# Patient Record
Sex: Female | Born: 1935 | Race: White | Hispanic: No | Smoking: Never smoker
Health system: Southern US, Community
[De-identification: ages and names within clinical notes are randomized; demographics above are authoritative.]

## PROBLEM LIST (undated history)

## (undated) DIAGNOSIS — M1990 Unspecified osteoarthritis, unspecified site: Secondary | ICD-10-CM

## (undated) DIAGNOSIS — G4700 Insomnia, unspecified: Secondary | ICD-10-CM

## (undated) DIAGNOSIS — M79606 Pain in leg, unspecified: Secondary | ICD-10-CM

## (undated) DIAGNOSIS — N1831 Chronic kidney disease, stage 3a: Secondary | ICD-10-CM

## (undated) DIAGNOSIS — M542 Cervicalgia: Secondary | ICD-10-CM

## (undated) DIAGNOSIS — M549 Dorsalgia, unspecified: Secondary | ICD-10-CM

## (undated) HISTORY — DX: Dorsalgia, unspecified: M54.9

## (undated) HISTORY — DX: Pain in leg, unspecified: M79.606

## (undated) HISTORY — DX: Cervicalgia: M54.2

## (undated) HISTORY — DX: Unspecified osteoarthritis, unspecified site: M19.90

---

## 1998-11-17 ENCOUNTER — Other Ambulatory Visit: Admission: RE | Admit: 1998-11-17 | Discharge: 1998-11-17 | Payer: Self-pay | Admitting: Obstetrics and Gynecology

## 2000-05-16 ENCOUNTER — Other Ambulatory Visit: Admission: RE | Admit: 2000-05-16 | Discharge: 2000-05-16 | Payer: Self-pay | Admitting: Obstetrics and Gynecology

## 2005-11-28 ENCOUNTER — Ambulatory Visit: Payer: Self-pay | Admitting: Internal Medicine

## 2005-12-27 ENCOUNTER — Ambulatory Visit: Payer: Self-pay | Admitting: Internal Medicine

## 2007-08-07 ENCOUNTER — Encounter: Admission: RE | Admit: 2007-08-07 | Discharge: 2007-08-07 | Payer: Self-pay | Admitting: Orthopedic Surgery

## 2008-04-10 ENCOUNTER — Encounter: Admission: RE | Admit: 2008-04-10 | Discharge: 2008-04-10 | Payer: Self-pay | Admitting: Endocrinology

## 2008-09-30 ENCOUNTER — Inpatient Hospital Stay (HOSPITAL_COMMUNITY): Admission: RE | Admit: 2008-09-30 | Discharge: 2008-10-03 | Payer: Self-pay | Admitting: Orthopedic Surgery

## 2009-11-07 IMAGING — CR DG CHEST 2V
2 series · 2 of 2 positions shown · non-contrast
Comparison: None

CLINICAL DATA: .  Preop for osteoarthritis right hip

CHEST - 2 VIEW

[w chest pa]
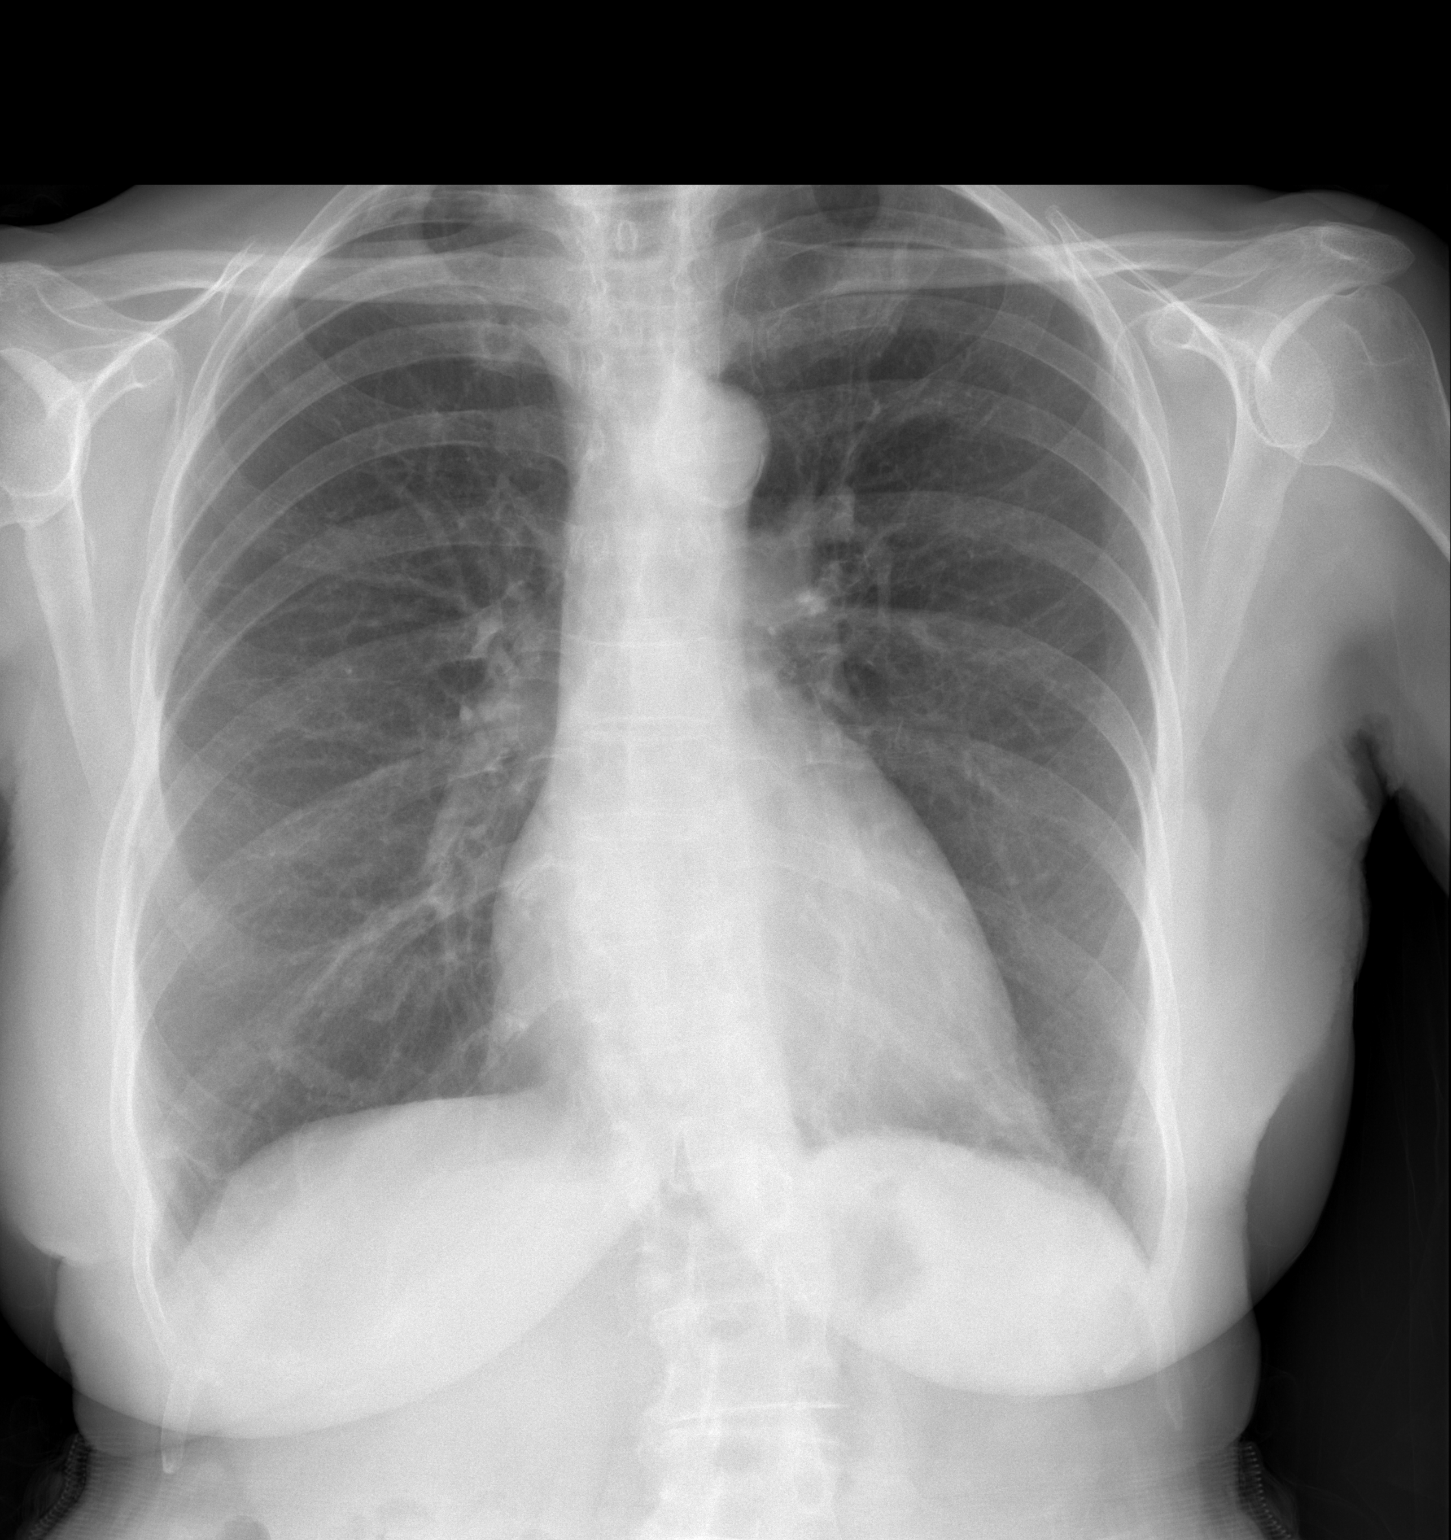

[w chest lat]
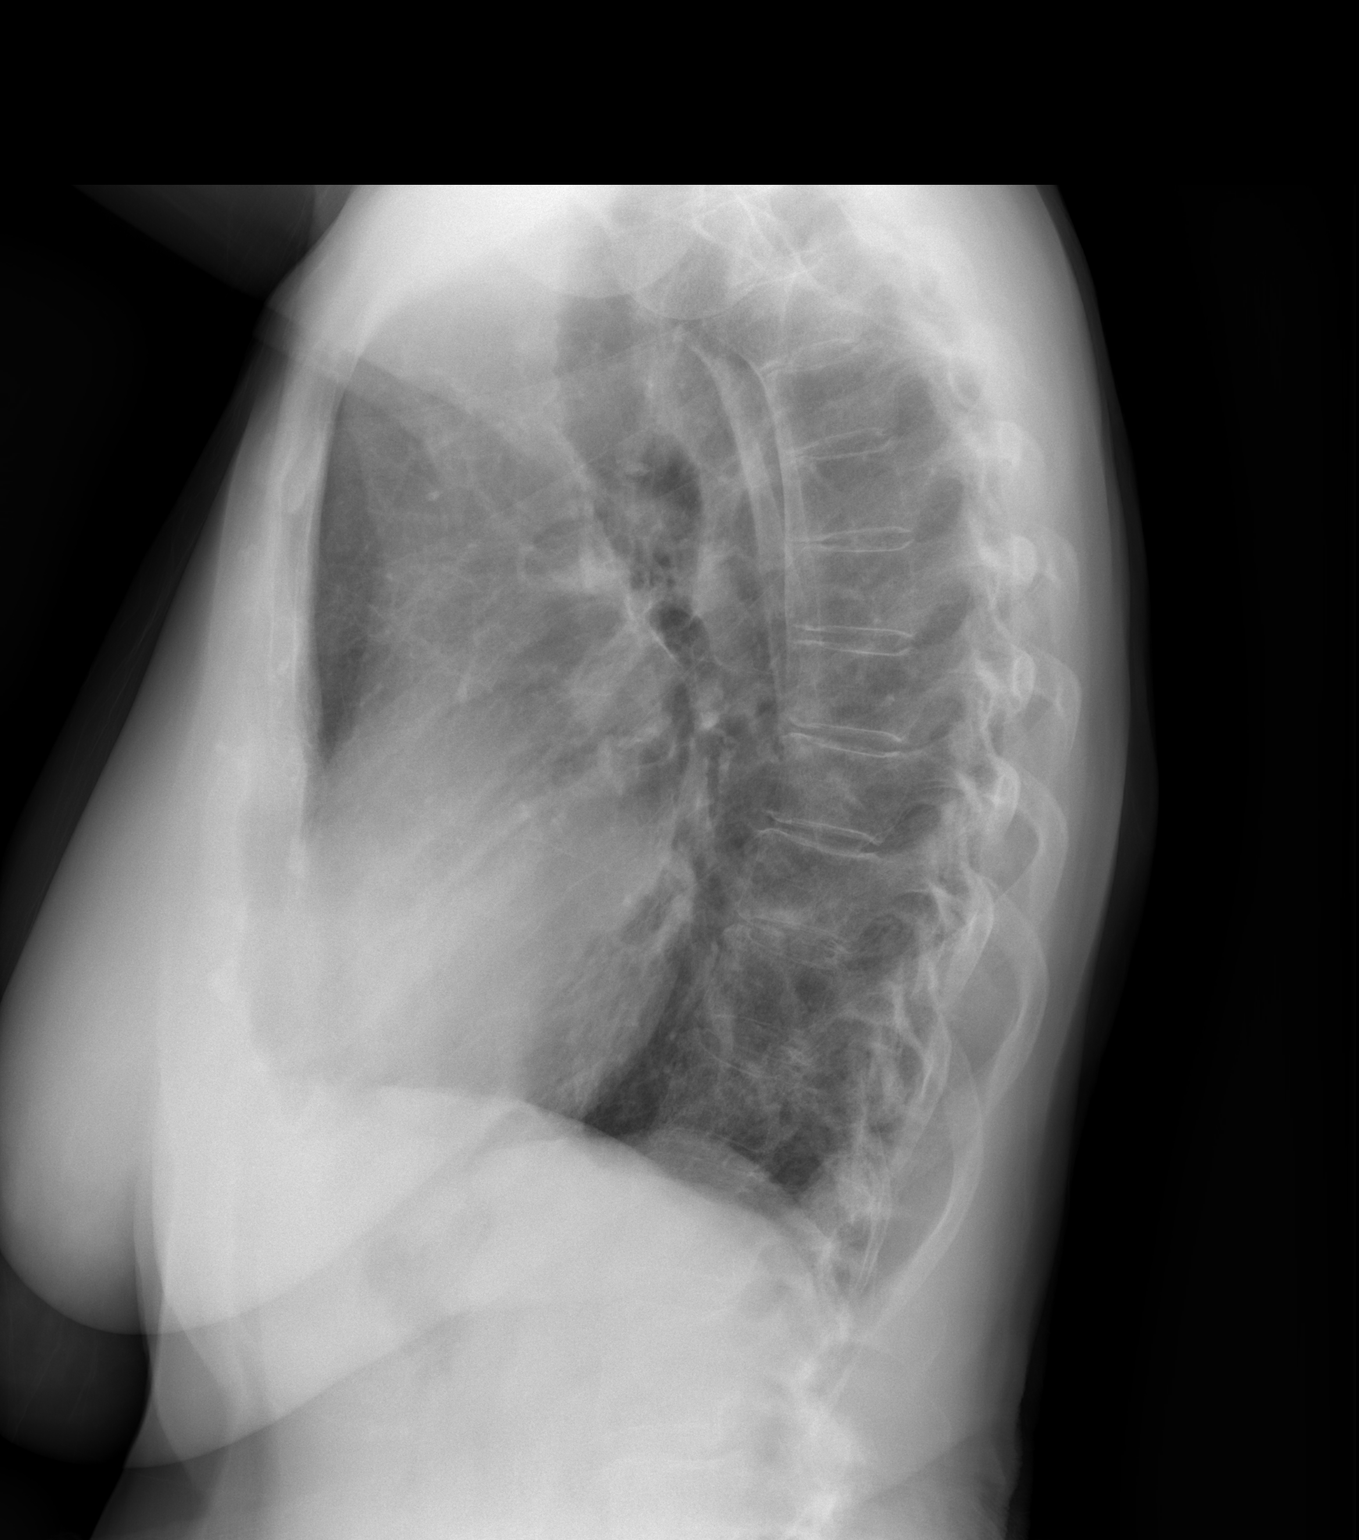

[2 of 2 positions shown; findings below may reference images not displayed]

FINDINGS: The cardiomediastinal silhouette is stable.  No acute
infiltrate or pleural effusion.  No pulmonary edema.  Mild
degenerative changes of the lower thoracic spine are seen.
IMPRESSION: No acute infiltrate or edema.

## 2009-11-13 IMAGING — CR DG PELVIS 1-2V
1 series · 1 of 1 positions shown · non-contrast
Comparison: None

CLINICAL DATA: .  Total right hip replacement

PELVIS - 1-2 VIEW

[view not recorded]
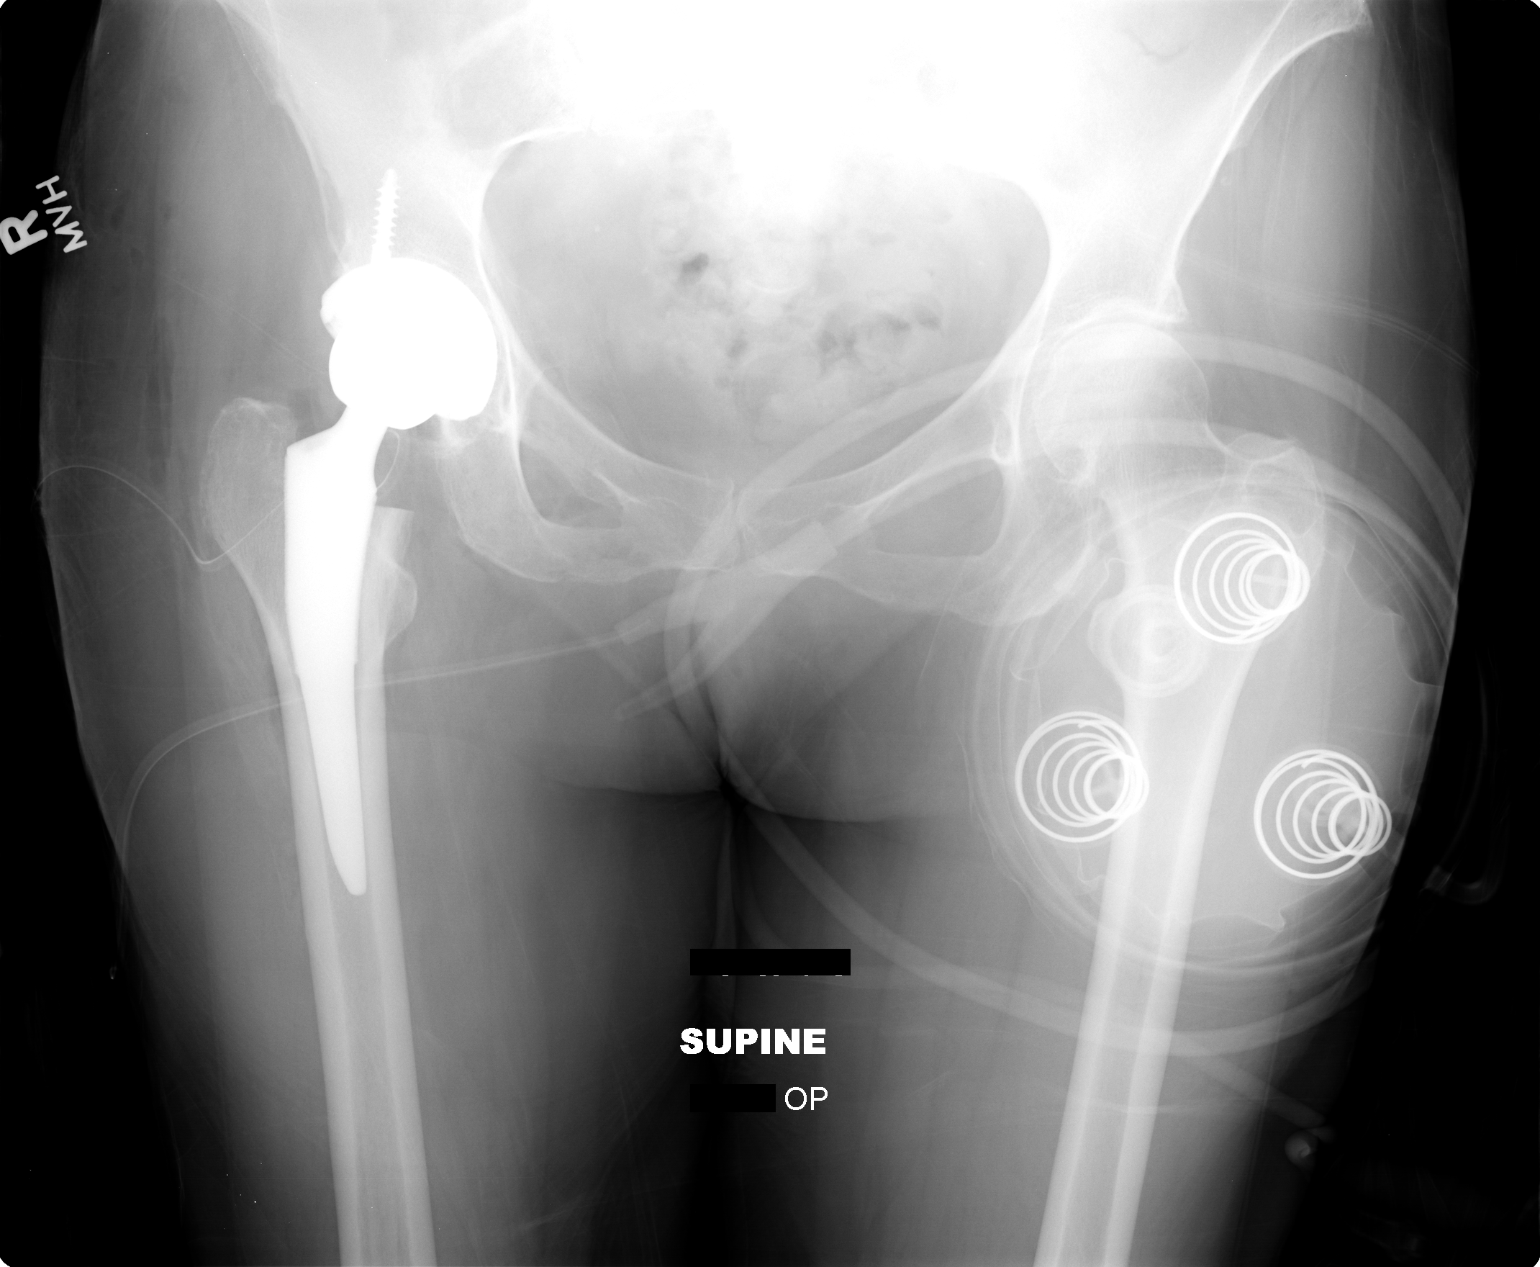

[1 of 1 positions shown; findings below may reference images not displayed]

FINDINGS: Right hip total arthroplasty with the femoral component
and acetabular component well seated.  Surgical drain in place.  No
evidence of fracture.
IMPRESSION: 1.  No evidence of complication following right hip total
arthroplasty.

## 2011-01-02 ENCOUNTER — Encounter: Payer: Self-pay | Admitting: Orthopedic Surgery

## 2011-04-26 NOTE — Op Note (Signed)
NAMEJaselynn, Tamas Hancock                 ACCOUNT NO.:  [id number]   MEDICAL RECORD NO.:  [id number]          PATIENT TYPE:  INP   LOCATION:  1621                         FACILITY:  [facility]   PHYSICIAN:  Madlyn Frankel. Charlann Boxer, M.D.  DATE OF BIRTH:  October 06, 1924   DATE OF PROCEDURE:  09/30/2008  DATE OF DISCHARGE:                               OPERATIVE REPORT   PREOPERATIVE DIAGNOSIS:  Right hip osteoarthritis.   POSTOPERATIVE DIAGNOSIS:  Right hip osteoarthritis.   PROCEDURE:  Right total hip replacement.   COMPONENTS USED:  DePuy hip system size 50 pinnacle cup, single  cancellous screw, 32 +4 Marathon liner, size 3 standard Tri-Lock stem  with 32 +5 ball.   SURGEON:  Madlyn Frankel. Charlann Boxer, M.D.   ASSISTANT:  Dwyane Luo, P.A.-C.   ANESTHESIA:  General.   BLOOD LOSS:  200 mL.   DRAINS:  x1.   SPECIMEN:  None.   INDICATIONS FOR THE PROCEDURE:  Laura Hancock is an 77 year old female  with advanced right hip osteoarthritis recalcitrant to any conservative  efforts.  Risks and benefits of the hip replacement surgery including  infection, DVT, component failure, need for revision surgery for any  reason were discussed and reviewed, and consent was obtained.   PROCEDURE IN DETAIL:  The patient was brought to the operative theater.  Once adequate anesthesia and preoperative antibiotics, Ancef,  administered, the patient was positioned in the left lateral decubitus  position with the right side up.  The right lower extremity was  prescrubbed and prepped and draped in sterile fashion.  A lateral-based  incision was made for posterior approach to the hip.  Sharp dissection  was carried down to the iliotibial band and gluteal fascia.  They were  incised posteriorly for this posterior approach.  The short external  rotators were identified and taken down separate from posterior capsule.  An L capsulotomy was made preserving the posterior leaflet to protect  the sciatic nerve as well as repair anatomically  at the end.  Hip was  dislocated and significant pathology identified.   The neck osteotomy was made based off anatomic landmarks utilizing a  head and neck segment matching the center of the femoral head down to  the neck.  Following the neck osteotomy, I attended to the femur first.  I used a box osteotome to assure laterality.  I then used a starting  drill and hand reamed once and then irrigated the canal to prevent fat  emboli.  I began broaching with a size 1 broach extending my anteversion  at 20 degrees in the femur.  This was slightly more than what her native  was.  I broached up to a size 3 which sat a little bit above the area of  my neck cut and had a good secure fit.   I then packed off the femur at this point to prevent a lot of bleeding.  I then attended to the acetabulum.  Acetabular exposure was obtained,  labrum debrided.  I began reaming with a 43 reamer and reamed up to 49  reamer with good bony  bed preparation.  I impacted a 50-mm cup sitting  at 35-40 degrees of abduction and 15-20 degrees of forward flexion.  Single cancellous screw was utilized to support the initial scratch fit.  A trial 32 +4 liner was placed and trial reduction carried out.  The 3  broach was placed.  The standard neck was placed plus a 32/1.5 ball.   I felt the leg lengths were comparable to that of the down leg as I  measured in the preoperative positioning.  Hip stability was pretty good  with a little bit of subluxation at about 60-70 degrees internal  rotation.   At this point, all trial components were removed.  Final irrigation of  the acetabular shell was carried out with the placement of a hole  eliminator.  The final 32 +4 neutral Marathon liner was then impacted  without difficulty.   The final 3 standard stem was then impacted to the level where the  broach had sat, and I retrialed at this time with a 32 +5 ball.  This  improved the hip stability up to 80 degrees of internal  rotation without  any evidence of subluxation, stable in sleep position.  No evidence of  impingement with external rotation and extension.   The final 32 +5 ball was then impacted with a trunnion.  I did not feel  that jumped from 32/1.5 to 32 +5 ball with significance in terms of the  lengthening and, if anything, just a couple of millimeters.  I felt the  stability was worth it at this point.   At this point, I reapproximated the posterior capsule leaflet to the  superior leaflet using #1 Vicryl.  The remaining wound was closed at  this point over a closed Hemovac drain, #1 Vicryl used on the iliotibial  band and the gluteal fascia.  Two-0 Vicryl was used in the subcutaneous  layer and a running 4-0 Monocryl.  The hip was cleaned, dried and  dressed sterilely with Steri-Strips and a Mepilex dressing.  She was  brought to the recovery room in stable condition, tolerating the  procedure well.      Madlyn Frankel Charlann Boxer, M.D.  Electronically Signed     MDO/MEDQ  D:  09/30/2008  T:  10/01/2008  Job:  [id number]

## 2011-04-26 NOTE — H&P (Signed)
NAMETrinitee, [name]                 ACCOUNT NO.:  [id number]   MEDICAL RECORD NO.:  [id number]          PATIENT TYPE:  INP   LOCATION:                               FACILITY:  [facility]   PHYSICIAN:  Laura Hancock, M.D.  DATE OF BIRTH:  Oct 25, 1924   DATE OF ADMISSION:  09/30/2008  DATE OF DISCHARGE:                              HISTORY & PHYSICAL   PROCEDURE:  Right total hip replacement.   CHIEF COMPLAINT:  Right hip pain.   HISTORY OF PRESENT ILLNESS:  An 77 year old female with a history of  right hip pain secondary to osteoarthritis.  It is refractory to all  conservative treatment.  Patient to be presurgically assessed for hip  replacement by her primary care physician Laura Hancock.   PAST MEDICAL HISTORY:  Significant for:  1. Osteoarthritis.  2. Glaucoma.   PAST SURGICAL HISTORY:  Hysterectomy.   FAMILY MEDICAL HISTORY:  Noncontributory.   SOCIAL HISTORY:  Retired.  Does have primary caregiver in the home for  postoperative period.   DRUG ALLERGIES:  No known drug allergies.   PRIMARY CARE PHYSICIAN:  Laura Hancock, M.D.   CURRENT MEDICATIONS:  1. Oxycodone 5 mg one to two p.o. q.6-8h. p.r.n. pain.  2. Ambien 5 mg p.o. nightly p.r.n. insomnia.   REVIEW OF SYSTEMS:  CARDIOVASCULAR:  She has had recent midsternal chest  pain with other symptoms.  No history of coronary artery disease or  other coronary symptoms  Otherwise, see HPI.   PHYSICAL EXAMINATION:  VITAL SIGNS:  Pulse 72.  Respirations 16.  Blood  pressure 118/74.  GENERAL:  Awake, alert and oriented.  Well-developed, well-nourished.  Height 5 feet 2 inches.  Weight 117 pounds.  NECK:  Supple.  No carotid bruits.  CHEST:  Lungs clear to auscultation bilaterally.  BREASTS:  Deferred.  HEART:  Regular rate and rhythm.  S1, S2 distinct.  ABDOMEN:  Soft, nontender.  Bowel sounds present.  GENITOURINARY:  Deferred.  EXTREMITIES:  Right hip has increased pain with range of motion.  SKIN:  No  cellulitis.  NEUROLOGICAL:  Intact distal sensibilities.   LABORATORY DATA:  Labs, EKG, chest x-ray are pending presurgical  testing.   IMPRESSION:  Right hip osteoarthritis, failing conservative measures.   PLAN OF ACTION:  Right total hip replacement at [facility] on  September 30, 2008 by surgeon Dr. Durene Hancock.  Risks and complications  were discussed.   Postoperative medications including Lovenox, Robaxin, iron, aspirin,  MiraLax, Colace provided at the time of history and physical.  Pain  medicines will be provided at the time of surgery.     ______________________________  Laura Hancock, [place]      Laura Hancock, M.D.  Electronically Signed    BLM/MEDQ  D:  09/18/2008  T:  09/18/2008  Job:  [id number]   cc:   Laura Hancock, M.D.  Fax: [phone]

## 2011-04-29 NOTE — Discharge Summary (Signed)
NAMELavell, Laura Hancock                 ACCOUNT NO.:  [id number]   MEDICAL RECORD NO.:  [id number]          PATIENT TYPE:  INP   LOCATION:  1621                         FACILITY:  [facility]   PHYSICIAN:  Laura Hancock. Laura Hancock, M.D.  DATE OF BIRTH:  [birth year]   DATE OF ADMISSION:  09/30/2008  DATE OF DISCHARGE:  10/03/2008                               DISCHARGE SUMMARY   ADMITTING DIAGNOSES:  1. Osteoarthritis.  2. Glaucoma.   DISCHARGE DIAGNOSES:  1. Osteoarthritis.  2. Glaucoma.   HISTORY OF PRESENT ILLNESS:  An 77 year old female with a history of  right hip pain secondary to osteoarthritis.  It was refractory to all  conservative treatment.  She was presurgically assessed by primary care  physician, Dr. Laurene Footman and provided cardiology clearance by Dr.  Lady Deutscher, with [facility] prior to surgery.   CONSULTANTS:  None.   PROCEDURE:  Right total hip replacement by surgeon Dr. Durene Romans,  assistant Dwyane Luo, PA.   LABORATORY DATA:  CBC checked throughout her course of stay.  Final  readings:  White blood cell 8.2, hemoglobin 10, hematocrit 29.3, MCV 103  and her platelets 125.  White cell differential all within normal  limits.  Her coagulation all within normal limits.  Metabolic panel  final reading:  Sodium 138, potassium 3.6, glucose 114, creatinine 0.71.  Her calcium was 8.8.  Kidney function GFR greater than 60.  Her UA on  November 14 showed a few bacteria treated preoperatively.   RADIOLOGY:  Chest two-view showed no acute inferior edema.   CARDIOLOGY:  Full cardiology clearance provided prior to surgery, had a  normal nuclear stress study.   HOSPITAL COURSE:  The patient underwent right total hip replacement and  admitted to orthopedic floor.  On day #1, no events.  No complaints.  Hemodynamically stable.  Dressing was dry.  Hemovac was discontinued.  She was started on physical therapy, weightbearing as tolerated.  Seen  on day #2, she had  had some nausea, but it was improved by that morning.  She is afebrile, hemodynamically stable.  Wound had no active drainage.  She was neurovascular intact making good progress with physical therapy.   ASSESSMENT:  It was determined that she a candidate for home health  care.  Seen on day #3, doing fine.  No events, afebrile.  Met all  criteria for discharge home.   DISCHARGE DISPOSITION:  Discharged home in stable improved condition.   DISCHARGE INSTRUCTIONS:  1. Physical Therapy.  She is weightbearing as tolerated with use of      rolling walker with home health care physical therapy selected.  2. Diet.  Regular.  3. Wound Care.  Keep dry.   DISCHARGE MEDICATIONS:  1. Lovenox 40 mg subcu q. 24 times 11 days.  2. Robaxin 500 mg p.o. q.6 h p.r.n. muscle spasm pain.  3. Enteric-coated aspirin 320 mg one p.o. daily x4 weeks after Lovenox      completed.  4. Iron 325 mg one p.o. t.i.d. x3 weeks.  5. Colace 100 mg p.o. b.i.d. p.r.n. constipation while on  narcotics.  6. MiraLax 17 grams p.o. daily p.r.n. constipation while on narcotics.  7. Oxycodone 5 mg 1-3 p.o. q. 3-4 p.r.n. pain.  8. Celebrex 200 mg one p.o. daily.  9. Zolpidem 10 mg one p.o. q.h.s. p.r.n. insomnia.  10.Multivitamin one p.o. daily.  11.Calcium plus D p.o. b.i.d.  12.Fish oil, hold for 2 weeks.   DISCHARGE FOLLOWUP:  Follow up with Dr. Charlann Hancock at phone number [phone] in  two weeks for wound check.     ______________________________  Laura Hancock. Laura Hancock, Laura Hancock      Laura Hancock. Laura Hancock, M.D.  Electronically Signed    BLM/MEDQ  D:  11/04/2008  T:  11/04/2008  Job:  [id number]   cc:   Jeannett Senior A. Evlyn Kanner, M.D.  Fax: [phone]   Elmore Guise., M.D.  Fax: [phone]

## 2011-09-13 LAB — DIFFERENTIAL
Basophils Absolute: 0
Eosinophils Absolute: 0.1
Eosinophils Relative: 2
Lymphocytes Relative: 26
Lymphs Abs: 1.4
Neutro Abs: 3.4

## 2011-09-13 LAB — URINALYSIS, ROUTINE W REFLEX MICROSCOPIC
Bilirubin Urine: NEGATIVE
Nitrite: NEGATIVE
Urobilinogen, UA: 0.2
pH: 7.5

## 2011-09-13 LAB — CBC
HCT: 32.9 — ABNORMAL LOW
HCT: 45.1
Hemoglobin: 15.4 — ABNORMAL HIGH
MCHC: 34.6
MCV: 101.8 — ABNORMAL HIGH
MCV: 102.3 — ABNORMAL HIGH
RBC: 2.87 — ABNORMAL LOW
RBC: 3.23 — ABNORMAL LOW
RBC: 4.43
RDW: 11.8
RDW: 12
RDW: 12
WBC: 5.4

## 2011-09-13 LAB — URINE MICROSCOPIC-ADD ON

## 2011-09-13 LAB — BASIC METABOLIC PANEL
BUN: 13
BUN: 7
Chloride: 101
Creatinine, Ser: 0.73
GFR calc Af Amer: >60
GFR calc non Af Amer: >60
GFR calc non Af Amer: >60
Glucose, Bld: 111 — ABNORMAL HIGH
Glucose, Bld: 114 — ABNORMAL HIGH
Potassium: 4
Sodium: 135
Sodium: 138

## 2011-09-13 LAB — PROTIME-INR: Prothrombin Time: 12.5

## 2011-09-13 LAB — APTT: aPTT: 24

## 2011-09-13 LAB — TYPE AND SCREEN: ABO/RH(D): B POS

## 2012-03-16 ENCOUNTER — Other Ambulatory Visit: Payer: Self-pay | Admitting: Dermatology

## 2012-06-08 ENCOUNTER — Ambulatory Visit: Payer: Medicare Other | Attending: Endocrinology | Admitting: Physical Therapy

## 2012-06-08 DIAGNOSIS — IMO0001 Reserved for inherently not codable concepts without codable children: Secondary | ICD-10-CM | POA: Insufficient documentation

## 2012-06-08 DIAGNOSIS — M25559 Pain in unspecified hip: Secondary | ICD-10-CM | POA: Insufficient documentation

## 2012-06-08 DIAGNOSIS — M2560 Stiffness of unspecified joint, not elsewhere classified: Secondary | ICD-10-CM | POA: Insufficient documentation

## 2013-07-16 ENCOUNTER — Other Ambulatory Visit: Payer: Self-pay | Admitting: Dermatology

## 2013-08-09 ENCOUNTER — Other Ambulatory Visit: Payer: Self-pay | Admitting: Dermatology

## 2017-10-03 ENCOUNTER — Other Ambulatory Visit: Payer: Self-pay | Admitting: Endocrinology

## 2017-10-03 DIAGNOSIS — R55 Syncope and collapse: Secondary | ICD-10-CM

## 2017-10-03 DIAGNOSIS — R0989 Other specified symptoms and signs involving the circulatory and respiratory systems: Secondary | ICD-10-CM

## 2017-10-04 ENCOUNTER — Other Ambulatory Visit: Payer: Self-pay | Admitting: Endocrinology

## 2017-10-04 DIAGNOSIS — R55 Syncope and collapse: Secondary | ICD-10-CM

## 2017-10-09 ENCOUNTER — Ambulatory Visit
Admission: RE | Admit: 2017-10-09 | Discharge: 2017-10-09 | Disposition: A | Discharge status: Home / Self Care | Payer: Medicare Other | Source: Ambulatory Visit | Attending: Endocrinology | Admitting: Endocrinology

## 2017-10-09 DIAGNOSIS — R55 Syncope and collapse: Secondary | ICD-10-CM

## 2017-10-09 DIAGNOSIS — R0989 Other specified symptoms and signs involving the circulatory and respiratory systems: Secondary | ICD-10-CM

## 2017-11-05 IMAGING — US US CAROTID DUPLEX BILAT
1 series · 13 of 24 positions shown · non-contrast
Comparison: None.

CLINICAL DATA: Asymptomatic right carotid bruit, syncope

EXAM:
BILATERAL CAROTID DUPLEX ULTRASOUND
TECHNIQUE: Gray scale imaging, color Doppler and duplex ultrasound were
performed of bilateral carotid and vertebral arteries in the neck.

[Series 1: us carotid duplex bilat · 0.06mm/px · 13 of 48 slices shown]
[im 1/48]
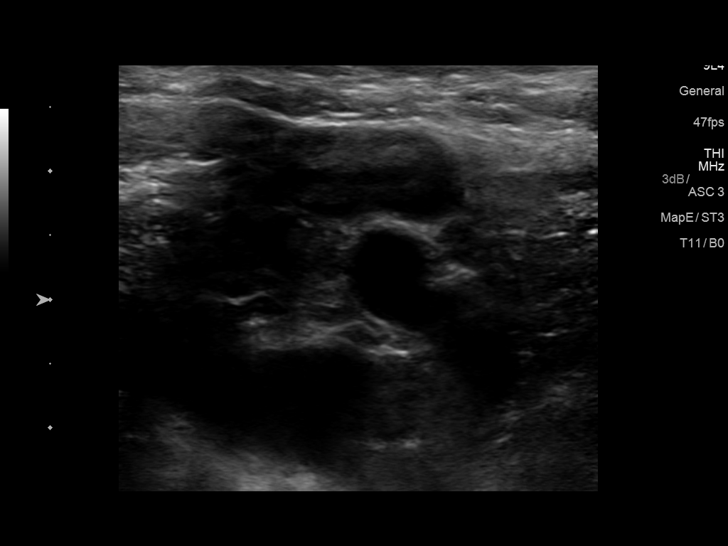
[im 5/48]
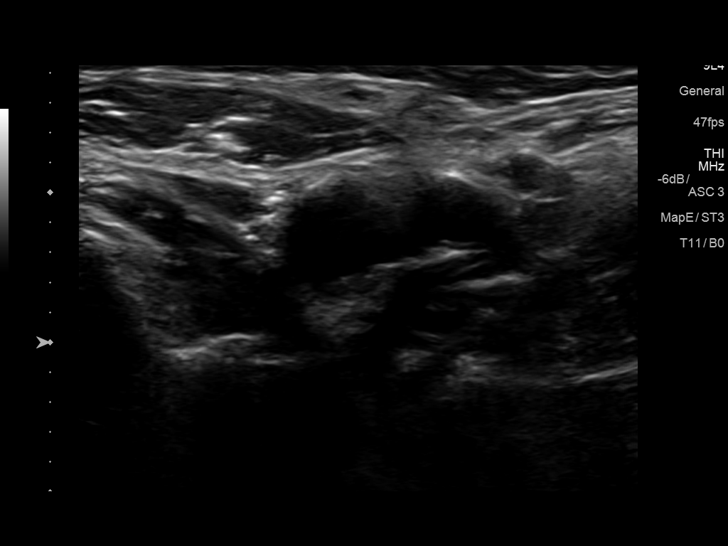
[im 9/48]
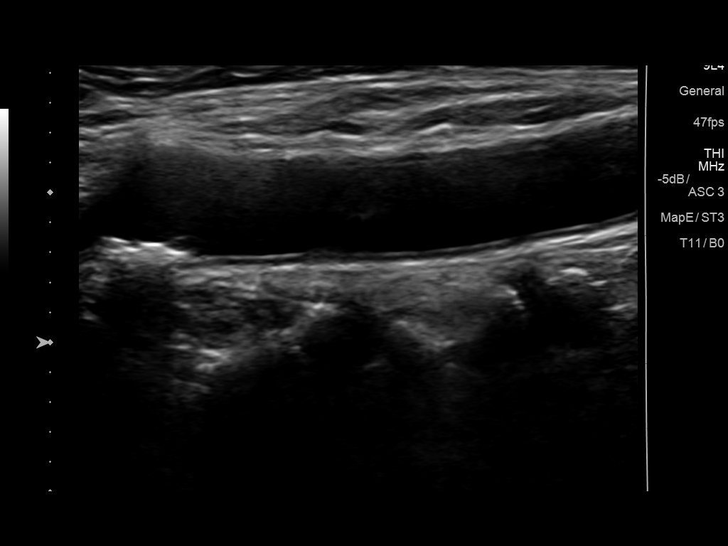
[im 13/48]
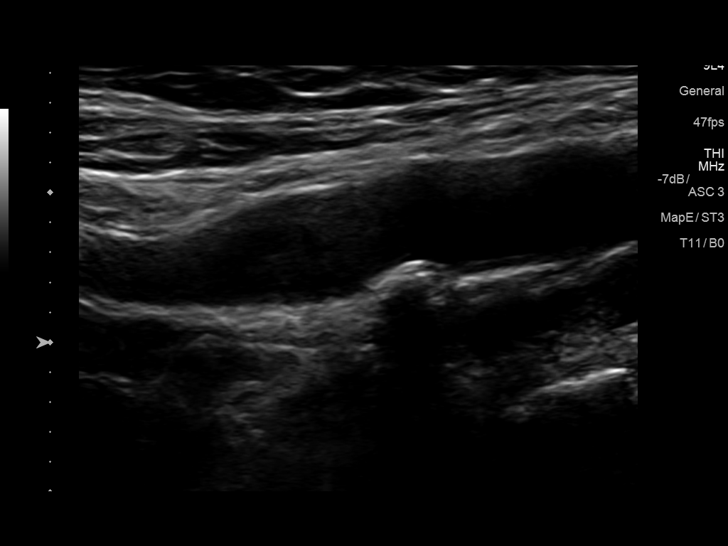
[im 17/48]
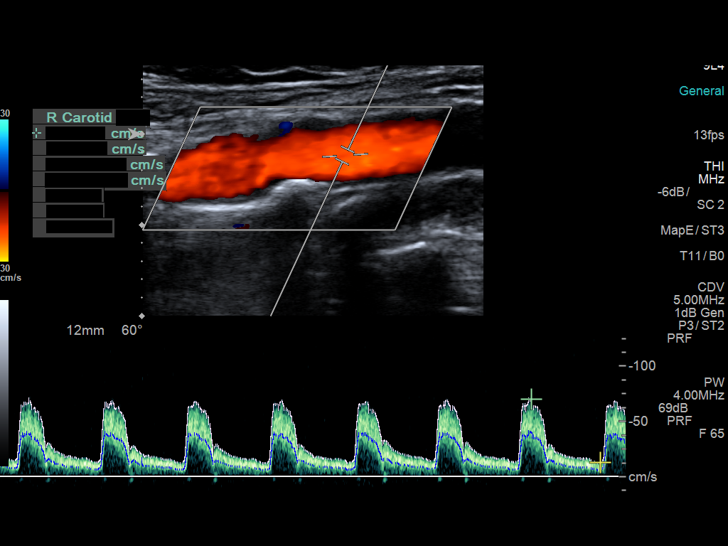
[im 21/48]
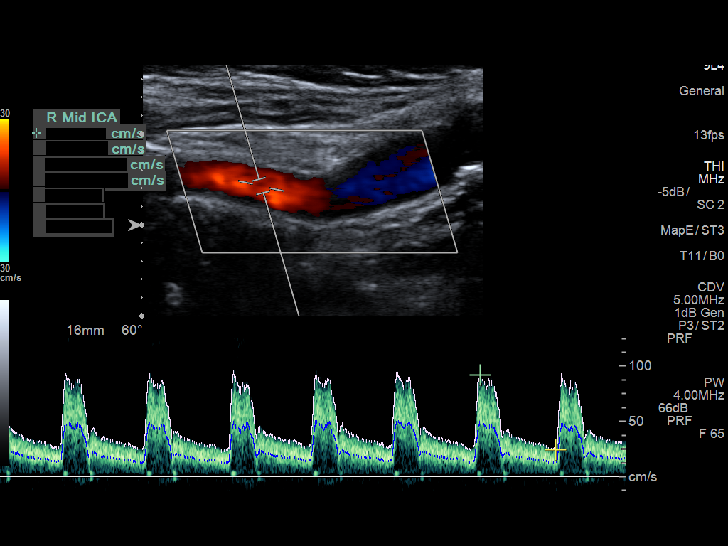
[im 25/48]
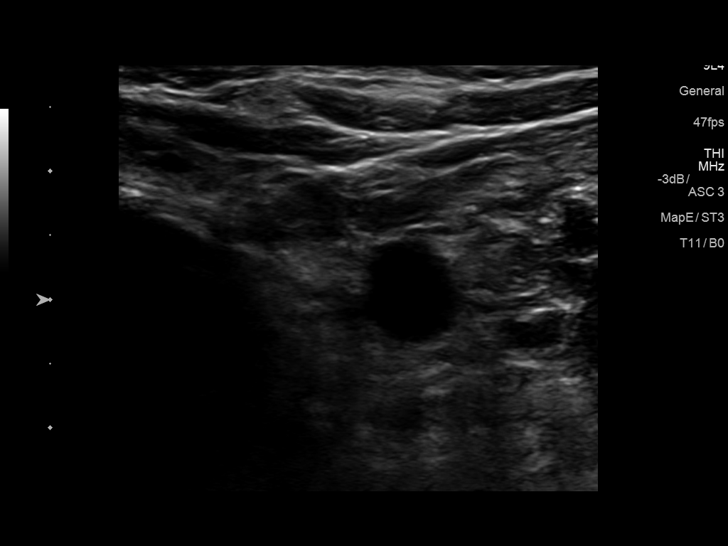
[im 27/48]
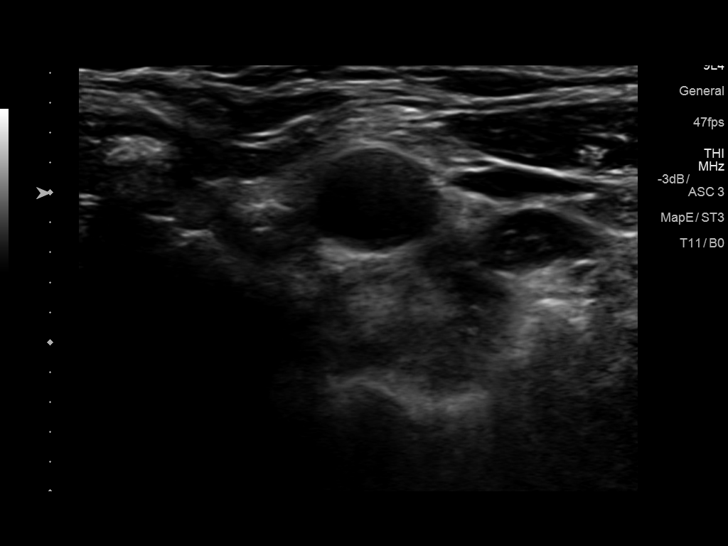
[im 31/48]
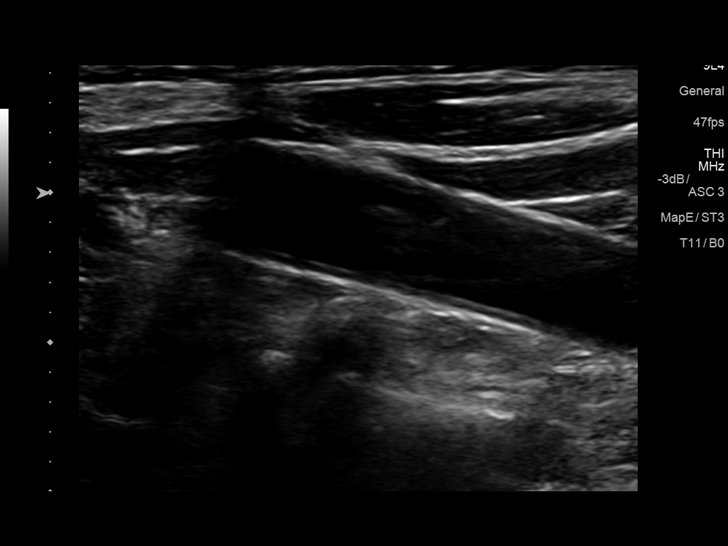
[im 35/48]
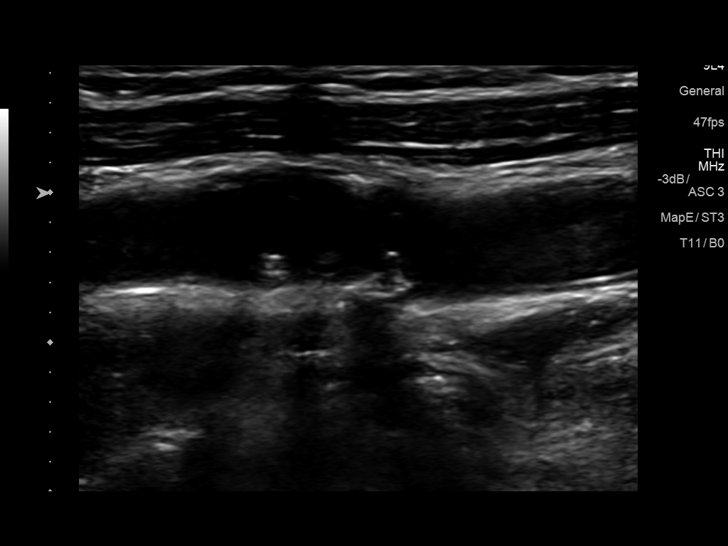
[im 39/48]
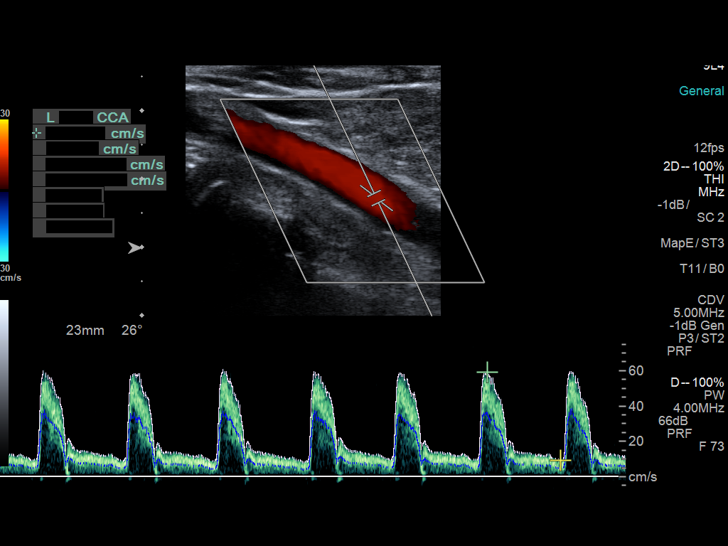
[im 43/48]
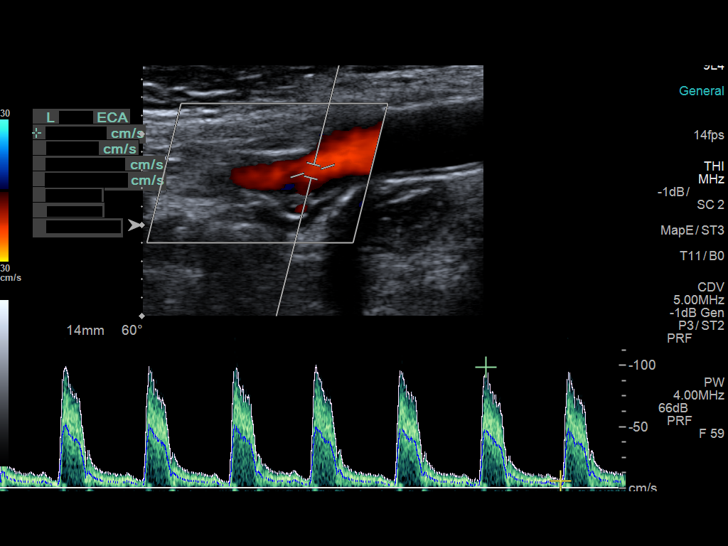
[im 48/48]
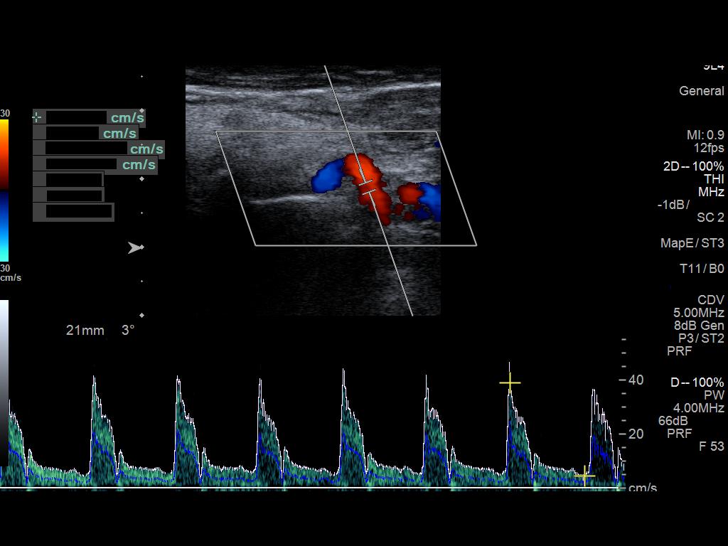

[13 of 24 positions shown; findings below may reference images not displayed]

FINDINGS: Criteria: Quantification of carotid stenosis is based on velocity
parameters that correlate the residual internal carotid diameter
with NASCET-based stenosis levels, using the diameter of the distal
internal carotid lumen as the denominator for stenosis measurement.

The following velocity measurements were obtained:

RIGHT

ICA:  92/24 cm/sec

CCA:  94/12 cm/sec

SYSTOLIC ICA/CCA RATIO:

DIASTOLIC ICA/CCA RATIO:

ECA:  57 cm/sec

LEFT

ICA:  89/24 cm/sec

CCA:  73/14 cm/sec

SYSTOLIC ICA/CCA RATIO:

DIASTOLIC ICA/CCA RATIO:

ECA:  98 cm/sec

RIGHT CAROTID ARTERY: Minor echogenic shadowing plaque formation. No
hemodynamically significant right ICA stenosis, velocity elevation,
or turbulent flow. Degree of narrowing less than 50%.

RIGHT VERTEBRAL ARTERY:  Antegrade

LEFT CAROTID ARTERY: Similar scattered minor echogenic plaque
formation. No hemodynamically significant left ICA stenosis,
velocity elevation, or turbulent flow.

LEFT VERTEBRAL ARTERY:  Antegrade
IMPRESSION: Mild bilateral carotid atherosclerosis. No hemodynamically
significant ICA stenosis. Degree of narrowing less than 50%
bilaterally by ultrasound criteria.

Patent antegrade vertebral flow bilaterally

## 2020-01-05 ENCOUNTER — Encounter (HOSPITAL_COMMUNITY): Payer: Self-pay

## 2020-01-05 ENCOUNTER — Emergency Department (HOSPITAL_COMMUNITY): Payer: Medicare Other

## 2020-01-05 ENCOUNTER — Emergency Department (HOSPITAL_COMMUNITY)
Admission: EM | Admit: 2020-01-05 | Discharge: 2020-01-05 | Disposition: A | Discharge status: Home / Self Care | Payer: Medicare Other | Attending: Emergency Medicine | Admitting: Emergency Medicine

## 2020-01-05 ENCOUNTER — Other Ambulatory Visit: Payer: Self-pay

## 2020-01-05 DIAGNOSIS — M79662 Pain in left lower leg: Secondary | ICD-10-CM | POA: Diagnosis present

## 2020-01-05 DIAGNOSIS — L03116 Cellulitis of left lower limb: Secondary | ICD-10-CM | POA: Insufficient documentation

## 2020-01-05 LAB — CBC WITH DIFFERENTIAL/PLATELET
Abs Immature Granulocytes: 0.01 10*3/uL (ref 0.00–0.07)
Basophils Absolute: 0 10*3/uL (ref 0.0–0.1)
Basophils Relative: 1 %
Eosinophils Absolute: 0.1 10*3/uL (ref 0.0–0.5)
Eosinophils Relative: 2 %
HCT: 41.1 % (ref 36.0–46.0)
Hemoglobin: 13.6 g/dL (ref 12.0–15.0)
Immature Granulocytes: 0 %
Lymphocytes Relative: 25 %
Lymphs Abs: 1.3 10*3/uL (ref 0.7–4.0)
MCH: 34.3 pg — ABNORMAL HIGH (ref 26.0–34.0)
MCHC: 33.1 g/dL (ref 30.0–36.0)
MCV: 103.8 fL — ABNORMAL HIGH (ref 80.0–100.0)
Monocytes Absolute: 0.4 10*3/uL (ref 0.1–1.0)
Monocytes Relative: 8 %
Neutro Abs: 3.5 10*3/uL (ref 1.7–7.7)
Neutrophils Relative %: 64 %
Platelets: 192 10*3/uL (ref 150–400)
RBC: 3.96 MIL/uL (ref 3.87–5.11)
RDW: 11.9 % (ref 11.5–15.5)
WBC: 5.4 10*3/uL (ref 4.0–10.5)
nRBC: 0 % (ref 0.0–0.2)

## 2020-01-05 MED ORDER — DOXYCYCLINE HYCLATE 100 MG PO CAPS: 100.0000 mg | ORAL_CAPSULE | Freq: Two times a day (BID) | ORAL | 0 refills | Status: DC

## 2020-01-05 MED ORDER — HYDROCODONE-ACETAMINOPHEN 5-325 MG PO TABS
1.0000 | ORAL_TABLET | Freq: Once | ORAL | Status: AC
  Administered 2020-01-05: 13:00:00 1 via ORAL
  Filled 2020-01-05: qty 1

## 2020-01-05 NOTE — ED Provider Notes (Signed)
[facility] DEPT Provider Note   CSN: [id number] Arrival date & time: 01/05/20  1209     History Chief Complaint  Patient presents with  . Bleeding/Bruising    Laura Hancock is a 86 y.o. female.  86 year old female presents with 3 days of pain and swelling to her left lower extremity.  Denies any trauma.  No fever or chills.  Has been using cortisone without relief.  States the pain is localized to her left distal anterior tibia.  Denies any other bruising or joint pain.  No prior history of same.        No past medical history on file.  There are no problems to display for this patient.      OB History   No obstetric history on file.     No family history on file.  Social History   Tobacco Use  . Smoking status: Not on file  Substance Use Topics  . Alcohol use: Not on file  . Drug use: Not on file    Home Medications Prior to Admission medications   Not on File    Allergies    Patient has no known allergies.  Review of Systems   Review of Systems  All other systems reviewed and are negative.   Physical Exam Updated Vital Signs BP (!) 145/66 (BP Location: Left Arm)   Pulse 67   Temp 98 F (36.7 C) (Oral)   Resp 16   Ht 1.575 m (5\' 2" )   Wt 50.8 kg   SpO2 99%   BMI 20.49 kg/m   Physical Exam Vitals and nursing note reviewed.  Constitutional:      General: She is not in acute distress.    Appearance: Normal appearance. She is well-developed. She is not toxic-appearing.  HENT:     Head: Normocephalic and atraumatic.  Eyes:     General: Lids are normal.     Conjunctiva/sclera: Conjunctivae normal.     Pupils: Pupils are equal, round, and reactive to light.  Neck:     Thyroid: No thyroid mass.     Trachea: No tracheal deviation.  Cardiovascular:     Rate and Rhythm: Normal rate and regular rhythm.     Heart sounds: Normal heart sounds. No murmur. No gallop.   Pulmonary:     Effort: Pulmonary effort is  normal. No respiratory distress.     Breath sounds: Normal breath sounds. No stridor. No decreased breath sounds, wheezing, rhonchi or rales.  Abdominal:     General: Bowel sounds are normal. There is no distension.     Palpations: Abdomen is soft.     Tenderness: There is no abdominal tenderness. There is no rebound.  Musculoskeletal:        General: No tenderness. Normal range of motion.     Cervical back: Normal range of motion and neck supple.       Legs:  Skin:    General: Skin is warm and dry.     Findings: No abrasion or rash.  Neurological:     Mental Status: She is alert and oriented to person, place, and time.     GCS: GCS eye subscore is 4. GCS verbal subscore is 5. GCS motor subscore is 6.     Cranial Nerves: No cranial nerve deficit.     Sensory: No sensory deficit.  Psychiatric:        Speech: Speech normal.        Behavior: Behavior normal.  ED Results / Procedures / Treatments   Labs (all labs ordered are listed, but only abnormal results are displayed) Labs Reviewed - No data to display  EKG None  Radiology No results found.  Procedures Procedures (including critical care time)  Medications Ordered in ED Medications  HYDROcodone-acetaminophen (NORCO/VICODIN) 5-325 MG per tablet 1 tablet (has no administration in time range)    ED Course  I have reviewed the triage vital signs and the nursing notes.  Pertinent labs & imaging results that were available during my care of the patient were reviewed by me and considered in my medical decision making (see chart for details).    MDM Rules/Calculators/A&P                      Cbc wnl Xray of tib-fib negative.  Suspect cellulitis will place on doxycycline return precautions given Final Clinical Impression(s) / ED Diagnoses Final diagnoses:  None    Rx / DC Orders ED Discharge Orders    None       Lacretia Leigh, MD 01/05/20 1345

## 2020-01-05 NOTE — ED Triage Notes (Signed)
She reports an area of bruising at let. Mid left calf area. This area is not warm, nor hot to touch; and distal CMS is WDL. She denies any known injury and states she is otherwise healthy. The area is 6cm x 7 cm.

## 2020-01-17 ENCOUNTER — Ambulatory Visit (HOSPITAL_COMMUNITY)
Admission: RE | Admit: 2020-01-17 | Discharge: 2020-01-17 | Disposition: A | Discharge status: Home / Self Care | Payer: Medicare Other | Source: Ambulatory Visit | Attending: Vascular Surgery | Admitting: Vascular Surgery

## 2020-01-17 ENCOUNTER — Other Ambulatory Visit: Payer: Self-pay

## 2020-01-17 ENCOUNTER — Other Ambulatory Visit (HOSPITAL_COMMUNITY): Payer: Self-pay | Admitting: Adult Health

## 2020-01-17 DIAGNOSIS — M79605 Pain in left leg: Secondary | ICD-10-CM | POA: Diagnosis not present

## 2020-02-26 ENCOUNTER — Other Ambulatory Visit: Payer: Self-pay

## 2020-02-26 ENCOUNTER — Encounter: Payer: Self-pay | Admitting: Internal Medicine

## 2020-02-26 ENCOUNTER — Non-Acute Institutional Stay: Payer: Medicare Other | Admitting: Internal Medicine

## 2020-02-26 VITALS — BP 112/72 | HR 76 | Temp 98.0°F | Ht 62.0 in | Wt 113.9 lb

## 2020-02-26 DIAGNOSIS — F339 Major depressive disorder, recurrent, unspecified: Secondary | ICD-10-CM | POA: Diagnosis not present

## 2020-02-26 DIAGNOSIS — M25552 Pain in left hip: Secondary | ICD-10-CM

## 2020-02-26 DIAGNOSIS — G4700 Insomnia, unspecified: Secondary | ICD-10-CM

## 2020-02-26 DIAGNOSIS — F419 Anxiety disorder, unspecified: Secondary | ICD-10-CM

## 2020-02-26 DIAGNOSIS — N390 Urinary tract infection, site not specified: Secondary | ICD-10-CM

## 2020-02-26 DIAGNOSIS — Z23 Encounter for immunization: Secondary | ICD-10-CM

## 2020-02-26 MED ORDER — TETANUS-DIPHTH-ACELL PERTUSSIS 5-2.5-18.5 LF-MCG/0.5 IM SUSP: 0.5000 mL | Freq: Once | INTRAMUSCULAR | 0 refills | Status: AC

## 2020-02-26 MED ORDER — SHINGRIX 50 MCG/0.5ML IM SUSR: 0.5000 mL | Freq: Once | INTRAMUSCULAR | 1 refills | Status: AC

## 2020-02-26 NOTE — Progress Notes (Signed)
Location:  [place] of Service:  Clinic (12)  Provider:   Code Status:  Goals of Care:  Advanced Directives 01/05/2020  Does Patient Have a Medical Advance Directive? Yes  Type of Advance Directive Out of facility DNR (pink MOST or yellow form)  Does patient want to make changes to medical advance directive? No - Patient declined  Would patient like information on creating a medical advance directive? No - Patient declined  Pre-existing out of facility DNR order (yellow form or pink MOST form) Yellow form placed in chart (order not valid for inpatient use)     Chief Complaint  Patient presents with  . New Admit To SNF    Patient here today to establish care.     HPI: Patient is a 86 y.o. female seen today for medical management of chronic diseases.  Patient has h/o Left Hip Arthritis, Insomnia, Falls, Hard of hearing and Depression with Anxiety  She was Dr Forde Dandy Patient but now wants to establish care with [place]. Wants to know if we can refill her Meds Her pain seems controlled on Norco 1-2 times a day She also uses Ambien at night .  Her daughter was with her today for Appointment.  Per daughter patient still drives and take care of her ADLS and IADLS.  She also mentioned how she takes her Vodka shot in the evening No Memory issues.  Does water Exercise in [employer] 3 Daughters. 2 in town    Past Medical History:  Diagnosis Date  . Arthritis   . Back pain   . Leg pain   . Neck pain     History reviewed. No pertinent surgical history.  No Known Allergies  Outpatient Encounter Medications as of 02/26/2020  Medication Sig  . AZO CRANBERRY GUMMIES PO Take by mouth. 2 each day  . calcium carbonate (OSCAL) 1500 (600 Ca) MG TABS tablet Take 1,500 mg by mouth daily at 6 (six) AM.  . citalopram (CELEXA) 10 MG tablet Take 10 mg by mouth daily.  Marland Kitchen HYDROcodone-acetaminophen (NORCO/VICODIN) 5-325 MG tablet Take 1 tablet by mouth every 8 (eight) hours.  .  Multiple Vitamin (MULTIVITAMIN) tablet Take 1 tablet by mouth daily.  . naproxen (NAPROSYN) 500 MG tablet Take 500 mg by mouth as needed.  . Tdap (BOOSTRIX) 5-2.5-18.5 LF-MCG/0.5 injection Inject 0.5 mLs into the muscle once.  Marland Kitchen zolpidem (AMBIEN) 5 MG tablet Take 5 mg by mouth daily.  . [DISCONTINUED] Zoster Vaccine Adjuvanted [facility]) injection Inject 0.5 mLs into the muscle once.  . [DISCONTINUED] Phenazopyridine HCl (AZO TABS PO) Take by mouth as needed.   No facility-administered encounter medications on file as of 02/26/2020.    Review of Systems:  Review of Systems  Review of Systems  Constitutional: Negative for activity change, appetite change, chills, diaphoresis, fatigue and fever.  HENT: Negative for mouth sores, postnasal drip, rhinorrhea, sinus pain and sore throat.   Respiratory: Negative for apnea, cough, chest tightness, shortness of breath and wheezing.   Cardiovascular: Negative for chest pain, palpitations and leg swelling.  Gastrointestinal: Negative for abdominal distention, abdominal pain, constipation, diarrhea, nausea and vomiting.  Genitourinary: Negative for dysuria and frequency.  Musculoskeletal: Negative for arthralgias, joint swelling and myalgias. Has Left hip  Pain  Skin: Negative for rash.  Neurological: Negative for dizziness, syncope, weakness, light-headedness and numbness.  Psychiatric/Behavioral: Negative for behavioral problems, confusion and sleep disturbance.     Health Maintenance  Topic Date Due  . TETANUS/TDAP  Never done  . DEXA SCAN  Never done  . PNA vac Low Risk Adult (1 of 2 - PCV13) Never done  . INFLUENZA VACCINE  Completed    Physical Exam: Vitals:   02/26/20 1502  BP: 112/72  Pulse: 76  Temp: 98 F (36.7 C)  SpO2: 98%  Weight: 113 lb 14.1 oz (51.7 kg)  Height: 5\' 2"  (1.575 m)   Body mass index is 20.83 kg/m. Physical Exam  Constitutional: Oriented to person, place, and time. Well-developed and well-nourished.    HENT:  Head: Normocephalic.  Mouth/Throat: Oropharynx is clear and moist.  Eyes: Pupils are equal, round, and reactive to light.  Neck: Neck supple.  Cardiovascular: Normal rate and normal heart sounds.  No murmur heard. Pulmonary/Chest: Effort normal and breath sounds normal. No respiratory distress. No wheezes. She has no rales.  Abdominal: Soft. Bowel sounds are normal. No distension. There is no tenderness. There is no rebound.  Musculoskeletal: No edema. Gait was stable. Does Need her Walker and Limps on Left Leg Lymphadenopathy: none Neurological: Alert and oriented to person, place, and time.  Skin: Skin is warm and dry.  Psychiatric: Normal mood and affect. Behavior is normal. Thought content normal.    Labs reviewed: Basic Metabolic Panel: No results for input(s): NA, K, CL, CO2, GLUCOSE, BUN, CREATININE, CALCIUM, MG, PHOS, TSH in the last 8760 hours. Liver Function Tests: No results for input(s): AST, ALT, ALKPHOS, BILITOT, PROT, ALBUMIN in the last 8760 hours. No results for input(s): LIPASE, AMYLASE in the last 8760 hours. No results for input(s): AMMONIA in the last 8760 hours. CBC: Recent Labs    01/05/20 1231  WBC 5.4  NEUTROABS 3.5  HGB 13.6  HCT 41.1  MCV 103.8*  PLT 192   Lipid Panel: No results for input(s): CHOL, HDL, LDLCALC, TRIG, CHOLHDL, LDLDIRECT in the last 8760 hours. No results found for: HGBA1C  Procedures since last visit: No results found.  Assessment/Plan  Left hip pain Told by Ortho that it is due to DJD She is S/P Right hIp replacement Takes Norco for her Pain controlled Needs Narcotic Contract next visit  Insomnia, unspecified type Has been on Ambien for 5 years Discussed about the Side effects  ? Taper dose She is going to try half pill   Depression, recurrent ([place]) Continue Celexa Anxiety On Celexa Need for DTaP vaccination  Recurrent UTI Doing well on AZO  Next Visit will need MMSE Ordered TDAP and  Shingrix   Labs/tests ordered:  * No order type specified * Next appt:  03/05/2020  Total time spent in this patient care encounter was  45_  minutes; greater than 50% of the visit spent counseling patient and staff, reviewing records , Labs and coordinating care for problems addressed at this encounter.

## 2020-03-05 ENCOUNTER — Other Ambulatory Visit: Payer: Self-pay

## 2020-03-05 DIAGNOSIS — F339 Major depressive disorder, recurrent, unspecified: Secondary | ICD-10-CM

## 2020-03-06 ENCOUNTER — Telehealth: Payer: Self-pay

## 2020-03-06 LAB — CBC WITH DIFFERENTIAL/PLATELET
Absolute Monocytes: 499 cells/uL (ref 200–950)
Basophils Absolute: 41 cells/uL (ref 0–200)
Basophils Relative: 0.7 %
Eosinophils Absolute: 157 cells/uL (ref 15–500)
Eosinophils Relative: 2.7 %
HCT: 38.4 % (ref 35.0–45.0)
Hemoglobin: 13.2 g/dL (ref 11.7–15.5)
Lymphs Abs: 2163 cells/uL (ref 850–3900)
MCH: 34.9 pg — ABNORMAL HIGH (ref 27.0–33.0)
MCHC: 34.4 g/dL (ref 32.0–36.0)
MCV: 101.6 fL — ABNORMAL HIGH (ref 80.0–100.0)
MPV: 11.1 fL (ref 7.5–12.5)
Monocytes Relative: 8.6 %
Neutro Abs: 2941 cells/uL (ref 1500–7800)
Neutrophils Relative %: 50.7 %
Platelets: 211 10*3/uL (ref 140–400)
RBC: 3.78 10*6/uL — ABNORMAL LOW (ref 3.80–5.10)
RDW: 11.7 % (ref 11.0–15.0)
Total Lymphocyte: 37.3 %
WBC: 5.8 10*3/uL (ref 3.8–10.8)

## 2020-03-06 LAB — LIPID PANEL
Cholesterol: 248 mg/dL — ABNORMAL HIGH (ref <200)
HDL: 87 mg/dL (ref >=50)
LDL Cholesterol (Calc): 139 mg/dL (calc) — ABNORMAL HIGH
Non-HDL Cholesterol (Calc): 161 mg/dL (calc) — ABNORMAL HIGH (ref <130)
Total CHOL/HDL Ratio: 2.9 (calc) (ref <5.0)
Triglycerides: 102 mg/dL (ref <150)

## 2020-03-06 LAB — COMPLETE METABOLIC PANEL WITH GFR
AG Ratio: 2.2 (calc) (ref 1.0–2.5)
ALT: 16 U/L (ref 6–29)
AST: 25 U/L (ref 10–35)
Albumin: 4.3 g/dL (ref 3.6–5.1)
Alkaline phosphatase (APISO): 65 U/L (ref 37–153)
BUN/Creatinine Ratio: 22 (calc) (ref 6–22)
BUN: 21 mg/dL (ref 7–25)
CO2: 26 mmol/L (ref 20–32)
Calcium: 9.6 mg/dL (ref 8.6–10.4)
Chloride: 104 mmol/L (ref 98–110)
Creat: 0.97 mg/dL — ABNORMAL HIGH (ref 0.60–0.88)
GFR, Est African American: 58 mL/min/{1.73_m2} — ABNORMAL LOW (ref >=60)
GFR, Est Non African American: 50 mL/min/{1.73_m2} — ABNORMAL LOW (ref >=60)
Globulin: 2 g/dL (calc) (ref 1.9–3.7)
Glucose, Bld: 89 mg/dL (ref 65–99)
Potassium: 4.6 mmol/L (ref 3.5–5.3)
Sodium: 140 mmol/L (ref 135–146)
Total Bilirubin: 0.6 mg/dL (ref 0.2–1.2)
Total Protein: 6.3 g/dL (ref 6.1–8.1)

## 2020-03-06 LAB — VITAMIN B12: Vitamin B-12: 391 pg/mL (ref 200–1100)

## 2020-03-06 LAB — VITAMIN D 25 HYDROXY (VIT D DEFICIENCY, FRACTURES): Vit D, 25-Hydroxy: 29 ng/mL — ABNORMAL LOW (ref 30–100)

## 2020-03-06 LAB — TSH: TSH: 2.88 mIU/L (ref 0.40–4.50)

## 2020-03-06 NOTE — Telephone Encounter (Signed)
LMOM for patient. Advised to start 2000iu vitamin D daily.

## 2020-03-06 NOTE — Telephone Encounter (Signed)
-----   Message from Virgie Dad, MD sent at 03/06/2020  9:16 AM EDT ----- Let her know that her Labs are looking good. Vit D is low . She needs to start taking 2000 IU OTC QD.  Cholesterol is Slightly High but can talk about it in her next Appointment. Her Hgb and Renal Function is Normal.

## 2020-04-17 ENCOUNTER — Encounter: Payer: Self-pay | Admitting: Family

## 2020-04-17 ENCOUNTER — Other Ambulatory Visit: Payer: Self-pay

## 2020-04-17 ENCOUNTER — Ambulatory Visit (INDEPENDENT_AMBULATORY_CARE_PROVIDER_SITE_OTHER): Payer: Medicare Other | Admitting: Family

## 2020-04-17 VITALS — BP 110/70 | HR 84 | Temp 97.7°F | Resp 16 | Ht 62.0 in | Wt 111.0 lb

## 2020-04-17 DIAGNOSIS — R350 Frequency of micturition: Secondary | ICD-10-CM

## 2020-04-17 LAB — POCT URINALYSIS DIPSTICK
Bilirubin, UA: NEGATIVE
Blood, UA: POSITIVE
Glucose, UA: NEGATIVE
Ketones, UA: 5
Nitrite, UA: NEGATIVE
Protein, UA: NEGATIVE
Spec Grav, UA: 1.02 (ref 1.010–1.025)
Urobilinogen, UA: 1 E.U./dL
pH, UA: 7 (ref 5.0–8.0)

## 2020-04-17 MED ORDER — CRANBERRY 475 MG PO CAPS: 475.0000 mg | ORAL_CAPSULE | Freq: Two times a day (BID) | ORAL | 0 refills | Status: AC

## 2020-04-17 NOTE — Patient Instructions (Addendum)
Urine specimen has been send to lab for culture will call you with results  - increase your water intake to 6-8 glasses per day  - Notify provider's office if running any fever > 100.5 or symptoms worsen    Urinary Tract Infection, Adult A urinary tract infection (UTI) is an infection of any part of the urinary tract. The urinary tract includes:  The kidneys.  The ureters.  The bladder.  The urethra. These organs make, store, and get rid of pee (urine) in the body. What are the causes? This is caused by germs (bacteria) in your genital area. These germs grow and cause swelling (inflammation) of your urinary tract. What increases the risk? You are more likely to develop this condition if:  You have a small, thin tube (catheter) to drain pee.  You cannot control when you pee or poop (incontinence).  You are female, and: ? You use these methods to prevent pregnancy:  A medicine that kills sperm (spermicide).  A device that blocks sperm (diaphragm). ? You have low levels of a female hormone (estrogen). ? You are pregnant.  You have genes that add to your risk.  You are sexually active.  You take antibiotic medicines.  You have trouble peeing because of: ? A prostate that is bigger than normal, if you are female. ? A blockage in the part of your body that drains pee from the bladder (urethra). ? A kidney stone. ? A nerve condition that affects your bladder (neurogenic bladder). ? Not getting enough to drink. ? Not peeing often enough.  You have other conditions, such as: ? Diabetes. ? A weak disease-fighting system (immune system). ? Sickle cell disease. ? Gout. ? Injury of the spine. What are the signs or symptoms? Symptoms of this condition include:  Needing to pee right away (urgently).  Peeing often.  Peeing small amounts often.  Pain or burning when peeing.  Blood in the pee.  Pee that smells bad or not like normal.  Trouble peeing.  Pee that is  cloudy.  Fluid coming from the vagina, if you are female.  Pain in the belly or lower back. Other symptoms include:  Throwing up (vomiting).  No urge to eat.  Feeling mixed up (confused).  Being tired and grouchy (irritable).  A fever.  Watery poop (diarrhea). How is this treated? This condition may be treated with:  Antibiotic medicine.  Other medicines.  Drinking enough water. Follow these instructions at home:  Medicines  Take over-the-counter and prescription medicines only as told by your doctor.  If you were prescribed an antibiotic medicine, take it as told by your doctor. Do not stop taking it even if you start to feel better. General instructions  Make sure you: ? Pee until your bladder is empty. ? Do not hold pee for a long time. ? Empty your bladder after sex. ? Wipe from front to back after pooping if you are a female. Use each tissue one time when you wipe.  Drink enough fluid to keep your pee pale yellow.  Keep all follow-up visits as told by your doctor. This is important. Contact a doctor if:  You do not get better after 1-2 days.  Your symptoms go away and then come back. Get help right away if:  You have very bad back pain.  You have very bad pain in your lower belly.  You have a fever.  You are sick to your stomach (nauseous).  You are throwing up. Summary  A urinary tract infection (UTI) is an infection of any part of the urinary tract.  This condition is caused by germs in your genital area.  There are many risk factors for a UTI. These include having a small, thin tube to drain pee and not being able to control when you pee or poop.  Treatment includes antibiotic medicines for germs.  Drink enough fluid to keep your pee pale yellow. This information is not intended to replace advice given to you by your health care provider. Make sure you discuss any questions you have with your health care provider. Document Revised:  11/15/2018 Document Reviewed: 06/07/2018 Elsevier Patient Education  2020 [employer].

## 2020-04-18 LAB — URINE CULTURE
MICRO NUMBER:: 10453958
SPECIMEN QUALITY:: ADEQUATE

## 2020-04-19 NOTE — Progress Notes (Signed)
Provider: Fancy Dunkley FNP-C  Laura Dad, MD  Patient Care Team: Laura Dad, MD as PCP - General (Internal Medicine) Clydell Hakim, MD as Consulting Physician (Anesthesiology) Ceasar Mons, MD as Consulting Physician (Urology)  Extended Emergency Contact Information Primary Emergency Contact: Phyllicia, Sze Mobile Phone: [phone] Relation: Daughter Secondary Emergency Contact: Karalee Height [place] Mobile Phone: [phone] Relation: Daughter  Code Status: Full Code  Goals of care: Advanced Directive information Advanced Directives 04/17/2020  Does Patient Have a Medical Advance Directive? Yes  Type of Paramedic of [place];Living will;Out of facility DNR (pink MOST or yellow form)  Does patient want to make changes to medical advance directive? No - Patient declined  Copy of [place] in Chart? No - copy requested  Would patient like information on creating a medical advance directive? -  Pre-existing out of facility DNR order (yellow form or pink MOST form) -     Chief Complaint  Patient presents with  . Acute Visit    Complains of UTI Symptoms.    HPI:  Pt is a 86 y.o. female seen today for an acute visit for evaluation of burning and frequent urination.she is here with her daughter.states symptoms have worst for the past 2 days.she denies any fever,chills,nausea,vomiting, abdominal pain or back pain.she states had loose stool this morning though recalls eat green vegetables yesterday.appetite has been good.daughter thinks has had some confusion.    Past Medical History:  Diagnosis Date  . Arthritis   . Back pain   . Leg pain   . Neck pain    History reviewed. No pertinent surgical history.  No Known Allergies  Outpatient Encounter Medications as of 04/17/2020  Medication Sig  . AZO CRANBERRY GUMMIES PO Take by mouth. 2 each day  . calcium carbonate  (OSCAL) 1500 (600 Ca) MG TABS tablet Take 1,500 mg by mouth daily at 6 (six) AM.  . Cholecalciferol (VITAMIN D) 50 MCG (2000 UT) CAPS Take by mouth daily.  . citalopram (CELEXA) 10 MG tablet Take 10 mg by mouth daily.  Marland Kitchen HYDROcodone-acetaminophen (NORCO/VICODIN) 5-325 MG tablet Take 1 tablet by mouth every 8 (eight) hours.  . Multiple Vitamin (MULTIVITAMIN) tablet Take 1 tablet by mouth daily.  . naproxen (NAPROSYN) 500 MG tablet Take 500 mg by mouth as needed.  . zolpidem (AMBIEN) 5 MG tablet Take 5 mg by mouth daily.  . Cranberry 475 MG CAPS Take 1 capsule (475 mg total) by mouth 2 (two) times daily.   No facility-administered encounter medications on file as of 04/17/2020.    Review of Systems  Constitutional: Negative for appetite change, chills, fatigue and fever.  Respiratory: Negative for cough, chest tightness, shortness of breath and wheezing.   Cardiovascular: Negative for chest pain, palpitations and leg swelling.  Gastrointestinal: Negative for abdominal distention, abdominal pain, diarrhea, nausea and vomiting.  Genitourinary: Positive for dysuria and frequency. Negative for decreased urine volume, difficulty urinating, flank pain, hematuria and urgency.  Musculoskeletal: Negative for arthralgias and gait problem.  Skin: Negative for color change, pallor and rash.  Neurological: Negative for dizziness, light-headedness and headaches.  Psychiatric/Behavioral: Negative for agitation and sleep disturbance. The patient is not nervous/anxious.     Immunization History  Administered Date(s) Administered  . Influenza, High Dose Seasonal PF 09/05/2019  . Moderna SARS-COVID-2 Vaccination 12/16/2019, 01/13/2020   Pertinent  Health Maintenance Due  Topic Date Due  . DEXA  SCAN  Never done  . PNA vac Low Risk Adult (1 of 2 - PCV13) Never done  . INFLUENZA VACCINE  07/12/2020   Fall Risk  04/17/2020  Falls in the past year? 0  Number falls in past yr: 0  Injury with Fall? 0     Vitals:   04/17/20 1507  BP: 110/70  Pulse: 84  Resp: 16  Temp: 97.7 F (36.5 C)  SpO2: 96%  Weight: 111 lb (50.3 kg)  Height: 5\' 2"  (1.575 m)   Body mass index is 20.3 kg/m. Physical Exam Vitals reviewed.  Constitutional:      General: She is not in acute distress.    Appearance: She is normal weight. She is not ill-appearing.  Eyes:     General: No scleral icterus.       Right eye: No discharge.        Left eye: No discharge.     Conjunctiva/sclera: Conjunctivae normal.     Pupils: Pupils are equal, round, and reactive to light.  Cardiovascular:     Rate and Rhythm: Normal rate and regular rhythm.     Pulses: Normal pulses.     Heart sounds: Normal heart sounds. No murmur. No friction rub. No gallop.   Pulmonary:     Effort: Pulmonary effort is normal. No respiratory distress.     Breath sounds: Normal breath sounds. No wheezing, rhonchi or rales.  Chest:     Chest wall: No tenderness.  Abdominal:     General: Bowel sounds are normal. There is no distension.     Palpations: Abdomen is soft.     Tenderness: There is abdominal tenderness in the suprapubic area. There is no right CVA tenderness, left CVA tenderness, guarding or rebound.  Neurological:     Mental Status: She is alert and oriented to person, place, and time.     Cranial Nerves: No cranial nerve deficit.     Motor: No weakness.     Gait: Gait abnormal.  Psychiatric:        Mood and Affect: Mood normal.        Behavior: Behavior normal.        Thought Content: Thought content normal.        Judgment: Judgment normal.    Labs reviewed: Recent Labs    03/04/20 0928  NA 140  K 4.6  CL 104  CO2 26  GLUCOSE 89  BUN 21  CREATININE 0.97*  CALCIUM 9.6   Recent Labs    03/04/20 0928  AST 25  ALT 16  BILITOT 0.6  PROT 6.3   Recent Labs    01/05/20 1231 03/04/20 0928  WBC 5.4 5.8  NEUTROABS 3.5 2,941  HGB 13.6 13.2  HCT 41.1 38.4  MCV 103.8* 101.6*  PLT 192 211   Lab Results   Component Value Date   TSH 2.88 03/04/2020   No results found for: HGBA1C Lab Results  Component Value Date   CHOL 248 (H) 03/04/2020   HDL 87 03/04/2020   LDLCALC 139 (H) 03/04/2020   TRIG 102 03/04/2020   CHOLHDL 2.9 03/04/2020    Significant Diagnostic Results in last 30 days:  No results found.  Assessment/Plan   Urinary frequency Afebrile.slight suprapubic tenderness with palpation. - POC Urinalysis Dipstick show yellow urine positive for blood,large 3+ Leukocytes but negative for Nitrites.Discussed results with patient and daughter.will send urine for culture and sensitivity then treat with antibiotics if indicated.Both agrees to wait for final culture and  sensitivity. - Advised to call provider's office if running any fever > 100.5 or symptoms worsen  - Also advised to increase water intake to 6-8 glasses per day - Peri-care wiping from front to back instead of back to front  - Additional education information on UTI provided on AVS.  - Culture, Urine  Family/ staff Communication: Reviewed plan of care with patient and daughter verbalized understanding.   Labs/tests ordered:   - Culture, Urine  Next Appointment: As needed if symptoms worsen or fail to improve.   Sandrea Hughs, NP

## 2020-06-10 ENCOUNTER — Encounter: Payer: Self-pay | Admitting: Internal Medicine

## 2021-12-21 DIAGNOSIS — H04123 Dry eye syndrome of bilateral lacrimal glands: Secondary | ICD-10-CM | POA: Diagnosis not present

## 2021-12-21 DIAGNOSIS — H35033 Hypertensive retinopathy, bilateral: Secondary | ICD-10-CM | POA: Diagnosis not present

## 2021-12-21 DIAGNOSIS — H353132 Nonexudative age-related macular degeneration, bilateral, intermediate dry stage: Secondary | ICD-10-CM | POA: Diagnosis not present

## 2021-12-21 DIAGNOSIS — H401131 Primary open-angle glaucoma, bilateral, mild stage: Secondary | ICD-10-CM | POA: Diagnosis not present

## 2022-01-17 DIAGNOSIS — L821 Other seborrheic keratosis: Secondary | ICD-10-CM | POA: Diagnosis not present

## 2022-01-17 DIAGNOSIS — C44612 Basal cell carcinoma of skin of right upper limb, including shoulder: Secondary | ICD-10-CM | POA: Diagnosis not present

## 2022-01-17 DIAGNOSIS — D485 Neoplasm of uncertain behavior of skin: Secondary | ICD-10-CM | POA: Diagnosis not present

## 2022-01-17 DIAGNOSIS — L820 Inflamed seborrheic keratosis: Secondary | ICD-10-CM | POA: Diagnosis not present

## 2022-01-17 DIAGNOSIS — L814 Other melanin hyperpigmentation: Secondary | ICD-10-CM | POA: Diagnosis not present

## 2022-01-17 DIAGNOSIS — Z85828 Personal history of other malignant neoplasm of skin: Secondary | ICD-10-CM | POA: Diagnosis not present

## 2022-01-17 DIAGNOSIS — L570 Actinic keratosis: Secondary | ICD-10-CM | POA: Diagnosis not present

## 2022-02-17 DIAGNOSIS — M5030 Other cervical disc degeneration, unspecified cervical region: Secondary | ICD-10-CM | POA: Diagnosis not present

## 2022-02-17 DIAGNOSIS — M48061 Spinal stenosis, lumbar region without neurogenic claudication: Secondary | ICD-10-CM | POA: Diagnosis not present

## 2022-02-17 DIAGNOSIS — R69 Illness, unspecified: Secondary | ICD-10-CM | POA: Diagnosis not present

## 2022-02-17 DIAGNOSIS — M5412 Radiculopathy, cervical region: Secondary | ICD-10-CM | POA: Diagnosis not present

## 2022-02-17 DIAGNOSIS — F1120 Opioid dependence, uncomplicated: Secondary | ICD-10-CM | POA: Diagnosis not present

## 2022-03-10 DIAGNOSIS — R69 Illness, unspecified: Secondary | ICD-10-CM | POA: Diagnosis not present

## 2022-03-10 DIAGNOSIS — G4700 Insomnia, unspecified: Secondary | ICD-10-CM | POA: Diagnosis not present

## 2022-03-10 DIAGNOSIS — Z85828 Personal history of other malignant neoplasm of skin: Secondary | ICD-10-CM | POA: Diagnosis not present

## 2022-03-10 DIAGNOSIS — Z604 Social exclusion and rejection: Secondary | ICD-10-CM | POA: Diagnosis not present

## 2022-03-10 DIAGNOSIS — Z87440 Personal history of urinary (tract) infections: Secondary | ICD-10-CM | POA: Diagnosis not present

## 2022-03-10 DIAGNOSIS — Z801 Family history of malignant neoplasm of trachea, bronchus and lung: Secondary | ICD-10-CM | POA: Diagnosis not present

## 2022-03-10 DIAGNOSIS — G8929 Other chronic pain: Secondary | ICD-10-CM | POA: Diagnosis not present

## 2022-03-10 DIAGNOSIS — R32 Unspecified urinary incontinence: Secondary | ICD-10-CM | POA: Diagnosis not present

## 2022-03-10 DIAGNOSIS — F419 Anxiety disorder, unspecified: Secondary | ICD-10-CM | POA: Diagnosis not present

## 2022-03-10 DIAGNOSIS — Z825 Family history of asthma and other chronic lower respiratory diseases: Secondary | ICD-10-CM | POA: Diagnosis not present

## 2022-03-10 DIAGNOSIS — Z008 Encounter for other general examination: Secondary | ICD-10-CM | POA: Diagnosis not present

## 2022-03-10 DIAGNOSIS — Z96649 Presence of unspecified artificial hip joint: Secondary | ICD-10-CM | POA: Diagnosis not present

## 2022-03-17 DIAGNOSIS — M5412 Radiculopathy, cervical region: Secondary | ICD-10-CM | POA: Diagnosis not present

## 2022-03-22 DIAGNOSIS — H409 Unspecified glaucoma: Secondary | ICD-10-CM | POA: Diagnosis not present

## 2022-03-22 DIAGNOSIS — R7301 Impaired fasting glucose: Secondary | ICD-10-CM | POA: Diagnosis not present

## 2022-03-22 DIAGNOSIS — M8580 Other specified disorders of bone density and structure, unspecified site: Secondary | ICD-10-CM | POA: Diagnosis not present

## 2022-03-22 DIAGNOSIS — N1831 Chronic kidney disease, stage 3a: Secondary | ICD-10-CM | POA: Diagnosis not present

## 2022-03-22 DIAGNOSIS — R69 Illness, unspecified: Secondary | ICD-10-CM | POA: Diagnosis not present

## 2022-03-22 DIAGNOSIS — H353131 Nonexudative age-related macular degeneration, bilateral, early dry stage: Secondary | ICD-10-CM | POA: Diagnosis not present

## 2022-03-22 DIAGNOSIS — K219 Gastro-esophageal reflux disease without esophagitis: Secondary | ICD-10-CM | POA: Diagnosis not present

## 2022-03-22 DIAGNOSIS — E785 Hyperlipidemia, unspecified: Secondary | ICD-10-CM | POA: Diagnosis not present

## 2022-03-22 DIAGNOSIS — R946 Abnormal results of thyroid function studies: Secondary | ICD-10-CM | POA: Diagnosis not present

## 2022-03-22 DIAGNOSIS — M533 Sacrococcygeal disorders, not elsewhere classified: Secondary | ICD-10-CM | POA: Diagnosis not present

## 2022-03-22 DIAGNOSIS — I6523 Occlusion and stenosis of bilateral carotid arteries: Secondary | ICD-10-CM | POA: Diagnosis not present

## 2022-04-14 DIAGNOSIS — R69 Illness, unspecified: Secondary | ICD-10-CM | POA: Diagnosis not present

## 2022-04-14 DIAGNOSIS — M48061 Spinal stenosis, lumbar region without neurogenic claudication: Secondary | ICD-10-CM | POA: Diagnosis not present

## 2022-04-14 DIAGNOSIS — M5030 Other cervical disc degeneration, unspecified cervical region: Secondary | ICD-10-CM | POA: Diagnosis not present

## 2022-04-14 DIAGNOSIS — M5412 Radiculopathy, cervical region: Secondary | ICD-10-CM | POA: Diagnosis not present

## 2022-04-19 DIAGNOSIS — C44329 Squamous cell carcinoma of skin of other parts of face: Secondary | ICD-10-CM | POA: Diagnosis not present

## 2022-04-19 DIAGNOSIS — Z85828 Personal history of other malignant neoplasm of skin: Secondary | ICD-10-CM | POA: Diagnosis not present

## 2022-04-19 DIAGNOSIS — L570 Actinic keratosis: Secondary | ICD-10-CM | POA: Diagnosis not present

## 2022-04-19 DIAGNOSIS — L821 Other seborrheic keratosis: Secondary | ICD-10-CM | POA: Diagnosis not present

## 2022-04-19 DIAGNOSIS — D485 Neoplasm of uncertain behavior of skin: Secondary | ICD-10-CM | POA: Diagnosis not present

## 2022-06-21 DIAGNOSIS — M5412 Radiculopathy, cervical region: Secondary | ICD-10-CM | POA: Diagnosis not present

## 2022-06-21 DIAGNOSIS — H401131 Primary open-angle glaucoma, bilateral, mild stage: Secondary | ICD-10-CM | POA: Diagnosis not present

## 2022-06-27 DIAGNOSIS — L570 Actinic keratosis: Secondary | ICD-10-CM | POA: Diagnosis not present

## 2022-06-27 DIAGNOSIS — Z85828 Personal history of other malignant neoplasm of skin: Secondary | ICD-10-CM | POA: Diagnosis not present

## 2022-06-27 DIAGNOSIS — B078 Other viral warts: Secondary | ICD-10-CM | POA: Diagnosis not present

## 2022-06-27 DIAGNOSIS — L821 Other seborrheic keratosis: Secondary | ICD-10-CM | POA: Diagnosis not present

## 2022-07-19 DIAGNOSIS — M5412 Radiculopathy, cervical region: Secondary | ICD-10-CM | POA: Diagnosis not present

## 2022-07-19 DIAGNOSIS — M48061 Spinal stenosis, lumbar region without neurogenic claudication: Secondary | ICD-10-CM | POA: Diagnosis not present

## 2022-07-19 DIAGNOSIS — R69 Illness, unspecified: Secondary | ICD-10-CM | POA: Diagnosis not present

## 2022-07-19 DIAGNOSIS — M5030 Other cervical disc degeneration, unspecified cervical region: Secondary | ICD-10-CM | POA: Diagnosis not present

## 2022-07-21 DIAGNOSIS — H401121 Primary open-angle glaucoma, left eye, mild stage: Secondary | ICD-10-CM | POA: Diagnosis not present

## 2022-08-10 DIAGNOSIS — H401131 Primary open-angle glaucoma, bilateral, mild stage: Secondary | ICD-10-CM | POA: Diagnosis not present

## 2022-09-13 DIAGNOSIS — L853 Xerosis cutis: Secondary | ICD-10-CM | POA: Diagnosis not present

## 2022-09-13 DIAGNOSIS — L821 Other seborrheic keratosis: Secondary | ICD-10-CM | POA: Diagnosis not present

## 2022-09-13 DIAGNOSIS — H401131 Primary open-angle glaucoma, bilateral, mild stage: Secondary | ICD-10-CM | POA: Diagnosis not present

## 2022-09-13 DIAGNOSIS — H61001 Unspecified perichondritis of right external ear: Secondary | ICD-10-CM | POA: Diagnosis not present

## 2022-09-13 DIAGNOSIS — L570 Actinic keratosis: Secondary | ICD-10-CM | POA: Diagnosis not present

## 2022-09-13 DIAGNOSIS — Z85828 Personal history of other malignant neoplasm of skin: Secondary | ICD-10-CM | POA: Diagnosis not present

## 2022-09-27 DIAGNOSIS — M5412 Radiculopathy, cervical region: Secondary | ICD-10-CM | POA: Diagnosis not present

## 2022-10-24 DIAGNOSIS — M5030 Other cervical disc degeneration, unspecified cervical region: Secondary | ICD-10-CM | POA: Diagnosis not present

## 2022-10-24 DIAGNOSIS — M48061 Spinal stenosis, lumbar region without neurogenic claudication: Secondary | ICD-10-CM | POA: Diagnosis not present

## 2022-10-24 DIAGNOSIS — M5412 Radiculopathy, cervical region: Secondary | ICD-10-CM | POA: Diagnosis not present

## 2022-10-24 DIAGNOSIS — R69 Illness, unspecified: Secondary | ICD-10-CM | POA: Diagnosis not present

## 2022-11-10 DIAGNOSIS — Z0000 Encounter for general adult medical examination without abnormal findings: Secondary | ICD-10-CM | POA: Diagnosis not present

## 2022-11-10 DIAGNOSIS — Z1331 Encounter for screening for depression: Secondary | ICD-10-CM | POA: Diagnosis not present

## 2022-11-10 DIAGNOSIS — M533 Sacrococcygeal disorders, not elsewhere classified: Secondary | ICD-10-CM | POA: Diagnosis not present

## 2022-11-10 DIAGNOSIS — Z1339 Encounter for screening examination for other mental health and behavioral disorders: Secondary | ICD-10-CM | POA: Diagnosis not present

## 2022-11-10 DIAGNOSIS — N1831 Chronic kidney disease, stage 3a: Secondary | ICD-10-CM | POA: Diagnosis not present

## 2022-11-10 DIAGNOSIS — R7301 Impaired fasting glucose: Secondary | ICD-10-CM | POA: Diagnosis not present

## 2022-11-10 DIAGNOSIS — H353131 Nonexudative age-related macular degeneration, bilateral, early dry stage: Secondary | ICD-10-CM | POA: Diagnosis not present

## 2022-11-10 DIAGNOSIS — F418 Other specified anxiety disorders: Secondary | ICD-10-CM | POA: Diagnosis not present

## 2022-11-10 DIAGNOSIS — F5104 Psychophysiologic insomnia: Secondary | ICD-10-CM | POA: Diagnosis not present

## 2022-11-10 DIAGNOSIS — I6523 Occlusion and stenosis of bilateral carotid arteries: Secondary | ICD-10-CM | POA: Diagnosis not present

## 2022-11-10 DIAGNOSIS — E785 Hyperlipidemia, unspecified: Secondary | ICD-10-CM | POA: Diagnosis not present

## 2022-11-10 DIAGNOSIS — R946 Abnormal results of thyroid function studies: Secondary | ICD-10-CM | POA: Diagnosis not present

## 2022-11-10 DIAGNOSIS — R69 Illness, unspecified: Secondary | ICD-10-CM | POA: Diagnosis not present

## 2023-01-03 DIAGNOSIS — M5412 Radiculopathy, cervical region: Secondary | ICD-10-CM | POA: Diagnosis not present

## 2023-02-06 DIAGNOSIS — M5030 Other cervical disc degeneration, unspecified cervical region: Secondary | ICD-10-CM | POA: Diagnosis not present

## 2023-02-06 DIAGNOSIS — M48061 Spinal stenosis, lumbar region without neurogenic claudication: Secondary | ICD-10-CM | POA: Diagnosis not present

## 2023-02-06 DIAGNOSIS — R69 Illness, unspecified: Secondary | ICD-10-CM | POA: Diagnosis not present

## 2023-02-06 DIAGNOSIS — Z681 Body mass index (BMI) 19 or less, adult: Secondary | ICD-10-CM | POA: Diagnosis not present

## 2023-02-06 DIAGNOSIS — M5412 Radiculopathy, cervical region: Secondary | ICD-10-CM | POA: Diagnosis not present

## 2023-02-07 DIAGNOSIS — H401132 Primary open-angle glaucoma, bilateral, moderate stage: Secondary | ICD-10-CM | POA: Diagnosis not present

## 2023-02-07 DIAGNOSIS — H5203 Hypermetropia, bilateral: Secondary | ICD-10-CM | POA: Diagnosis not present

## 2023-02-07 DIAGNOSIS — Z961 Presence of intraocular lens: Secondary | ICD-10-CM | POA: Diagnosis not present

## 2023-02-23 DIAGNOSIS — R6889 Other general symptoms and signs: Secondary | ICD-10-CM | POA: Diagnosis not present

## 2023-02-23 DIAGNOSIS — W19XXXA Unspecified fall, initial encounter: Secondary | ICD-10-CM | POA: Diagnosis not present

## 2023-02-23 DIAGNOSIS — Z743 Need for continuous supervision: Secondary | ICD-10-CM | POA: Diagnosis not present

## 2023-02-24 ENCOUNTER — Emergency Department (HOSPITAL_COMMUNITY): Payer: Medicare HMO

## 2023-02-24 ENCOUNTER — Inpatient Hospital Stay (HOSPITAL_COMMUNITY)
Admission: EM | Admit: 2023-02-24 | Discharge: 2023-03-03 | DRG: 559 | Disposition: A | Discharge status: Skilled Nursing Facility | Payer: Medicare HMO | Source: Skilled Nursing Facility | Attending: Family Medicine | Admitting: Family Medicine

## 2023-02-24 ENCOUNTER — Encounter (HOSPITAL_COMMUNITY): Payer: Self-pay | Admitting: Internal Medicine

## 2023-02-24 ENCOUNTER — Other Ambulatory Visit: Payer: Self-pay

## 2023-02-24 DIAGNOSIS — M9701XA Periprosthetic fracture around internal prosthetic right hip joint, initial encounter: Secondary | ICD-10-CM | POA: Diagnosis not present

## 2023-02-24 DIAGNOSIS — M1990 Unspecified osteoarthritis, unspecified site: Secondary | ICD-10-CM | POA: Diagnosis not present

## 2023-02-24 DIAGNOSIS — S72001A Fracture of unspecified part of neck of right femur, initial encounter for closed fracture: Secondary | ICD-10-CM | POA: Diagnosis present

## 2023-02-24 DIAGNOSIS — J439 Emphysema, unspecified: Secondary | ICD-10-CM | POA: Diagnosis not present

## 2023-02-24 DIAGNOSIS — Z96649 Presence of unspecified artificial hip joint: Secondary | ICD-10-CM

## 2023-02-24 DIAGNOSIS — Z823 Family history of stroke: Secondary | ICD-10-CM

## 2023-02-24 DIAGNOSIS — S72351A Displaced comminuted fracture of shaft of right femur, initial encounter for closed fracture: Secondary | ICD-10-CM | POA: Diagnosis not present

## 2023-02-24 DIAGNOSIS — J189 Pneumonia, unspecified organism: Secondary | ICD-10-CM

## 2023-02-24 DIAGNOSIS — Z66 Do not resuscitate: Secondary | ICD-10-CM | POA: Diagnosis not present

## 2023-02-24 DIAGNOSIS — I739 Peripheral vascular disease, unspecified: Secondary | ICD-10-CM | POA: Diagnosis present

## 2023-02-24 DIAGNOSIS — W1830XA Fall on same level, unspecified, initial encounter: Secondary | ICD-10-CM | POA: Diagnosis not present

## 2023-02-24 DIAGNOSIS — E785 Hyperlipidemia, unspecified: Secondary | ICD-10-CM | POA: Diagnosis present

## 2023-02-24 DIAGNOSIS — M978XXS Periprosthetic fracture around other internal prosthetic joint, sequela: Secondary | ICD-10-CM

## 2023-02-24 DIAGNOSIS — Y92092 Bedroom in other non-institutional residence as the place of occurrence of the external cause: Secondary | ICD-10-CM | POA: Diagnosis not present

## 2023-02-24 DIAGNOSIS — Z043 Encounter for examination and observation following other accident: Secondary | ICD-10-CM | POA: Diagnosis not present

## 2023-02-24 DIAGNOSIS — W19XXXA Unspecified fall, initial encounter: Secondary | ICD-10-CM | POA: Diagnosis not present

## 2023-02-24 DIAGNOSIS — M978XXA Periprosthetic fracture around other internal prosthetic joint, initial encounter: Secondary | ICD-10-CM

## 2023-02-24 DIAGNOSIS — G4700 Insomnia, unspecified: Secondary | ICD-10-CM

## 2023-02-24 DIAGNOSIS — Z809 Family history of malignant neoplasm, unspecified: Secondary | ICD-10-CM | POA: Diagnosis not present

## 2023-02-24 DIAGNOSIS — Z79899 Other long term (current) drug therapy: Secondary | ICD-10-CM

## 2023-02-24 DIAGNOSIS — Z1152 Encounter for screening for COVID-19: Secondary | ICD-10-CM

## 2023-02-24 DIAGNOSIS — Z96641 Presence of right artificial hip joint: Secondary | ICD-10-CM | POA: Diagnosis not present

## 2023-02-24 DIAGNOSIS — N1831 Chronic kidney disease, stage 3a: Secondary | ICD-10-CM | POA: Diagnosis present

## 2023-02-24 DIAGNOSIS — R600 Localized edema: Secondary | ICD-10-CM | POA: Diagnosis not present

## 2023-02-24 DIAGNOSIS — R000 Tachycardia, unspecified: Secondary | ICD-10-CM | POA: Diagnosis not present

## 2023-02-24 HISTORY — DX: Chronic kidney disease, stage 3a: N18.31

## 2023-02-24 HISTORY — DX: Insomnia, unspecified: G47.00

## 2023-02-24 LAB — BASIC METABOLIC PANEL
Anion gap: 11 (ref 5–15)
BUN: 16 mg/dL (ref 8–23)
CO2: 24 mmol/L (ref 22–32)
Calcium: 8.7 mg/dL — ABNORMAL LOW (ref 8.9–10.3)
Chloride: 101 mmol/L (ref 98–111)
Creatinine, Ser: 0.8 mg/dL (ref 0.44–1.00)
GFR, Estimated: >60 mL/min (ref >60)
Glucose, Bld: 160 mg/dL — ABNORMAL HIGH (ref 70–99)
Potassium: 4.1 mmol/L (ref 3.5–5.1)
Sodium: 136 mmol/L (ref 135–145)

## 2023-02-24 LAB — CBC WITH DIFFERENTIAL/PLATELET
Abs Immature Granulocytes: 0.06 10*3/uL (ref 0.00–0.07)
Basophils Absolute: 0 10*3/uL (ref 0.0–0.1)
Basophils Relative: 0 %
Eosinophils Absolute: 0 10*3/uL (ref 0.0–0.5)
Eosinophils Relative: 0 %
HCT: 32.1 % — ABNORMAL LOW (ref 36.0–46.0)
Hemoglobin: 10.4 g/dL — ABNORMAL LOW (ref 12.0–15.0)
Immature Granulocytes: 0 %
Lymphocytes Relative: 8 %
Lymphs Abs: 1 10*3/uL (ref 0.7–4.0)
MCH: 31.8 pg (ref 26.0–34.0)
MCHC: 32.4 g/dL (ref 30.0–36.0)
MCV: 98.2 fL (ref 80.0–100.0)
Monocytes Absolute: 0.6 10*3/uL (ref 0.1–1.0)
Monocytes Relative: 5 %
Neutro Abs: 11.9 10*3/uL — ABNORMAL HIGH (ref 1.7–7.7)
Neutrophils Relative %: 87 %
Platelets: 318 10*3/uL (ref 150–400)
RBC: 3.27 MIL/uL — ABNORMAL LOW (ref 3.87–5.11)
RDW: 12.6 % (ref 11.5–15.5)
WBC: 13.6 10*3/uL — ABNORMAL HIGH (ref 4.0–10.5)
nRBC: 0 % (ref 0.0–0.2)

## 2023-02-24 LAB — TYPE AND SCREEN
ABO/RH(D): B POS
Antibody Screen: NEGATIVE

## 2023-02-24 LAB — PROTIME-INR
INR: 1 (ref 0.8–1.2)
Prothrombin Time: 13.4 seconds (ref 11.4–15.2)

## 2023-02-24 MED ORDER — ONDANSETRON HCL 4 MG PO TABS: 4.0000 mg | ORAL_TABLET | Freq: Four times a day (QID) | ORAL | Status: DC | PRN

## 2023-02-24 MED ORDER — ZOLPIDEM TARTRATE 5 MG PO TABS
5.0000 mg | ORAL_TABLET | Freq: Every evening | ORAL | Status: DC | PRN
  Administered 2023-02-24 – 2023-03-02 (×8): 5 mg via ORAL
  Filled 2023-02-24 (×8): qty 1

## 2023-02-24 MED ORDER — LACTATED RINGERS IV SOLN: INTRAVENOUS | Status: AC

## 2023-02-24 MED ORDER — SODIUM CHLORIDE 0.9 % IV SOLN
1.0000 g | Freq: Once | INTRAVENOUS | Status: AC
  Administered 2023-02-24: 1 g via INTRAVENOUS
  Filled 2023-02-24: qty 10

## 2023-02-24 MED ORDER — AZITHROMYCIN 250 MG PO TABS
500.0000 mg | ORAL_TABLET | Freq: Every day | ORAL | Status: DC
  Administered 2023-02-25 – 2023-02-28 (×4): 500 mg via ORAL
  Filled 2023-02-24 (×4): qty 2

## 2023-02-24 MED ORDER — HYDROCODONE-ACETAMINOPHEN 5-325 MG PO TABS
1.0000 | ORAL_TABLET | Freq: Once | ORAL | Status: AC
  Administered 2023-02-24: 1 via ORAL

## 2023-02-24 MED ORDER — LATANOPROST 0.005 % OP SOLN
1.0000 [drp] | Freq: Every day | OPHTHALMIC | Status: DC
  Administered 2023-02-24 – 2023-03-02 (×7): 1 [drp] via OPHTHALMIC
  Filled 2023-02-24: qty 2.5

## 2023-02-24 MED ORDER — FENTANYL CITRATE PF 50 MCG/ML IJ SOSY
25.0000 ug | PREFILLED_SYRINGE | INTRAMUSCULAR | Status: AC | PRN
  Administered 2023-02-24 (×2): 25 ug via INTRAVENOUS
  Filled 2023-02-24 (×2): qty 1

## 2023-02-24 MED ORDER — ACETAMINOPHEN 650 MG RE SUPP: 650.0000 mg | Freq: Four times a day (QID) | RECTAL | Status: DC | PRN

## 2023-02-24 MED ORDER — ONDANSETRON HCL 4 MG/2ML IJ SOLN
4.0000 mg | Freq: Once | INTRAMUSCULAR | Status: AC
  Administered 2023-02-24: 4 mg via INTRAVENOUS
  Filled 2023-02-24: qty 2

## 2023-02-24 MED ORDER — CEFDINIR 300 MG PO CAPS
300.0000 mg | ORAL_CAPSULE | Freq: Two times a day (BID) | ORAL | Status: DC
  Administered 2023-02-24 – 2023-02-28 (×9): 300 mg via ORAL
  Filled 2023-02-24 (×9): qty 1

## 2023-02-24 MED ORDER — SODIUM CHLORIDE 0.9 % IV SOLN
500.0000 mg | Freq: Once | INTRAVENOUS | Status: AC
  Administered 2023-02-24: 500 mg via INTRAVENOUS
  Filled 2023-02-24: qty 5

## 2023-02-24 MED ORDER — ACETAMINOPHEN 325 MG PO TABS: 650.0000 mg | ORAL_TABLET | Freq: Four times a day (QID) | ORAL | Status: DC | PRN

## 2023-02-24 MED ORDER — HYDROCODONE-ACETAMINOPHEN 5-325 MG PO TABS
1.0000 | ORAL_TABLET | ORAL | Status: DC | PRN
  Administered 2023-02-24 – 2023-03-03 (×23): 1 via ORAL
  Filled 2023-02-24 (×25): qty 1

## 2023-02-24 MED ORDER — ONDANSETRON HCL 4 MG/2ML IJ SOLN: 4.0000 mg | Freq: Four times a day (QID) | INTRAMUSCULAR | Status: DC | PRN

## 2023-02-24 MED ORDER — ZOLPIDEM TARTRATE 5 MG PO TABS: 5.0000 mg | ORAL_TABLET | Freq: Once | ORAL | Status: DC

## 2023-02-24 NOTE — Progress Notes (Signed)
Progress Note   Patient: Laura Hancock [id number] DOB: [birth year] DOA: 02/24/2023     0 DOS: the patient was seen and examined on 02/24/2023   Brief hospital course: 89 year old female with a history of degenerative disc disease, hyperkalemia, carotid stenosis, insomnia, reflux disease, CKD stage III yea was evaluated in the hospital after sustaining a fall.  X-ray found a periprosthetic femur fracture around a prior right total hip replacement.  Orthopedic surgeon evaluated at this time advised nonoperative management with 50% weightbearing pending physical therapy eval. Assessment and Plan: * 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, initial encounter Admit to med/surg bed. EDP has contacted Dr. Delfino Lovett with orthopedics. Keep NPO. Norco 5/325 for pain prn. Non-weight bearing right leg. -Orthopedic surgeon has evaluated the patient at this time advising nonoperative management with 50% weightbearing of the right lower extremity with a walker with no active abduction for 6 weeks.  They have advised outpatient follow-up with Dr. Lyla Glassing or Dr. Alvan Dame - Physical therapy has been consulted. Currently states that as long as she is not moving she is not in pain.  We like to see if the patient will tolerate physical therapy.  Based on her age and overall debility I feel patient cannot go back to the independent living facility and will most likely need to go to a skilled nursing facility.  Insomnia Pt verifies that she takes ambien 5 mg qhs. Verified with PDMP.  LLL pneumonia Pt without any SOB, cough or fever. Check procalcitonin. Pt given IV rocephin and zithromax in ER. Will order Omnicef and zithromax -Respiratory status is stable patient denies having any shortness of breath. - However leukocytosis count has trended trended up. - Will check procalcitonin.  DNR (do not resuscitate)/DNI(Do Not Intubate) Verified DNR/DNI with patient and DNR form. Added it to her ACP  documents.         Subjective: Patient seen and examined at bedside today.  Patient states as long as she is not vomiting she does not have any pain.  Denies any cough shortness of breath fever.  Physical Exam: Vitals:   02/24/23 0630 02/24/23 0800 02/24/23 0934 02/24/23 1000  BP: (!) 94/53 (!) 103/49 (!) 114/57   Pulse: 75 63 67   Resp: 19  16   Temp:   97.6 F (36.4 C)   TempSrc:   Oral   SpO2: 96% 96% 100%   Height:    5\' 2"  (1.575 m)   Constitutional:      General: She is not in acute distress.    Appearance: Normal appearance. She is not toxic-appearing or diaphoretic.     Comments: Thin, frail appearing female  HENT:     Head: Normocephalic and atraumatic.     Nose: Nose normal.  Eyes:     General: No scleral icterus. Cardiovascular:     Rate and Rhythm: Normal rate and regular rhythm.     Pulses: Normal pulses.  Pulmonary:     Effort: Pulmonary effort is normal.     Breath sounds: Normal breath sounds.  Abdominal:     General: Abdomen is flat. Bowel sounds are normal. There is no distension.  Musculoskeletal:     Right lower leg: No edema.     Left lower leg: No edema.  Even slight movement of right leg causes pain Skin:    General: Skin is warm and dry.     Capillary Refill: Capillary refill takes less than 2 seconds.  Neurological:     Mental  Status: She is alert and oriented to person, place, and time. Data Reviewed:  There are no new results to review at this time.  Family Communication: Spoke to patient's daughter at bedside  Disposition: Status is: Inpatient Remains inpatient appropriate because: Patient needs further evaluation for weightbearing with physical therapy and treatment of pneumonia  Planned Discharge Destination: Skilled nursing facility    Time spent: 35 minutes  Author: Carlyle Lipa, MD 02/24/2023 1:04 PM  For on call review [email or web address].

## 2023-02-24 NOTE — H&P (Signed)
History and Physical    Laura Hancock [id number] DOB: 08-Aug-1924 DOA: 02/24/2023  DOS: the patient was seen and examined on 02/24/2023  PCP: Virgie Dad, MD   Patient coming from: Home, [facility]  I have personally briefly reviewed patient's old medical records in [place]  CC: fall HPI: 89 year old female with a history of degenerative disc disease, hyperlipidemia, carotid stenosis, insomnia, reflux, CKD stage IIIa, presents to the ER today after a fall at independent living.  She currently lives at [facility], a senior living community.  She has a independent living apartment.  She states that she got a bed to go to the bathroom.  She lost her balance.  She fell on her right side.  She had immediate pain.  Patient brought to the ER.  X-rays found a periprosthetic femur fracture around a prior right total hip replacement.  EDP is contacted orthopedics.  They requested a CT hip.  Patient also found to have incidental pneumonia on her chest x-ray.  Patient denies any cough, fever.  [facility] hospitalist contacted for admission.   ED Course: xray shows right femur periprosthetic fracture. Cxr shows lll pneumonia  Review of Systems:  Review of Systems  Constitutional: Negative.   HENT: Negative.    Eyes: Negative.   Respiratory: Negative.    Cardiovascular: Negative.   Gastrointestinal: Negative.   Genitourinary: Negative.   Musculoskeletal:  Positive for joint pain.  Skin: Negative.   Neurological: Negative.   Endo/Heme/Allergies: Negative.   Psychiatric/Behavioral:  The patient has insomnia.        Chronic insomnia.  All other systems reviewed and are negative.   Past Medical History:  Diagnosis Date   Arthritis    Back pain    Chronic kidney disease, stage 3a (HCC)    Insomnia    Leg pain    Neck pain     No past surgical history on file.   reports that she has never smoked. She has never used smokeless tobacco. She reports current alcohol  use of about 14.0 standard drinks of alcohol per week. She reports that she does not use drugs.  No Known Allergies  Family History  Problem Relation Age of Onset   Cancer Father    Cancer Sister    Stroke Sister     Prior to Admission medications   Medication Sig Start Date End Date Taking? Authorizing Provider  AZO CRANBERRY GUMMIES PO Take by mouth. 2 each day    [provider]  calcium carbonate (OSCAL) 1500 (600 Ca) MG TABS tablet Take 1,500 mg by mouth daily at 6 (six) AM.    [provider]  Cholecalciferol (VITAMIN D) 50 MCG (2000 UT) CAPS Take by mouth daily.    [provider]  citalopram (CELEXA) 10 MG tablet Take 10 mg by mouth daily.    [provider]  Multiple Vitamin (MULTIVITAMIN) tablet Take 1 tablet by mouth daily.    [provider]  naproxen (NAPROSYN) 500 MG tablet Take 500 mg by mouth as needed.    [provider]  zolpidem (AMBIEN) 5 MG tablet Take 5 mg by mouth daily. 12/24/19   [provider]    Physical Exam: Vitals:   02/24/23 0030 02/24/23 0045 02/24/23 0115 02/24/23 0200  BP: (!) 119/58 128/61  109/65  Pulse: 91 90 86 92  Resp: (!) 23 (!) 22 (!) 32 (!) 21  Temp:      TempSrc:  SpO2: 97% 98% 95% 97%    Physical Exam Vitals and nursing note reviewed.  Constitutional:      General: She is not in acute distress.    Appearance: Normal appearance. She is not toxic-appearing or diaphoretic.     Comments: Thin, frail appearing female  HENT:     Head: Normocephalic and atraumatic.     Nose: Nose normal.  Eyes:     General: No scleral icterus. Cardiovascular:     Rate and Rhythm: Normal rate and regular rhythm.     Pulses: Normal pulses.  Pulmonary:     Effort: Pulmonary effort is normal.     Breath sounds: Normal breath sounds.  Abdominal:     General: Abdomen is flat. Bowel sounds are normal. There is no distension.  Musculoskeletal:     Right lower leg: No edema.     Left  lower leg: No edema.  Skin:    General: Skin is warm and dry.     Capillary Refill: Capillary refill takes less than 2 seconds.  Neurological:     Mental Status: She is alert and oriented to person, place, and time.      Labs on Admission: I have personally reviewed following labs and imaging studies  CBC: Recent Labs  Lab 02/24/23 0120  WBC 13.6*  NEUTROABS 11.9*  HGB 10.4*  HCT 32.1*  MCV 98.2  PLT 0000000   Basic Metabolic Panel: Recent Labs  Lab 02/24/23 0120  NA 136  K 4.1  CL 101  CO2 24  GLUCOSE 160*  BUN 16  CREATININE 0.80  CALCIUM 8.7*   GFR: CrCl cannot be calculated (Unknown ideal weight.). Liver Function Tests: No results for input(s): "AST", "ALT", "ALKPHOS", "BILITOT", "PROT", "ALBUMIN" in the last 168 hours. No results for input(s): "LIPASE", "AMYLASE" in the last 168 hours. No results for input(s): "AMMONIA" in the last 168 hours. Coagulation Profile: Recent Labs  Lab 02/24/23 0120  INR 1.0   Cardiac Enzymes: No results for input(s): "CKTOTAL", "CKMB", "CKMBINDEX", "TROPONINI", "TROPONINIHS" in the last 168 hours. BNP (last 3 results) No results for input(s): "BNP" in the last 8760 hours. HbA1C: No results for input(s): "HGBA1C" in the last 72 hours. CBG: No results for input(s): "GLUCAP" in the last 168 hours. Lipid Profile: No results for input(s): "CHOL", "HDL", "LDLCALC", "TRIG", "CHOLHDL", "LDLDIRECT" in the last 72 hours. Thyroid Function Tests: No results for input(s): "TSH", "T4TOTAL", "FREET4", "T3FREE", "THYROIDAB" in the last 72 hours. Anemia Panel: No results for input(s): "VITAMINB12", "FOLATE", "FERRITIN", "TIBC", "IRON", "RETICCTPCT" in the last 72 hours. Urine analysis:    Component Value Date/Time   COLORURINE YELLOW 09/24/2008 0940   APPEARANCEUR CLEAR 09/24/2008 0940   LABSPEC 1.022 09/24/2008 0940   PHURINE 7.5 09/24/2008 0940   GLUCOSEU NEGATIVE 09/24/2008 0940   HGBUR NEGATIVE 09/24/2008 0940   BILIRUBINUR  Negative 04/17/2020 1530   KETONESUR NEGATIVE 09/24/2008 0940   PROTEINUR Negative 04/17/2020 1530   PROTEINUR NEGATIVE 09/24/2008 0940   UROBILINOGEN 1.0 04/17/2020 1530   UROBILINOGEN 0.2 09/24/2008 0940   NITRITE Negative 04/17/2020 1530   NITRITE NEGATIVE 09/24/2008 0940   LEUKOCYTESUR Large (3+) (A) 04/17/2020 1530    Radiological Exams on Admission: I have personally reviewed images DG Chest 1 View  Result Date: 02/24/2023 CLINICAL DATA:  190176 Fall 190176 EXAM: CHEST  1 VIEW COMPARISON:  Chest x-ray 09/24/2008 FINDINGS: The heart and mediastinal contours are unchanged. Aortic calcification. Biapical pleural/pulmonary scarring. Left lung patchy airspace and interstitial opacities more prominent  along the left lower lung zone. No pulmonary edema. No pleural effusion. No pneumothorax. No acute osseous abnormality. IMPRESSION: 1. Left lung patchy airspace and interstitial opacities more prominent along the left lower lung zone suggestive of infection/inflammation. Followup PA and lateral chest X-ray is recommended in 3-4 weeks following therapy to ensure resolution and exclude underlying malignancy. 2.  Emphysema (ICD10-J43.9). Electronically Signed   By: Iven Finn M.D.   On: 02/24/2023 01:28   DG Knee Complete 4 Views Right  Result Date: 02/24/2023 CLINICAL DATA:  190176 Fall 190176 EXAM: RIGHT KNEE - COMPLETE 4+ VIEW COMPARISON:  None Available. FINDINGS: No evidence of fracture, dislocation, or joint effusion. Mild degenerative changes of the knee. Soft tissues are unremarkable. Vascular calcification. IMPRESSION: No acute displaced fracture or dislocation. Electronically Signed   By: Iven Finn M.D.   On: 02/24/2023 01:26   DG Hip Unilat With Pelvis 2-3 Views Right  Result Date: 02/24/2023 CLINICAL DATA:  pain EXAM: DG HIP (WITH OR WITHOUT PELVIS) 2-3V RIGHT COMPARISON:  None Available. FINDINGS: Right hip total arthroplasty. Acute periprosthetic comminuted fracture of the  proximal femoral cortex. Normal alignment of the right hip surgical hardware. There is no evidence of hip fracture or dislocation of the left hip on frontal view. At least moderate degenerative changes of the left hip. No focal bone abnormality. No acute displaced fracture or diastasis of the bones of the pelvis. Degenerative changes of the visualized lower lumbar spine. IMPRESSION: Acute periprosthetic comminuted fracture of the proximal femoral cortex in a patient status post total right hip arthroplasty. Electronically Signed   By: Iven Finn M.D.   On: 02/24/2023 01:26    EKG: My personal interpretation of EKG shows: sinus tachycardia    Assessment/Plan Principal Problem:   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, initial encounter Active Problems:   DNR (do not resuscitate)/DNI(Do Not Intubate)   LLL pneumonia   Insomnia    Assessment and Plan: * 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, initial encounter Admit to med/surg bed. EDP has contacted Dr. Delfino Lovett with orthopedics. Keep NPO. Norco 5/325 for pain prn. Non-weight bearing right leg.  Insomnia Pt verifies that she takes ambien 5 mg qhs. Verified with PDMP.  LLL pneumonia Pt without any SOB, cough or fever. Check procalcitonin. Pt given IV rocephin and zithromax in ER. Will order Omnicef and zithromax  DNR (do not resuscitate)/DNI(Do Not Intubate) Verified DNR/DNI with patient and DNR form. Added it to her ACP documents.     DVT prophylaxis: SCDs Code Status: DNR/DNI(Do NOT Intubate). Verified with pt and reviewed DNR form Family Communication: no family at bedside  Disposition Plan: return to home vs SNF(pt lives at [facility]) [place] called: EDP has consulted ortho [place]  Admission status: Inpatient, Med-Surg   Kristopher Oppenheim, DO [facility] 02/24/2023, 2:19 AM

## 2023-02-24 NOTE — Consult Note (Signed)
ORTHOPAEDIC CONSULTATION  REQUESTING PHYSICIAN: Kristopher Oppenheim, DO  PCP:  Virgie Dad, MD  Chief Complaint: Right hip injury after fall.   HPI: Laura Hancock is a 89 y.o. female with a history of degenerative disc disease, hyperkalemia, carotid stenosis, insomnia, reflux disease, CKD stage III yea was evaluated in the hospital after sustaining a fall. She currently lives at [facility], a senior living community.  She has a independent living apartment that she lives in alone. She states that she got a bed to go to the bathroom. She lost her balance.  She fell on her right side.  She had immediate pain.  Patient brought to the ER. X-ray found a periprosthetic femur fracture around a prior right total hip replacement. Orthopedics was consulted for evaluation.    She denies any tingling or numbness in LE bilaterally. She states she did not hit her head. She states she usually does not ambulate with assistive devices in the house but uses a walker when she goes out.    Past Medical History:  Diagnosis Date   Arthritis    Back pain    Chronic kidney disease, stage 3a (HCC)    Insomnia    Leg pain    Neck pain    History reviewed. No pertinent surgical history. Social History   Socioeconomic History   Marital status: Widowed    Spouse name: Not on file   Number of children: Not on file   Years of education: Not on file   Highest education level: Not on file  Occupational History   Not on file  Tobacco Use   Smoking status: Never   Smokeless tobacco: Never  Vaping Use   Vaping Use: Never used  Substance and Sexual Activity   Alcohol use: Yes    Alcohol/week: 14.0 standard drinks of alcohol    Types: 14 Standard drinks or equivalent per week   Drug use: Never   Sexual activity: Not on file  Other Topics Concern   Not on file  Social History Narrative   Social History      Diet? light      Do you drink/eat things with caffeine? yes      Marital status?                Widowed                      What year were you married? 1946      Do you live in a house, apartment, assisted living, condo, trailer, etc.? Apartment retirement community       Is it one or more stories? yes      How many persons live in your home? 1      Do you have any pets in your home? (please list) no       Highest level of education completed? 2 years college       Current or past profession:      Do you exercise?         yes                             Type & how often? Water aerobics       Advanced Directives      Do you have a living will? yes      Do you have a DNR form?  yes                If not, do you want to discuss one? yes      Do you have signed POA/HPOA for forms? yes      Functional Status      Do you have difficulty bathing or dressing yourself? No       Do you have difficulty preparing food or eating? No       Do you have difficulty managing your medications? No       Do you have difficulty managing your finances? No       Do you have difficulty affording your medications? No       Social Determinants of Health   Financial Resource Strain: Not on file  Food Insecurity: No Food Insecurity (02/24/2023)   Hunger Vital Sign    Worried About Running Out of Food in the Last Year: Never true    Ran Out of Food in the Last Year: Never true  Transportation Needs: No Transportation Needs (02/24/2023)   PRAPARE - Hydrologist (Medical): No    Lack of Transportation (Non-Medical): No  Physical Activity: Not on file  Stress: Not on file  Social Connections: Not on file   Family History  Problem Relation Age of Onset   Cancer Father    Cancer Sister    Stroke Sister    No Known Allergies Prior to Admission medications   Medication Sig Start Date End Date Taking? Authorizing Provider  AZO CRANBERRY GUMMIES PO Take by mouth. 2 each day   Yes [provider]  calcium carbonate (OSCAL) 1500 (600  Ca) MG TABS tablet Take 1,500 mg by mouth daily at 6 (six) AM.   Yes [provider]  citalopram (CELEXA) 10 MG tablet Take 10 mg by mouth daily.   Yes [provider]  diazepam (VALIUM) 5 MG tablet Take 5 mg by mouth once as needed for anxiety. 02/06/23  Yes [provider]  latanoprost (XALATAN) 0.005 % ophthalmic solution Place 1 drop into both eyes at bedtime. 02/12/23  Yes [provider]  Multiple Vitamin (MULTIVITAMIN) tablet Take 1 tablet by mouth daily.   Yes [provider]  naproxen (NAPROSYN) 500 MG tablet Take 500 mg by mouth as needed.   Yes [provider]  traZODone (DESYREL) 50 MG tablet Take 25 mg by mouth at bedtime as needed.   Yes [provider]  zolpidem (AMBIEN) 5 MG tablet Take 5 mg by mouth daily. 12/24/19  Yes [provider]   CT Hip Right Wo Contrast  Result Date: 02/24/2023 CLINICAL DATA:  Hip trauma, fracture suspected, xray done EXAM: CT OF THE RIGHT HIP WITHOUT CONTRAST TECHNIQUE: Multidetector CT imaging of the right hip was performed according to the standard protocol. Multiplanar CT image reconstructions were also generated. RADIATION DOSE REDUCTION: This exam was performed according to the departmental dose-optimization program which includes automated exposure control, adjustment of the mA and/or kV according to patient size and/or use of iterative reconstruction technique. COMPARISON:  X-ray right hip 02/24/2023 FINDINGS: Bones/Joint/Cartilage Right hip total arthroplasty. Acute periprosthetic comminuted fracture of the proximal femoral shaft and intertrochanteric region. Vague cortical irregularity of the right inferior and superior pubic rami with no definite fractures. Normal alignment of the right hip surgical hardware. No aggressive appearing focal bone abnormality. Ligaments Suboptimally assessed by CT. Muscles and Tendons Grossly unremarkable. Soft tissues Mild subcutaneus soft tissue edema.  No large hematoma formation. Other: Colonic diverticulosis.  Atherosclerotic plaque. IMPRESSION: Acute periprosthetic comminuted fracture of the proximal femoral shaft and intertrochanteric region. Electronically Signed   By: Iven Finn M.D.   On: 02/24/2023 03:12   DG Chest 1 View  Result Date: 02/24/2023 CLINICAL DATA:  190176 Fall 190176 EXAM: CHEST  1 VIEW COMPARISON:  Chest x-ray 09/24/2008 FINDINGS: The heart and mediastinal contours are unchanged. Aortic calcification. Biapical pleural/pulmonary scarring. Left lung patchy airspace and interstitial opacities more prominent along the left lower lung zone. No pulmonary edema. No pleural effusion. No pneumothorax. No acute osseous abnormality. IMPRESSION: 1. Left lung patchy airspace and interstitial opacities more prominent along the left lower lung zone suggestive of infection/inflammation. Followup PA and lateral chest X-ray is recommended in 3-4 weeks following therapy to ensure resolution and exclude underlying malignancy. 2.  Emphysema (ICD10-J43.9). Electronically Signed   By: Iven Finn M.D.   On: 02/24/2023 01:28   DG Knee Complete 4 Views Right  Result Date: 02/24/2023 CLINICAL DATA:  190176 Fall 190176 EXAM: RIGHT KNEE - COMPLETE 4+ VIEW COMPARISON:  None Available. FINDINGS: No evidence of fracture, dislocation, or joint effusion. Mild degenerative changes of the knee. Soft tissues are unremarkable. Vascular calcification. IMPRESSION: No acute displaced fracture or dislocation. Electronically Signed   By: Iven Finn M.D.   On: 02/24/2023 01:26   DG Hip Unilat With Pelvis 2-3 Views Right  Result Date: 02/24/2023 CLINICAL DATA:  pain EXAM: DG HIP (WITH OR WITHOUT PELVIS) 2-3V RIGHT COMPARISON:  None Available. FINDINGS: Right hip total arthroplasty. Acute periprosthetic comminuted fracture of the proximal femoral cortex. Normal alignment of the right hip surgical hardware. There is no evidence of hip fracture or dislocation of  the left hip on frontal view. At least moderate degenerative changes of the left hip. No focal bone abnormality. No acute displaced fracture or diastasis of the bones of the pelvis. Degenerative changes of the visualized lower lumbar spine. IMPRESSION: Acute periprosthetic comminuted fracture of the proximal femoral cortex in a patient status post total right hip arthroplasty. Electronically Signed   By: Iven Finn M.D.   On: 02/24/2023 01:26    Positive ROS: All other systems have been reviewed and were otherwise negative with the exception of those mentioned in the HPI and as above.  Physical Exam: General: Alert, no acute distress Cardiovascular: No pedal edema Respiratory: No cyanosis, no use of accessory musculature GI: No organomegaly, abdomen is soft and non-tender Skin: No lesions in the area of chief complaint Neurologic: Sensation intact distally Psychiatric: Patient is competent for consent with normal mood and affect Lymphatic: No axillary or cervical lymphadenopathy  MUSCULOSKELETAL: Examination of the right hip reveals no skin wounds or lesions. Healed surgical scar to right hip. Small contusion from fall on lateral hip. Mild pain with hip movement and palpation.   Sensory and motor function intact in LE bilaterally including dorsiflexion, plantar flexion, and EHL. Distal pedal pulses 2+ bilaterally. Calves soft and non-tender. No significant pedal edema.   Assessment: Right [place] Ag proximal femur fracture   Plan: I discussed the findings with the patient. Patient has right [place] Ag ppx femur fx, amenable to nonoperative treatment. Recommend 50% WB RLE with walker, no active abduction for 6 weeks. Follow up in 2 weeks with Dr. Lyla Glassing or Dr. Alvan Dame.     Charlott Rakes, PA-C    02/24/2023 4:44 PM

## 2023-02-24 NOTE — Progress Notes (Signed)
Consult received for R periprosthetic hip fx after a ground level fall. X-rays and CT reviewed. Patient has right [place] Ag ppx femur fx, amenable to nonoperative treatment. Recommend 50% WB RLE with walker, no active abduction for 6 weeks. Follow up in 2 weeks with me or Dr. Alvan Dame. Full consult to follow.

## 2023-02-24 NOTE — Subjective & Objective (Signed)
CC: fall HPI: 89 year old female with a history of degenerative disc disease, hyperlipidemia, carotid stenosis, insomnia, reflux, CKD stage IIIa, presents to the ER today after a fall at independent living.  She currently lives at [facility], a senior living community.  She has a independent living apartment.  She states that she got a bed to go to the bathroom.  She lost her balance.  She fell on her right side.  She had immediate pain.  Patient brought to the ER.  X-rays found a periprosthetic femur fracture around a prior right total hip replacement.  EDP is contacted orthopedics.  They requested a CT hip.  Patient also found to have incidental pneumonia on her chest x-ray.  Patient denies any cough, fever.  [facility] hospitalist contacted for admission.

## 2023-02-24 NOTE — Plan of Care (Signed)
  Problem: Education: Goal: Knowledge of General Education information will improve Description: Including pain rating scale, medication(s)/side effects and non-pharmacologic comfort measures Outcome: Progressing   Problem: Pain Managment: Goal: General experience of comfort will improve Outcome: Progressing   Problem: Safety: Goal: Ability to remain free from injury will improve Outcome: Progressing   

## 2023-02-24 NOTE — Assessment & Plan Note (Addendum)
Pt without any SOB, cough or fever. Check procalcitonin. Pt given IV rocephin and zithromax in ER. Will order Omnicef and zithromax

## 2023-02-24 NOTE — ED Triage Notes (Signed)
Patient BIB EMS from nursing facility- independent living side, had unwitnessed fall. Patient states she fell when she was walking to the bathroom. Complaining of right hip and leg pain.  EMS vs: 100/62, 94, 94%, CBG 227

## 2023-02-24 NOTE — Assessment & Plan Note (Signed)
Admit to med/surg bed. EDP has contacted Dr. Delfino Lovett with orthopedics. Keep NPO. Norco 5/325 for pain prn. Non-weight bearing right leg.

## 2023-02-24 NOTE — ED Provider Notes (Signed)
[place] EMERGENCY DEPARTMENT AT [facility] Provider Note   CSN: [id number] Arrival date & time: 02/24/23  0014     History  No chief complaint on file.   Laura Hancock is a 89 y.o. female.  The history is provided by the patient.  Fall This is a new problem. Episode onset: prior to arrival. The problem occurs constantly. Pertinent negatives include no chest pain, no abdominal pain and no headaches. The symptoms are aggravated by walking. The symptoms are relieved by rest.   Patient presents for follow-up.  Patient lives in a local nursing facility.  She was walking to the bathroom when she just collapsed and fell on her right hip.  No head injury or LOC.  No neck or back pain.  She reports all the pain is in her right leg She had otherwise been at her baseline   Past Medical History:  Diagnosis Date   Arthritis    Back pain    Leg pain    Neck pain     Home Medications Prior to Admission medications   Medication Sig Start Date End Date Taking? Authorizing Provider  AZO CRANBERRY GUMMIES PO Take by mouth. 2 each day    [provider]  calcium carbonate (OSCAL) 1500 (600 Ca) MG TABS tablet Take 1,500 mg by mouth daily at 6 (six) AM.    [provider]  Cholecalciferol (VITAMIN D) 50 MCG (2000 UT) CAPS Take by mouth daily.    [provider]  citalopram (CELEXA) 10 MG tablet Take 10 mg by mouth daily.    [provider]  Multiple Vitamin (MULTIVITAMIN) tablet Take 1 tablet by mouth daily.    [provider]  naproxen (NAPROSYN) 500 MG tablet Take 500 mg by mouth as needed.    [provider]  zolpidem (AMBIEN) 5 MG tablet Take 5 mg by mouth daily. 12/24/19   [provider]      Allergies    Patient has no known allergies.    Review of Systems   Review of Systems  Cardiovascular:  Negative for chest pain.  Gastrointestinal:  Negative for abdominal pain.  Musculoskeletal:  Positive for  arthralgias. Negative for back pain and neck pain.  Neurological:  Negative for headaches.    Physical Exam Updated Vital Signs BP 128/61   Pulse 86   Temp 99 F (37.2 C) (Oral)   Resp (!) 32   SpO2 95%  Physical Exam CONSTITUTIONAL: Elderly, no acute distress HEAD: Normocephalic/atraumatic, no visible trauma EYES: EOMI/PERRL ENMT: Mucous membranes moist NECK: supple no meningeal signs SPINE/BACK:entire spine nontender No bruising/crepitance/stepoffs noted to spine CV: S1/S2 noted, no murmurs/rubs/gallops noted LUNGS: Coarse breath sounds bilaterally, no acute distress ABDOMEN: soft, nontender GU:no cva tenderness NEURO: Pt is awake/alert/appropriate, moves all extremitiesx4.  No facial droop.   EXTREMITIES: pulses normal/equal, full ROM Right lower extremity is shortened.  Tenderness range of motion of right hip.  Tenderness to palpation of right knee.  Distal pulses equal and intact SKIN: warm, color normal PSYCH: no abnormalities of mood noted, alert and oriented to situation  ED Results / Procedures / Treatments   Labs (all labs ordered are listed, but only abnormal results are displayed) Labs Reviewed  CBC WITH DIFFERENTIAL/PLATELET - Abnormal; Notable for the following components:      Result Value   WBC 13.6 (*)    RBC 3.27 (*)    Hemoglobin 10.4 (*)    HCT 32.1 (*)  Neutro Abs 11.9 (*)    All other components within normal limits  BASIC METABOLIC PANEL  PROTIME-INR  TYPE AND SCREEN    EKG EKG Interpretation  Date/Time:  Friday February 24 2023 00:48:24 EDT Ventricular Rate:  89 PR Interval:  198 QRS Duration: 109 QT Interval:  423 QTC Calculation: 515 R Axis:   -54 Text Interpretation: Sinus tachycardia Multiform ventricular premature complexes Left anterior fascicular block Low voltage, precordial leads Abnormal R-wave progression, early transition Consider anterior infarct Prolonged QT interval No previous ECGs available Interpretation limited  secondary to artifact Confirmed by Ripley Fraise [id number]) on 02/24/2023 1:05:02 AM  Radiology DG Chest 1 View  Result Date: 02/24/2023 CLINICAL DATA:  190176 Fall 190176 EXAM: CHEST  1 VIEW COMPARISON:  Chest x-ray 09/24/2008 FINDINGS: The heart and mediastinal contours are unchanged. Aortic calcification. Biapical pleural/pulmonary scarring. Left lung patchy airspace and interstitial opacities more prominent along the left lower lung zone. No pulmonary edema. No pleural effusion. No pneumothorax. No acute osseous abnormality. IMPRESSION: 1. Left lung patchy airspace and interstitial opacities more prominent along the left lower lung zone suggestive of infection/inflammation. Followup PA and lateral chest X-ray is recommended in 3-4 weeks following therapy to ensure resolution and exclude underlying malignancy. 2.  Emphysema (ICD10-J43.9). Electronically Signed   By: Iven Finn M.D.   On: 02/24/2023 01:28   DG Knee Complete 4 Views Right  Result Date: 02/24/2023 CLINICAL DATA:  190176 Fall 190176 EXAM: RIGHT KNEE - COMPLETE 4+ VIEW COMPARISON:  None Available. FINDINGS: No evidence of fracture, dislocation, or joint effusion. Mild degenerative changes of the knee. Soft tissues are unremarkable. Vascular calcification. IMPRESSION: No acute displaced fracture or dislocation. Electronically Signed   By: Iven Finn M.D.   On: 02/24/2023 01:26   DG Hip Unilat With Pelvis 2-3 Views Right  Result Date: 02/24/2023 CLINICAL DATA:  pain EXAM: DG HIP (WITH OR WITHOUT PELVIS) 2-3V RIGHT COMPARISON:  None Available. FINDINGS: Right hip total arthroplasty. Acute periprosthetic comminuted fracture of the proximal femoral cortex. Normal alignment of the right hip surgical hardware. There is no evidence of hip fracture or dislocation of the left hip on frontal view. At least moderate degenerative changes of the left hip. No focal bone abnormality. No acute displaced fracture or diastasis of the bones of the  pelvis. Degenerative changes of the visualized lower lumbar spine. IMPRESSION: Acute periprosthetic comminuted fracture of the proximal femoral cortex in a patient status post total right hip arthroplasty. Electronically Signed   By: Iven Finn M.D.   On: 02/24/2023 01:26    Procedures Procedures    Medications Ordered in ED Medications  fentaNYL (SUBLIMAZE) injection 25 mcg (25 mcg Intravenous Given 02/24/23 0129)  cefTRIAXone (ROCEPHIN) 1 g in sodium chloride 0.9 % 100 mL IVPB (has no administration in time range)  azithromycin (ZITHROMAX) 500 mg in sodium chloride 0.9 % 250 mL IVPB (has no administration in time range)  ondansetron (ZOFRAN) injection 4 mg (4 mg Intravenous Given 02/24/23 0129)    ED Course/ Medical Decision Making/ A&P Clinical Course as of 02/24/23 0154  Fri Feb 24, 2023  0146 Patient found to have right periprosthetic fracture.  Discussed the case with Dr. Lyla Glassing.  He recommends hospitalist admission.  Recommend CT hip.  He will see in the morning.  Keep n.p.o. for now. [DW]  [id number] Patient also noted to have pneumonia by chest x-ray which is consistent with her exam.  Will start antibiotics. [DW]  0148 Attempted to call daughter per  patient request, but no answer [DW]  0154 Discussed with Dr. Bridgett Larsson for admission [DW]    Clinical Course User Index [DW] Ripley Fraise, MD                             Medical Decision Making Amount and/or Complexity of Data Reviewed Labs: ordered. Radiology: ordered.  Risk Prescription drug management. Decision regarding hospitalization.   This patient presents to the ED for concern of fall and hip pain, this involves an extensive number of treatment options, and is a complaint that carries with it a high risk of complications and morbidity.  The differential diagnosis includes but is not limited to fracture, hip dislocation, lumbar strain, lumbar spinal fracture, muscle strain  Comorbidities that complicate the patient  evaluation: Patient's presentation is complicated by their history of arthritis  Social Determinants of Health: Patient's  poor mobility   increases the complexity of managing their presentation  Additional history obtained: Records reviewed  outpatient records reviewed  Lab Tests: I Ordered, and personally interpreted labs.  The pertinent results include: Leukocytosis  Imaging Studies ordered: I ordered imaging studies including X-ray right hip, right knee, chest x-ray   I independently visualized and interpreted imaging which showed pneumonia, right periprosthetic fracture I agree with the radiologist interpretation  Cardiac Monitoring: The patient was maintained on a cardiac monitor.  I personally viewed and interpreted the cardiac monitor which showed an underlying rhythm of:  sinus rhythm  Medicines ordered and prescription drug management: I ordered medication including fentanyl for pain Reevaluation of the patient after these medicines showed that the patient    improved   Critical Interventions:   admission for hip fracture management  Consultations Obtained: I requested consultation with the admitting physician Dr. Bridgett Larsson and consultant dr swinteck , and discussed  findings as well as pertinent plan - they recommend: admit  Reevaluation: After the interventions noted above, I reevaluated the patient and found that they have :improved  Complexity of problems addressed: Patient's presentation is most consistent with  acute presentation with potential threat to life or bodily function  Disposition: After consideration of the diagnostic results and the patient's response to treatment,  I feel that the patent would benefit from admission   .           Final Clinical Impression(s) / ED Diagnoses Final diagnoses:  Closed fracture of right hip, initial encounter [facility])  Community acquired pneumonia of left lower lobe of lung    Rx / DC Orders ED Discharge Orders      None         Ripley Fraise, MD 02/24/23 0155

## 2023-02-24 NOTE — Assessment & Plan Note (Addendum)
Pt verifies that she takes ambien 5 mg qhs. Verified with PDMP.

## 2023-02-24 NOTE — Evaluation (Signed)
Physical Therapy Evaluation Patient Details Name: Laura Hancock MRN: [id number] DOB: [birth year] Today's Date: 02/24/2023  History of Present Illness  Pt is a 89 y/o F admitted on 02/24/23 after presenting with a fall at her ILF. X-ray showed periprosthetic femur fx around prior R THA. Orthopedics was consulted & pt is 50% weight bearing with no active abduction on RLE x 6 weeks. Pt also found to have incidental PNA on chest x-ray. PMH: DDD, HLD, carotid stenosis, insomnia, CKD 3A  Clinical Impression  Pt seen for PT evaluation with pt's daughter [name]) present for session. Pt resides at [place] apartment where she furniture walks in her home but uses rollator outside of the home. Pt is still driving & denies any other falls besides this one. PT educates pt on PWB 50% RLE & no active hip abduction but pt with decreased recall & will require ongoing cuing/education. Pt requires min assist to complete bed mobility & min<>mod assist for sit<>stand. Pt does progress to ambulating ~6 ft in room with RW & min assist. Recommend ongoing PT services to increase independence with functional mobility.       Recommendations for follow up therapy are one component of a multi-disciplinary discharge planning process, led by the attending physician.  Recommendations may be updated based on patient status, additional functional criteria and insurance authorization.  Follow Up Recommendations Skilled nursing-short term rehab (<3 hours/day) Can patient physically be transported by private vehicle: Yes    Assistance Recommended at Discharge Intermittent Supervision/Assistance  Patient can return home with the following  A little help with walking and/or transfers;A little help with bathing/dressing/bathroom;Assistance with cooking/housework;Assist for transportation;Help with stairs or ramp for entrance    Equipment Recommendations None recommended by PT (TBD in next venue)  Recommendations for  Other Services  OT consult    Functional Status Assessment Patient has had a recent decline in their functional status and demonstrates the ability to make significant improvements in function in a reasonable and predictable amount of time.     Precautions / Restrictions Precautions Precautions: Fall Restrictions Weight Bearing Restrictions: Yes RLE Weight Bearing: Partial weight bearing RLE Partial Weight Bearing Percentage or Pounds: 50% Other Position/Activity Restrictions: no active hip ABduction      Mobility  Bed Mobility Overal bed mobility: Needs Assistance Bed Mobility: Supine to Sit     Supine to sit: HOB elevated, Min assist     General bed mobility comments: cuing to use bed rails but poor return demo, requires slightly extra time    Transfers Overall transfer level: Needs assistance Equipment used: Rolling walker (2 wheels) Transfers: Sit to/from Stand, Bed to chair/wheelchair/BSC Sit to Stand: Mod assist, Min assist   Step pivot transfers: Min assist (with RW, cuing for technique/sequencing)       General transfer comment: Pt is able to transfer sit<>stand from EOB & recliner with min assist when pushing to standing with 1UE & other UE on RW. Pt requires mod<>max assist for sit<>stand from recliner when BUE pushing from armrests. Pt requires cuing to place feet underneath her & for increased BOS vs narrow. PT provides education re: need to ensure BLE feet are touching chair before reaching back to sit safely.    Ambulation/Gait Ambulation/Gait assistance: Min assist Gait Distance (Feet): 6 Feet Assistive device: Rolling walker (2 wheels) Gait Pattern/deviations: Step-to pattern, Decreased step length - left, Decreased step length - right, Decreased stride length, Decreased dorsiflexion - right, Decreased dorsiflexion - left, Decreased weight shift  to right Gait velocity: decreased     General Gait Details: PT provides demonstration & education re: use of  RW to decrease weight bearing through RLE. Pt requires 1-2 standing rest breaks to "catch my breath".  Stairs            Wheelchair Mobility    Modified Rankin (Stroke Patients Only)       Balance Overall balance assessment: Needs assistance Sitting-balance support: Feet supported, Bilateral upper extremity supported Sitting balance-Leahy Scale: Fair     Standing balance support: Bilateral upper extremity supported, During functional activity, Reliant on assistive device for balance Standing balance-Leahy Scale: Fair                               Pertinent Vitals/Pain Pain Assessment Pain Assessment: Faces Faces Pain Scale: Hurts little more Pain Location: RLE, pt with most pain during sit<>stand transfers Pain Descriptors / Indicators: Discomfort, Grimacing, Guarding Pain Intervention(s): Monitored during session    Home Living Family/patient expects to be discharged to:: Private residence (ILF) Living Arrangements: Alone   Type of Home: Apartment Home Access: Level entry         Home Equipment: Rollator (4 wheels) Additional Comments: Pt from [place] [facility], lives alone    Prior Function Prior Level of Function : Driving;Independent/Modified Independent             Mobility Comments: Pt was independent without AD, furniture walked in her apartment, used rollator outside of the apartment, still driving, denies other falls.       Hand Dominance        Extremity/Trunk Assessment   Upper Extremity Assessment Upper Extremity Assessment: Overall WFL for tasks assessed    Lower Extremity Assessment Lower Extremity Assessment: RLE deficits/detail RLE Deficits / Details: 2+/5 knee extension in sitting, limited by pain       Communication   Communication: HOH  Cognition Arousal/Alertness: Awake/alert Behavior During Therapy: WFL for tasks assessed/performed Overall Cognitive Status: Within Functional Limits for  tasks assessed                                          General Comments      Exercises General Exercises - Lower Extremity Long Arc Quad: AROM, Seated, Strengthening, Right, 10 reps   Assessment/Plan    PT Assessment Patient needs continued PT services  PT Problem List Decreased strength;Pain;Decreased range of motion;Decreased activity tolerance;Decreased balance;Decreased mobility;Decreased knowledge of precautions;Decreased safety awareness;Decreased knowledge of use of DME       PT Treatment Interventions DME instruction;Therapeutic exercise;Gait training;Balance training;Stair training;Neuromuscular re-education;Functional mobility training;Therapeutic activities;Patient/family education;Modalities    PT Goals (Current goals can be found in the Care Plan section)  Acute Rehab PT Goals Patient Stated Goal: decreased pain, return to independent PLOF PT Goal Formulation: With patient Time For Goal Achievement: 03/10/23 Potential to Achieve Goals: Fair    Frequency Min 3X/week     Co-evaluation               AM-PAC PT "6 Clicks" Mobility  Outcome Measure Help needed turning from your back to your side while in a flat bed without using bedrails?: A Little Help needed moving from lying on your back to sitting on the side of a flat bed without using bedrails?: A Little Help needed moving to and  from a bed to a chair (including a wheelchair)?: A Little Help needed standing up from a chair using your arms (e.g., wheelchair or bedside chair)?: A Lot Help needed to walk in hospital room?: A Little Help needed climbing 3-5 steps with a railing? : Total 6 Click Score: 15    End of Session   Activity Tolerance: Patient tolerated treatment well Patient left: in chair;with call bell/phone within reach;with chair alarm set Nurse Communication: Mobility status PT Visit Diagnosis: Pain;Other abnormalities of gait and mobility (R26.89);Difficulty in walking,  not elsewhere classified (R26.2);Muscle weakness (generalized) (M62.81);Unsteadiness on feet (R26.81) Pain - Right/Left: Right Pain - part of body: Hip    Time: 1240-1312 PT Time Calculation (min) (ACUTE ONLY): 32 min   Charges:   PT Evaluation $PT Eval Moderate Complexity: 1 Mod PT Treatments $Gait Training: 8-22 mins        Lavone Nian, PT, DPT 02/24/23, 1:36 PM   Waunita Schooner 02/24/2023, 1:34 PM

## 2023-02-24 NOTE — Assessment & Plan Note (Signed)
Verified DNR/DNI with patient and DNR form. Added it to her ACP documents.

## 2023-02-25 DIAGNOSIS — Z96649 Presence of unspecified artificial hip joint: Secondary | ICD-10-CM | POA: Diagnosis not present

## 2023-02-25 DIAGNOSIS — M978XXA Periprosthetic fracture around other internal prosthetic joint, initial encounter: Secondary | ICD-10-CM | POA: Diagnosis not present

## 2023-02-25 LAB — RESPIRATORY PANEL BY PCR

## 2023-02-25 LAB — RESP PANEL BY RT-PCR (RSV, FLU A&B, COVID)  RVPGX2
Influenza A by PCR: NEGATIVE
Influenza B by PCR: NEGATIVE
Resp Syncytial Virus by PCR: NEGATIVE
SARS Coronavirus 2 by RT PCR: NEGATIVE

## 2023-02-25 LAB — HIV ANTIBODY (ROUTINE TESTING W REFLEX): HIV Screen 4th Generation wRfx: NONREACTIVE

## 2023-02-25 MED ORDER — MELATONIN 3 MG PO TABS
3.0000 mg | ORAL_TABLET | Freq: Once | ORAL | Status: AC
  Administered 2023-02-25: 3 mg via ORAL
  Filled 2023-02-25: qty 1

## 2023-02-25 MED ORDER — CITALOPRAM HYDROBROMIDE 20 MG PO TABS
10.0000 mg | ORAL_TABLET | Freq: Every day | ORAL | Status: DC
  Administered 2023-02-25 – 2023-03-03 (×7): 10 mg via ORAL
  Filled 2023-02-25 (×7): qty 1

## 2023-02-25 MED ORDER — LATANOPROST 0.005 % OP SOLN: 1.0000 [drp] | Freq: Every day | OPHTHALMIC | Status: DC

## 2023-02-25 MED ORDER — SENNA 8.6 MG PO TABS
1.0000 | ORAL_TABLET | Freq: Every day | ORAL | Status: DC
  Administered 2023-02-25 – 2023-03-03 (×7): 8.6 mg via ORAL
  Filled 2023-02-25 (×7): qty 1

## 2023-02-25 NOTE — [place] FL2 (Signed)
[place] MEDICAID FL2 LEVEL OF CARE FORM     IDENTIFICATION  Patient Name: Laura Hancock Birthdate: [birth year] Sex: female Admission Date (Current Location): 02/24/2023  [facility] and [place] Number:  Herbalist and Address:  [facility],  [place] [address], [place]      Provider Number: [id number]  Attending Physician Name and Address:  Debbe Odea, MD  Relative Name and Phone Number:   Riyanshi Oldfield ([phone])    Current Level of Care: Hospital Recommended Level of Care: [place] Prior Approval Number:    Date Approved/Denied:   PASRR Number:  ([id number])  Discharge Plan: SNF    Current Diagnoses: Patient Active Problem List   Diagnosis Date Noted   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, initial encounter 02/24/2023   DNR (do not resuscitate)/DNI(Do Not Intubate) 02/24/2023   LLL pneumonia 02/24/2023   Insomnia 02/24/2023    Orientation RESPIRATION BLADDER Height & Weight     Self, Time, Situation, Place  Normal Continent Weight: 45.8 kg Height:  5\' 2"  (157.5 cm)  BEHAVIORAL SYMPTOMS/MOOD NEUROLOGICAL BOWEL NUTRITION STATUS      Continent Diet (Regular)  AMBULATORY STATUS COMMUNICATION OF NEEDS Skin   Limited Assist Verbally Normal                       Personal Care Assistance Level of Assistance  Bathing, Feeding, Dressing Bathing Assistance: Limited assistance Feeding assistance: Limited assistance Dressing Assistance: Limited assistance     Functional Limitations Info  Sight, Hearing, Speech Sight Info: Adequate Hearing Info: Adequate Speech Info: Adequate    SPECIAL CARE FACTORS FREQUENCY  PT (By licensed PT), OT (By licensed OT)     PT Frequency:  (5x week) OT Frequency:  (5x week)            Contractures Contractures Info: Not present    Additional Factors Info  Code Status, Allergies Code Status Info:  (DNR) Allergies Info:  (NKA)            Current Medications (02/25/2023):  This is the current hospital active medication list Current Facility-Administered Medications  Medication Dose Route Frequency Provider Last Rate Last Admin   acetaminophen (TYLENOL) tablet 650 mg  650 mg Oral Q6H PRN Kristopher Oppenheim, DO       Or   acetaminophen (TYLENOL) suppository 650 mg  650 mg Rectal Q6H PRN Kristopher Oppenheim, DO       azithromycin [facility]) tablet 500 mg  500 mg Oral Daily Kristopher Oppenheim, DO   500 mg at 02/25/23 [id number]   cefdinir (OMNICEF) capsule 300 mg  300 mg Oral Q12H Kristopher Oppenheim, DO   300 mg at 02/25/23 [id number]   citalopram (CELEXA) tablet 10 mg  10 mg Oral Daily Debbe Odea, MD   10 mg at 02/25/23 1046   HYDROcodone-acetaminophen (NORCO/VICODIN) 5-325 MG per tablet 1 tablet  1 tablet Oral Q4H PRN Kristopher Oppenheim, DO   1 tablet at 02/25/23 1212   latanoprost (XALATAN) 0.005 % ophthalmic solution 1 drop  1 drop Both Eyes QHS Carlyle Lipa, MD   1 drop at 02/24/23 2142   ondansetron (ZOFRAN) tablet 4 mg  4 mg Oral Q6H PRN Kristopher Oppenheim, DO       Or   ondansetron [facility]) injection 4 mg  4 mg Intravenous Q6H PRN Kristopher Oppenheim, DO       zolpidem (AMBIEN) tablet 5 mg  5 mg Oral QHS PRN  Kristopher Oppenheim, DO   5 mg at 02/24/23 2142   zolpidem (AMBIEN) tablet 5 mg  5 mg Oral Once Kristopher Oppenheim, DO         Discharge Medications: Please see discharge summary for a list of discharge medications.  Relevant Imaging Results:  Relevant Lab Results:   Additional Information  (ss#[phone])  Adrienna Karis, Juliann Pulse, RN

## 2023-02-25 NOTE — Plan of Care (Signed)

## 2023-02-25 NOTE — Progress Notes (Signed)
[facility] Progress Note  Patient: Laura Hancock     [id number]  DOA: 02/24/2023   PCP: Virgie Dad, MD       Brief hospital course: 89 y/o female presents after a fall an is found to have a right periprosthetic fracture and an incidental infiltrate in her LLL. Started on Ceftriaxone and Azithromycin and ortho consulted.   Subjective:  Has pain only when she moves her right hip. No cough or dyspnea. No fever.   Assessment and Plan: Principal Problem:   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, initial encounter - conservative management - only ambulated a short distance yesterday and will have PT again today - [facility] but [facility] SNF part- FL2 done today - cont PRN Hydrocodone which she states helped a great deal yesterday  Active Problems:      LLL infiltrate - will treat with 5 days of antibiotics for pneumonia - cont Cefdinir and Azithromycin    Insomnia - Ambien     Code Status: DNR Consultants: ortho Level of Care: Level of care: Med-Surg Total time on patient care: 35 min DVT prophylaxis:  SCDs Start: 02/24/23 0416     Objective:   Vitals:   02/24/23 2029 02/25/23 0236 02/25/23 0607 02/25/23 1430  BP: (!) 116/51 (!) 111/54 (!) 113/49 (!) 94/53  Pulse: 74 (!) 59 60 70  Resp: 18 18 16 18   Temp: 98.7 F (37.1 C) 97.8 F (36.6 C) 97.8 F (36.6 C) 97.9 F (36.6 C)  TempSrc: Oral Oral Oral Oral  SpO2: 96% 99% 95% 99%  Weight:      Height:       Filed Weights   02/24/23 1700  Weight: 45.8 kg   Exam: General exam: Appears comfortable  HEENT: oral mucosa moist Respiratory system: Clear to auscultation.  Cardiovascular system: S1 & S2 heard  Gastrointestinal system: Abdomen soft, non-tender, nondistended. Normal bowel sounds   Extremities: No cyanosis, clubbing or edema Psychiatry:  Mood & affect appropriate.      CBC: Recent Labs  Lab 02/24/23 0120  WBC 13.6*  NEUTROABS 11.9*  HGB 10.4*   HCT 32.1*  MCV 98.2  PLT 0000000   Basic Metabolic Panel: Recent Labs  Lab 02/24/23 0120  NA 136  K 4.1  CL 101  CO2 24  GLUCOSE 160*  BUN 16  CREATININE 0.80  CALCIUM 8.7*   GFR: Estimated Creatinine Clearance: 29.7 mL/min (by C-G formula based on SCr of 0.8 mg/dL).  Scheduled Meds:  azithromycin  500 mg Oral Daily   cefdinir  300 mg Oral Q12H   citalopram  10 mg Oral Daily   latanoprost  1 drop Both Eyes QHS   zolpidem  5 mg Oral Once   Continuous Infusions: Imaging and lab data was personally reviewed CT Hip Right Wo Contrast  Result Date: 02/24/2023 CLINICAL DATA:  Hip trauma, fracture suspected, xray done EXAM: CT OF THE RIGHT HIP WITHOUT CONTRAST TECHNIQUE: Multidetector CT imaging of the right hip was performed according to the standard protocol. Multiplanar CT image reconstructions were also generated. RADIATION DOSE REDUCTION: This exam was performed according to the departmental dose-optimization program which includes automated exposure control, adjustment of the mA and/or kV according to patient size and/or use of iterative reconstruction technique. COMPARISON:  X-ray right hip 02/24/2023 FINDINGS: Bones/Joint/Cartilage Right hip total arthroplasty. Acute periprosthetic comminuted fracture of the proximal femoral shaft and intertrochanteric region. Vague cortical irregularity of the right inferior and superior pubic rami  with no definite fractures. Normal alignment of the right hip surgical hardware. No aggressive appearing focal bone abnormality. Ligaments Suboptimally assessed by CT. Muscles and Tendons Grossly unremarkable. Soft tissues Mild subcutaneus soft tissue edema.  No large hematoma formation. Other: Colonic diverticulosis.  Atherosclerotic plaque. IMPRESSION: Acute periprosthetic comminuted fracture of the proximal femoral shaft and intertrochanteric region. Electronically Signed   By: Iven Finn M.D.   On: 02/24/2023 03:12   DG Chest 1 View  Result Date:  02/24/2023 CLINICAL DATA:  190176 Fall 190176 EXAM: CHEST  1 VIEW COMPARISON:  Chest x-ray 09/24/2008 FINDINGS: The heart and mediastinal contours are unchanged. Aortic calcification. Biapical pleural/pulmonary scarring. Left lung patchy airspace and interstitial opacities more prominent along the left lower lung zone. No pulmonary edema. No pleural effusion. No pneumothorax. No acute osseous abnormality. IMPRESSION: 1. Left lung patchy airspace and interstitial opacities more prominent along the left lower lung zone suggestive of infection/inflammation. Followup PA and lateral chest X-ray is recommended in 3-4 weeks following therapy to ensure resolution and exclude underlying malignancy. 2.  Emphysema (ICD10-J43.9). Electronically Signed   By: Iven Finn M.D.   On: 02/24/2023 01:28   DG Knee Complete 4 Views Right  Result Date: 02/24/2023 CLINICAL DATA:  190176 Fall 190176 EXAM: RIGHT KNEE - COMPLETE 4+ VIEW COMPARISON:  None Available. FINDINGS: No evidence of fracture, dislocation, or joint effusion. Mild degenerative changes of the knee. Soft tissues are unremarkable. Vascular calcification. IMPRESSION: No acute displaced fracture or dislocation. Electronically Signed   By: Iven Finn M.D.   On: 02/24/2023 01:26   DG Hip Unilat With Pelvis 2-3 Views Right  Result Date: 02/24/2023 CLINICAL DATA:  pain EXAM: DG HIP (WITH OR WITHOUT PELVIS) 2-3V RIGHT COMPARISON:  None Available. FINDINGS: Right hip total arthroplasty. Acute periprosthetic comminuted fracture of the proximal femoral cortex. Normal alignment of the right hip surgical hardware. There is no evidence of hip fracture or dislocation of the left hip on frontal view. At least moderate degenerative changes of the left hip. No focal bone abnormality. No acute displaced fracture or diastasis of the bones of the pelvis. Degenerative changes of the visualized lower lumbar spine. IMPRESSION: Acute periprosthetic comminuted fracture of the  proximal femoral cortex in a patient status post total right hip arthroplasty. Electronically Signed   By: Iven Finn M.D.   On: 02/24/2023 01:26    LOS: 1 day   Author: Debbe Odea  02/25/2023 4:35 PM  To contact [facility]>   Check the care team in [facility] and look for the attending/consulting [facility] provider listed  Log into [email or web address] and use [facility] universal password   Go to> "[facility]"  and find provider  If you still have difficulty reaching the provider, please page the [facility] (Director on Call) for the Hospitalists listed on amion

## 2023-02-25 NOTE — Progress Notes (Signed)
Physical Therapy Treatment Patient Details Name: Laura Hancock MRN: [id number] DOB: August 21, 1924 Today's Date: 02/25/2023   History of Present Illness Pt is a 89 y/o F admitted on 02/24/23 after presenting with a fall at her ILF. X-ray showed periprosthetic femur fx around prior R THA. Orthopedics was consulted & pt is 50% weight bearing with no active abduction on RLE x 6 weeks. Pt also found to have incidental PNA on chest x-ray. PMH: DDD, HLD, carotid stenosis, insomnia, CKD 3A    PT Comments    Pt very cooperative and up to ambulate short distance in room but requiring step-by-step cues for adherence to [facility] and distance ltd by increasing pain.  Pt assisted to chair, ice pack provided and pain meds requested - RN reports pt declined pain meds prior to PT.   Recommendations for follow up therapy are one component of a multi-disciplinary discharge planning process, led by the attending physician.  Recommendations may be updated based on patient status, additional functional criteria and insurance authorization.  Follow Up Recommendations  Skilled nursing-short term rehab (<3 hours/day) Can patient physically be transported by private vehicle: Yes   Assistance Recommended at Discharge Intermittent Supervision/Assistance  Patient can return home with the following A little help with walking and/or transfers;A little help with bathing/dressing/bathroom;Assistance with cooking/housework;Assist for transportation;Help with stairs or ramp for entrance   Equipment Recommendations  None recommended by PT    Recommendations for Other Services OT consult     Precautions / Restrictions Precautions Precautions: Fall Restrictions Weight Bearing Restrictions: Yes RLE Weight Bearing: Partial weight bearing RLE Partial Weight Bearing Percentage or Pounds: 50 Other Position/Activity Restrictions: no active hip ABduction     Mobility  Bed Mobility   Bed Mobility: Supine to Sit     Supine to  sit: HOB elevated, Min assist     General bed mobility comments: cues for sequence with assist to manage R LE, to control trunk and to complete rotation to EOB sitting with use of bedpad.    Transfers Overall transfer level: Needs assistance Equipment used: Rolling walker (2 wheels) Transfers: Sit to/from Stand Sit to Stand: Min assist, Mod assist           General transfer comment: cues for LE management and use of UEs to self assist.  Physical assist to bring wt up and fwd and to balance in intial standing with RW    Ambulation/Gait Ambulation/Gait assistance: Min assist Gait Distance (Feet): 6 Feet Assistive device: Rolling walker (2 wheels) Gait Pattern/deviations: Step-to pattern, Decreased step length - left, Decreased step length - right, Decreased stride length, Decreased dorsiflexion - right, Decreased dorsiflexion - left, Decreased weight shift to right Gait velocity: decreased     General Gait Details: step-by-step cues for sequence, posture, and position from RW.  Cues for UE WB to follow PWB   Stairs             Wheelchair Mobility    Modified Rankin (Stroke Patients Only)       Balance Overall balance assessment: Needs assistance Sitting-balance support: Feet supported, Bilateral upper extremity supported Sitting balance-Leahy Scale: Fair     Standing balance support: Single extremity supported Standing balance-Leahy Scale: Fair                              Cognition Arousal/Alertness: Awake/alert Behavior During Therapy: WFL for tasks assessed/performed Overall Cognitive Status: Within Functional Limits for tasks assessed  General Comments: HOH but plesant and cooperative.        Exercises      General Comments        Pertinent Vitals/Pain Pain Assessment Pain Assessment: Faces Faces Pain Scale: Hurts even more Pain Location: RLE, pt with most pain during sit<>stand  transfers Pain Descriptors / Indicators: Discomfort, Grimacing, Guarding Pain Intervention(s): Limited activity within patient's tolerance, Monitored during session, Premedicated before session, Ice applied    Home Living Family/patient expects to be discharged to:: Private residence Living Arrangements: Alone   Type of Home: Apartment Home Access: Level entry       Home Layout: One level Home Equipment: Rollator (4 wheels) Additional Comments: Pt from [place] at [facility], lives alone    Prior Function            PT Goals (current goals can now be found in the care plan section) Acute Rehab PT Goals Patient Stated Goal: decreased pain, return to independent PLOF PT Goal Formulation: With patient Time For Goal Achievement: 03/10/23 Potential to Achieve Goals: Fair Progress towards PT goals: Progressing toward goals    Frequency    Min 3X/week      PT Plan Current plan remains appropriate    Co-evaluation              AM-PAC PT "6 Clicks" Mobility   Outcome Measure  Help needed turning from your back to your side while in a flat bed without using bedrails?: A Little Help needed moving from lying on your back to sitting on the side of a flat bed without using bedrails?: A Little Help needed moving to and from a bed to a chair (including a wheelchair)?: A Little Help needed standing up from a chair using your arms (e.g., wheelchair or bedside chair)?: A Lot Help needed to walk in hospital room?: Total Help needed climbing 3-5 steps with a railing? : Total 6 Click Score: 13    End of Session Equipment Utilized During Treatment: Gait belt Activity Tolerance: Patient tolerated treatment well;Patient limited by pain Patient left: in chair;with call bell/phone within reach;with chair alarm set Nurse Communication: Mobility status PT Visit Diagnosis: Pain;Other abnormalities of gait and mobility (R26.89);Difficulty in walking, not elsewhere  classified (R26.2);Muscle weakness (generalized) (M62.81);Unsteadiness on feet (R26.81) Pain - Right/Left: Right Pain - part of body: Hip     Time: [id number] PT Time Calculation (min) (ACUTE ONLY): 35 min  Charges:  $Gait Training: 8-22 mins                     [place] Pager [phone] Office [phone]    Laura Hancock 02/25/2023, 12:57 PM

## 2023-02-25 NOTE — TOC Initial Note (Signed)
Transition of Care [facility]) - Initial/Assessment Note    Patient Details  Name: Laura Hancock MRN: [id number] Date of Birth: November 03, 1924  [facility]) CM/SW Contact:    Dessa Phi, RN Phone Number: 02/25/2023, 4:41 PM  Clinical Narrative: [facility]. PT recc [facility] SN[facility]-Spoke to patient/dtr Connie-both agree to [facility] SNF-[facility]st choice [facility]-rep Babetta/or any admission coordinator not available over weekend-left vm for return f/u acceptance.[facility]st choice [facility],2nd choice [facility] where Catering manager) lives. Fl2 faxed await bed offers prior auth.                  Expected Discharge Plan: Skilled Nursing Facility Barriers to Discharge: Continued Medical Work up   Patient Goals and CMS Choice            Expected Discharge Plan and Services                                              Prior Living Arrangements/Services                       Activities of Daily Living Home Assistive Devices/Equipment: Hearing aid, Gilford Rile (specify type) ADL Screening (condition at time of admission) Patient's cognitive ability adequate to safely complete daily activities?: Yes Is the patient deaf or have difficulty hearing?: Yes Does the patient have difficulty seeing, even when wearing glasses/contacts?: No Does the patient have difficulty concentrating, remembering, or making decisions?: No Patient able to express need for assistance with ADLs?: Yes Does the patient have difficulty dressing or bathing?: No Independently performs ADLs?: Yes (appropriate for developmental age) Does the patient have difficulty walking or climbing stairs?: Yes Weakness of Legs: Right Weakness of Arms/Hands: None  Permission Sought/Granted                  Emotional Assessment              Admission diagnosis:  Closed fracture of right hip, initial encounter ([place]) [S72.001A] Community acquired pneumonia of left lower lobe of  lung [J18.9] 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, initial encounter [id number], [place] Patient Active Problem List   Diagnosis Date Noted   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, initial encounter 02/24/2023   DNR (do not resuscitate)/DNI(Do Not Intubate) 02/24/2023   LLL pneumonia 02/24/2023   Insomnia 02/24/2023   PCP:  Virgie Dad, MD Pharmacy:   [id number] [address] [place] [address] Phone: [phone] Fax: [phone]     Social Determinants of Health (SDOH) Social History: [place]: No Food Insecurity (02/24/2023)  Housing: Low Risk  (02/24/2023)  Transportation Needs: No Transportation Needs (02/24/2023)  Utilities: Not At Risk (02/24/2023)  Tobacco Use: Low Risk  (02/24/2023)   SDOH Interventions: Food Insecurity Interventions: Intervention Not Indicated Housing Interventions: Intervention Not Indicated Transportation Interventions: Intervention Not Indicated Utilities Interventions: Intervention Not Indicated   Readmission Risk Interventions     No data to display

## 2023-02-25 NOTE — Evaluation (Signed)
Occupational Therapy Evaluation Patient Details Name: Laura Hancock MRN: [id number] DOB: 1923-12-18 Today's Date: 02/25/2023   History of Present Illness Pt is a 89 y/o F admitted on 02/24/23 after presenting with a fall at her ILF. X-ray showed periprosthetic femur fx around prior R THA. Orthopedics was consulted & pt is 50% weight bearing with no active abduction on RLE x 6 weeks. Pt also found to have incidental PNA on chest x-ray. PMH: DDD, HLD, carotid stenosis, insomnia, CKD 3A   Clinical Impression   Patient is a 89 year old female who was admitted for above. Patient was living independently at [place] prior to admission. Currently, patients increased pain in R hip, reduced WB status, decreased safety awareness, decreased knowledge of AE/AD impacting participation in ADLs. Patient would need 24/7 caregiver physical support to transition back home at this time. Patient would continue to benefit from skilled OT services at this time while admitted and after d/c to address noted deficits in order to improve overall safety and independence in ADLs.       Recommendations for follow up therapy are one component of a multi-disciplinary discharge planning process, led by the attending physician.  Recommendations may be updated based on patient status, additional functional criteria and insurance authorization.   Follow Up Recommendations  Skilled nursing-short term rehab (<3 hours/day)     Assistance Recommended at Discharge Frequent or constant Supervision/Assistance  Patient can return home with the following A lot of help with bathing/dressing/bathroom;Assistance with cooking/housework;Direct supervision/assist for medications management;Assist for transportation;Help with stairs or ramp for entrance;Direct supervision/assist for financial management;A lot of help with walking and/or transfers    Functional Status Assessment  Patient has had a recent decline in their functional status and  demonstrates the ability to make significant improvements in function in a reasonable and predictable amount of time.  Equipment Recommendations  None recommended by OT       Precautions / Restrictions Precautions Precautions: Fall Restrictions Weight Bearing Restrictions: Yes RLE Weight Bearing: Partial weight bearing RLE Partial Weight Bearing Percentage or Pounds: 50 Other Position/Activity Restrictions: no active hip ABduction      Mobility Bed Mobility Overal bed mobility: Needs Assistance Bed Mobility: Supine to Sit     Supine to sit: HOB elevated, Min assist     General bed mobility comments: cues for sequence with assist to manage R LE, to control trunk and to complete rotation to EOB sitting with use of bedpad.            ADL either performed or assessed with clinical judgement   ADL Overall ADL's : Needs assistance/impaired Eating/Feeding: Modified independent;Sitting   Grooming: Set up;Sitting   Upper Body Bathing: Set up;Sitting   Lower Body Bathing: Maximal assistance;Bed level   Upper Body Dressing : Set up;Sitting   Lower Body Dressing: Maximal assistance;Bed level Lower Body Dressing Details (indicate cue type and reason): unable to actively abduct impacting ability to complete LB dressing. Toilet Transfer: +2 for safety/equipment;Minimal assistance;Rolling walker (2 wheels);Ambulation Toilet Transfer Details (indicate cue type and reason): pain impacting participation. also cues for WB restrictions Toileting- Clothing Manipulation and Hygiene: Maximal assistance;Sit to/from stand               Vision   Vision Assessment?: No apparent visual deficits            Pertinent Vitals/Pain Pain Assessment Pain Assessment: Faces Faces Pain Scale: Hurts even more Pain Location: RLE, pt with most pain during sit<>stand transfers Pain Descriptors /  Indicators: Discomfort, Grimacing, Guarding Pain Intervention(s): Limited activity within  patient's tolerance, Monitored during session, Repositioned, Ice applied     Hand Dominance     Extremity/Trunk Assessment Upper Extremity Assessment Upper Extremity Assessment: Overall WFL for tasks assessed (signs of arthritis in hands and digits.)   Lower Extremity Assessment Lower Extremity Assessment: Defer to PT evaluation   Cervical / Trunk Assessment Cervical / Trunk Assessment: Normal   Communication Communication Communication: HOH   Cognition Arousal/Alertness: Awake/alert Behavior During Therapy: WFL for tasks assessed/performed Overall Cognitive Status: Within Functional Limits for tasks assessed     General Comments: HOH but plesant and cooperative.                Home Living Family/patient expects to be discharged to:: Private residence Living Arrangements: Alone   Type of Home: Apartment Home Access: Level entry     Home Layout: One level               Home Equipment: Rollator (4 wheels)   Additional Comments: Pt from [place] at [facility], lives alone      Prior Functioning/Environment Prior Level of Function : Driving;Independent/Modified Independent               ADLs Comments: Pt was independent without AD, furniture walked in her apartment, used rollator outside of the apartment, still driving, denies other falls.        OT Problem List: Decreased activity tolerance;Impaired balance (sitting and/or standing);Pain;Decreased knowledge of precautions;Decreased safety awareness;Decreased knowledge of use of DME or AE      OT Treatment/Interventions: Self-care/ADL training;Energy conservation;DME and/or AE instruction;Therapeutic exercise;Therapeutic activities;Patient/family education;Balance training    OT Goals(Current goals can be found in the care plan section) Acute Rehab OT Goals Patient Stated Goal: to get better OT Goal Formulation: With patient Time For Goal Achievement: 03/11/23 Potential to Achieve  Goals: Fair  OT Frequency: Min 2X/week    Co-evaluation PT/OT/SLP Co-Evaluation/Treatment: Yes Reason for Co-Treatment: For patient/therapist safety;To address functional/ADL transfers PT goals addressed during session: Mobility/safety with mobility OT goals addressed during session: ADL's and self-care      AM-PAC OT "6 Clicks" Daily Activity     Outcome Measure Help from another person eating meals?: None Help from another person taking care of personal grooming?: A Little Help from another person toileting, which includes using toliet, bedpan, or urinal?: A Lot Help from another person bathing (including washing, rinsing, drying)?: A Lot Help from another person to put on and taking off regular upper body clothing?: A Little Help from another person to put on and taking off regular lower body clothing?: A Lot 6 Click Score: 16   End of Session Equipment Utilized During Treatment: Gait belt;Rolling walker (2 wheels) Nurse Communication: Patient requests pain meds  Activity Tolerance: Patient limited by pain Patient left: in chair;with call bell/phone within reach;with chair alarm set  OT Visit Diagnosis: Unsteadiness on feet (R26.81);Other abnormalities of gait and mobility (R26.89);Muscle weakness (generalized) (M62.81);History of falling (Z91.81);Pain                Time: 1130-1204 OT Time Calculation (min): 34 min Charges:  OT General Charges $OT Visit: 1 Visit OT Evaluation $OT Eval Moderate Complexity: 1 Mod  Niambi Smoak OTR/L, MS Acute Rehabilitation Department Office# [phone]   Willa Rough 02/25/2023, 1:14 PM

## 2023-02-25 NOTE — Progress Notes (Signed)
Physical Therapy Treatment Patient Details Name: Laura Hancock MRN: [id number] DOB: 1924-02-23 Today's Date: 02/25/2023   History of Present Illness Pt is a 89 y/o F admitted on 02/24/23 after presenting with a fall at her [facility]F. X-ray showed periprosthetic femur fx around prior R THA. Orthopedics was consulted & pt is 50% weight bearing with no active abduction on RLE x 6 weeks. Pt also found to have incidental PNA on chest x-ray. PMH: DDD, HLD, carotid stenosis, insomnia, CKD 3A    PT Comments    Pt continues very cooperative and progressing steadily with mobility but requiring increased time for all tasks and step-by-step cues for safe completion of tasks and follow through with PWB and no active abduction restrictions.   Recommendations for follow up therapy are one component of a multi-disciplinary discharge planning process, led by the attending physician.  Recommendations may be updated based on patient status, additional functional criteria and insurance authorization.  Follow Up Recommendations  Skilled nursing-short term rehab (<3 hours/day) Can patient physically be transported by private vehicle: Yes   Assistance Recommended at Discharge Intermittent Supervision/Assistance  Patient can return home with the following A little help with walking and/or transfers;A little help with bathing/dressing/bathroom;Assistance with cooking/housework;Assist for transportation;Help with stairs or ramp for entrance   Equipment Recommendations  None recommended by PT    Recommendations for Other Services OT consult     Precautions / Restrictions Precautions Precautions: Fall Restrictions Weight Bearing Restrictions: Yes RLE Weight Bearing: Partial weight bearing RLE Partial Weight Bearing Percentage or Pounds: 50 Other Position/Activity Restrictions: no active hip ABduction     Mobility  Bed Mobility Overal bed mobility: Needs Assistance Bed Mobility: Sit to Supine     Supine to  sit: HOB elevated, Min assist Sit to supine: Mod assist   General bed mobility comments: cues for sequence with assist to control trunk and to manage Bil LEs avoiding active abd on R    Transfers Overall transfer level: Needs assistance Equipment used: Rolling walker (2 wheels) Transfers: Sit to/from Stand Sit to Stand: Min assist           General transfer comment: cues for LE management and use of UEs to self assist.  Physical assist to bring wt up and fwd and to balance in intial standing with RW    Ambulation/Gait Ambulation/Gait assistance: Min assist Gait Distance (Feet): 16 Feet Assistive device: Rolling walker (2 wheels) Gait Pattern/deviations: Step-to pattern, Decreased step length - left, Decreased step length - right, Decreased stride length, Decreased dorsiflexion - right, Decreased dorsiflexion - left, Decreased weight shift to right Gait velocity: decreased     General Gait Details: Increased time with step-by-step cues for sequence, posture, and position from RW.  Cues for UE WB to follow PWB   Stairs             Wheelchair Mobility    Modified Rankin (Stroke Patients Only)       Balance Overall balance assessment: Needs assistance Sitting-balance support: Feet supported, Bilateral upper extremity supported Sitting balance-Leahy Scale: Fair     Standing balance support: Single extremity supported Standing balance-Leahy Scale: Fair                              Cognition Arousal/Alertness: Awake/alert Behavior During Therapy: WFL for tasks assessed/performed Overall Cognitive Status: Within Functional Limits for tasks assessed  General Comments: HOH but plesant and cooperative.        Exercises General Exercises - Lower Extremity Ankle Circles/Pumps: AROM, Both, 15 reps, Supine Quad Sets: AROM, Both, 10 reps, Supine Heel Slides: AAROM, Right, 15 reps, Supine    General  Comments        Pertinent Vitals/Pain Pain Assessment Pain Assessment: 0-10 Pain Score: 5  Faces Pain Scale: Hurts even more Pain Location: R LE with actitivity Pain Descriptors / Indicators: Discomfort, Grimacing, Guarding Pain Intervention(s): Limited activity within patient's tolerance, Monitored during session, Premedicated before session, Ice applied    Home Living                          Prior Function            PT Goals (current goals can now be found in the care plan section) Acute Rehab PT Goals Patient Stated Goal: decreased pain, return to independent PLOF PT Goal Formulation: With patient Time For Goal Achievement: 03/10/23 Potential to Achieve Goals: Fair Progress towards PT goals: Progressing toward goals    Frequency    Min 3X/week      PT Plan Current plan remains appropriate    Co-evaluation   Reason for Co-Treatment: For patient/therapist safety;To address functional/ADL transfers PT goals addressed during session: Mobility/safety with mobility OT goals addressed during session: ADL's and self-care      AM-PAC PT "6 Clicks" Mobility   Outcome Measure  Help needed turning from your back to your side while in a flat bed without using bedrails?: A Little Help needed moving from lying on your back to sitting on the side of a flat bed without using bedrails?: A Little Help needed moving to and from a bed to a chair (including a wheelchair)?: A Little Help needed standing up from a chair using your arms (e.g., wheelchair or bedside chair)?: A Lot Help needed to walk in hospital room?: Total Help needed climbing 3-5 steps with a railing? : Total 6 Click Score: 13    End of Session Equipment Utilized During Treatment: Gait belt Activity Tolerance: Patient tolerated treatment well;Patient limited by pain;Patient limited by fatigue Patient left: in bed;with call bell/phone within reach;with bed alarm set Nurse Communication: Mobility  status PT Visit Diagnosis: Pain;Other abnormalities of gait and mobility (R26.89);Difficulty in walking, not elsewhere classified (R26.2);Muscle weakness (generalized) (M62.81);Unsteadiness on feet (R26.81) Pain - Right/Left: Right Pain - part of body: Hip     Time: 1430-1502 PT Time Calculation (min) (ACUTE ONLY): 32 min  Charges:  $Gait Training: 8-22 mins $Therapeutic Exercise: 8-22 mins                     [place] Pager [phone] Office [phone]    Liberta Gimpel 02/25/2023, 4:04 PM

## 2023-02-26 DIAGNOSIS — Z96649 Presence of unspecified artificial hip joint: Secondary | ICD-10-CM | POA: Diagnosis not present

## 2023-02-26 DIAGNOSIS — M978XXA Periprosthetic fracture around other internal prosthetic joint, initial encounter: Secondary | ICD-10-CM | POA: Diagnosis not present

## 2023-02-26 MED ORDER — MELATONIN 3 MG PO TABS
3.0000 mg | ORAL_TABLET | Freq: Every evening | ORAL | Status: AC | PRN
  Administered 2023-02-26 – 2023-02-28 (×2): 3 mg via ORAL
  Filled 2023-02-26 (×2): qty 1

## 2023-02-26 NOTE — Progress Notes (Signed)
Physical Therapy Treatment Patient Details Name: Laura Hancock MRN: [id number] DOB: [birth year] Today's Date: 02/26/2023   History of Present Illness Pt is a 89 y/o F admitted on 02/24/23 after presenting with a fall at her [facility]F. X-ray showed periprosthetic femur fx around prior R THA. Orthopedics was consulted & pt is 50% weight bearing with no active abduction on RLE x 6 weeks. Pt also found to have incidental PNA on chest x-ray. PMH: DDD, HLD, carotid stenosis, insomnia, CKD 3A    PT Comments    Pt continues very cooperative and assisted to stand from chair with min assist.  Pt in standing ~ 10 min with onset of intermittent urinary incontinence -  Purewick in place on standing and output ~ 486ml.  Pt assisted to ambulate short distance to transfer to bed.  Pt hopeful for dc to [facility] rehab tomorrow.  Recommendations for follow up therapy are one component of a multi-disciplinary discharge planning process, led by the attending physician.  Recommendations may be updated based on patient status, additional functional criteria and insurance authorization.  Follow Up Recommendations  Skilled nursing-short term rehab (<3 hours/day) Can patient physically be transported by private vehicle: No   Assistance Recommended at Discharge Intermittent Supervision/Assistance  Patient can return home with the following A little help with walking and/or transfers;A little help with bathing/dressing/bathroom;Assistance with cooking/housework;Assist for transportation;Help with stairs or ramp for entrance   Equipment Recommendations  None recommended by PT    Recommendations for Other Services OT consult     Precautions / Restrictions Precautions Precautions: Fall Restrictions Weight Bearing Restrictions: Yes RLE Weight Bearing: Partial weight bearing RLE Partial Weight Bearing Percentage or Pounds: 50     Mobility  Bed Mobility Overal bed mobility: Needs Assistance Bed Mobility: Sit to  Supine     Supine to sit: HOB elevated, Min assist Sit to supine: Mod assist   General bed mobility comments: cues for sequence with assist to control trunk and to manage Bil LEs avoiding active abd on R    Transfers Overall transfer level: Needs assistance Equipment used: Rolling walker (2 wheels) Transfers: Sit to/from Stand Sit to Stand: Min assist           General transfer comment: cues for LE management and use of UEs to self assist.  Physical assist to bring wt up and fwd and to balance in intial standing with RW    Ambulation/Gait Ambulation/Gait assistance: Min assist Gait Distance (Feet): 5 Feet Assistive device: Rolling walker (2 wheels) Gait Pattern/deviations: Step-to pattern, Decreased step length - left, Decreased step length - right, Decreased stride length, Decreased dorsiflexion - right, Decreased dorsiflexion - left, Decreased weight shift to right Gait velocity: decreased     General Gait Details: Increased time with step-by-step cues for sequence, posture, and position from RW.  Cues for UE WB to follow PWB   Stairs             Wheelchair Mobility    Modified Rankin (Stroke Patients Only)       Balance Overall balance assessment: Needs assistance Sitting-balance support: Feet supported, Bilateral upper extremity supported Sitting balance-Leahy Scale: Fair     Standing balance support: Single extremity supported Standing balance-Leahy Scale: Fair                              Cognition Arousal/Alertness: Awake/alert Behavior During Therapy: WFL for tasks assessed/performed Overall Cognitive Status: Within Functional Limits  for tasks assessed                                 General Comments: HOH but plesant and cooperative.        Exercises General Exercises - Lower Extremity Ankle Circles/Pumps: AROM, Both, 15 reps, Supine Quad Sets: AROM, Both, 10 reps, Supine Long Arc Quad: AROM, Seated,  Strengthening, Right, 10 reps Heel Slides: AAROM, Right, 15 reps, Supine    General Comments        Pertinent Vitals/Pain Pain Assessment Pain Assessment: 0-10 Pain Score: 4  Pain Location: R LE with actitivity Pain Descriptors / Indicators: Discomfort, Sore Pain Intervention(s): Limited activity within patient's tolerance, Monitored during session, Premedicated before session    Home Living                          Prior Function            PT Goals (current goals can now be found in the care plan section) Acute Rehab PT Goals Patient Stated Goal: decreased pain, return to independent PLOF PT Goal Formulation: With patient Time For Goal Achievement: 03/10/23 Potential to Achieve Goals: Fair Progress towards PT goals: Progressing toward goals    Frequency    Min 3X/week      PT Plan Current plan remains appropriate    Co-evaluation              AM-PAC PT "6 Clicks" Mobility   Outcome Measure  Help needed turning from your back to your side while in a flat bed without using bedrails?: A Little Help needed moving from lying on your back to sitting on the side of a flat bed without using bedrails?: A Little Help needed moving to and from a bed to a chair (including a wheelchair)?: A Little Help needed standing up from a chair using your arms (e.g., wheelchair or bedside chair)?: A Little Help needed to walk in hospital room?: A Lot Help needed climbing 3-5 steps with a railing? : Total 6 Click Score: 15    End of Session Equipment Utilized During Treatment: Gait belt Activity Tolerance: Patient tolerated treatment well;Patient limited by fatigue Patient left: with call bell/phone within reach;with bed alarm set;in bed Nurse Communication: Mobility status PT Visit Diagnosis: Pain;Other abnormalities of gait and mobility (R26.89);Difficulty in walking, not elsewhere classified (R26.2);Muscle weakness (generalized) (M62.81);Unsteadiness on feet  (R26.81) Pain - Right/Left: Right Pain - part of body: Hip     Time: [id number] PT Time Calculation (min) (ACUTE ONLY): 28 min  Charges:  $Gait Training: 8-22 mins $Therapeutic Exercise: 8-22 mins $Therapeutic Activity: 8-22 mins                     Debe Coder PT Acute Rehabilitation Services Pager [phone] Office [phone]    Leibish Mcgregor 02/26/2023, 3:02 PM

## 2023-02-26 NOTE — Plan of Care (Signed)
  Problem: Education: Goal: Knowledge of General Education information will improve Description Including pain rating scale, medication(s)/side effects and non-pharmacologic comfort measures Outcome: Progressing   

## 2023-02-26 NOTE — Plan of Care (Signed)

## 2023-02-26 NOTE — Progress Notes (Signed)
[facility] Progress Note  Patient: Laura Hancock     [id number]  DOA: 02/24/2023   PCP: Virgie Dad, MD       Brief hospital course: 89 y/o female presents after a fall an is found to have a right periprosthetic fracture and an incidental infiltrate in her LLL. Started on Ceftriaxone and Azithromycin and ortho consulted.   Subjective:  Awake alert oriented, complaining of pain, tenderness right hip area, Tolerable with pain medication Hemodynamically stable Denies any chest pain or shortness of breath  Assessment and Plan: Principal Problem:   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, initial encounter -Not a surgical candidate - conservative management - only ambulated a short distance -Continue with PT evaluation -appreciate recommendations - will return to [facility] but the SNF part- FL2 done today - cont PRN Hydrocodone which she states helped a great deal yesterday  Active Problems:      LLL infiltrate - will treat with 5 days of antibiotics for pneumonia - cont Cefdinir and Azithromycin    Insomnia - Ambien     Code Status: DNR Consultants: ortho Level of Care: Level of care: Med-Surg Total time on patient care: 35 min DVT prophylaxis:  SCDs Start: 02/24/23 0416     Objective:   Vitals:   02/25/23 0607 02/25/23 1430 02/26/23 0500 02/26/23 0529  BP: (!) 113/49 (!) 94/53  (!) 108/51  Pulse: 60 70  73  Resp: 16 18  16   Temp: 97.8 F (36.6 C) 97.9 F (36.6 C)  98.4 F (36.9 C)  TempSrc: Oral Oral  Oral  SpO2: 95% 99%  96%  Weight:   48.1 kg   Height:       Filed Weights   02/24/23 1700 02/26/23 0500  Weight: 45.8 kg 48.1 kg   Exam:   General:  AAO x 3,  cooperative, no distress;   HEENT:  Normocephalic, PERRL, otherwise with in Normal limits   Neuro:  CNII-XII intact. , normal motor and sensation, reflexes intact   Lungs:   Clear to auscultation BL, Respirations unlabored,  No wheezes / crackles  Cardio:     S1/S2, RRR, No murmure, No Rubs or Gallops   Abdomen:  Soft, non-tender, bowel sounds active all four quadrants, no guarding or peritoneal signs.  Muscular  skeletal:  Right hip tenderness -limited range of motion due to pain Sensation intact Limited exam -global generalized weaknesses - in bed, able to move all 4 extremities,   2+ pulses,  symmetric, No pitting edema  Skin:  Dry, warm to touch, negative for any Rashes,  Wounds: Please see nursing documentation       CBC: Recent Labs  Lab 02/24/23 0120  WBC 13.6*  NEUTROABS 11.9*  HGB 10.4*  HCT 32.1*  MCV 98.2  PLT 0000000   Basic Metabolic Panel: Recent Labs  Lab 02/24/23 0120  NA 136  K 4.1  CL 101  CO2 24  GLUCOSE 160*  BUN 16  CREATININE 0.80  CALCIUM 8.7*   GFR: Estimated Creatinine Clearance: 31.2 mL/min (by C-G formula based on SCr of 0.8 mg/dL).  Scheduled Meds:  azithromycin  500 mg Oral Daily   cefdinir  300 mg Oral Q12H   citalopram  10 mg Oral Daily   latanoprost  1 drop Both Eyes QHS   senna  1 tablet Oral Daily   zolpidem  5 mg Oral Once   Continuous Infusions: Imaging and lab data was personally reviewed No results found.  LOS: 2 days   Author: Deatra James  MD  02/26/2023 10:18 AM  To contact [facility]>   Check the care team in [facility] and look for the attending/consulting [place] provider listed  Log into [email or web address] and use [facility] universal password   Go to> "[facility]"  and find provider  If you still have difficulty reaching the provider, please page the [facility] (Director on Call) for the Hospitalists listed on amion

## 2023-02-26 NOTE — Progress Notes (Signed)
Physical Therapy Treatment Patient Details Name: Laura Hancock MRN: [id number] DOB: 10-14-1924 Today's Date: 02/26/2023   History of Present Illness Pt is a 89 y/o F admitted on 02/24/23 after presenting with a fall at her [facility]F. X-ray showed periprosthetic femur fx around prior R THA. Orthopedics was consulted & pt is 50% weight bearing with no active abduction on RLE x 6 weeks. Pt also found to have incidental PNA on chest x-ray. PMH: DDD, HLD, carotid stenosis, insomnia, CKD 3A    PT Comments    Pt very pleasant and cooperative and progressing steadily with mobility.  Pt performed therex program and up to ambulate limited distance into hall.  Dtr present for session.  Recommendations for follow up therapy are one component of a multi-disciplinary discharge planning process, led by the attending physician.  Recommendations may be updated based on patient status, additional functional criteria and insurance authorization.  Follow Up Recommendations  Skilled nursing-short term rehab (<3 hours/day) Can patient physically be transported by private vehicle: No   Assistance Recommended at Discharge Intermittent Supervision/Assistance  Patient can return home with the following A little help with walking and/or transfers;A little help with bathing/dressing/bathroom;Assistance with cooking/housework;Assist for transportation;Help with stairs or ramp for entrance   Equipment Recommendations  None recommended by PT    Recommendations for Other Services OT consult     Precautions / Restrictions Precautions Precautions: Fall Restrictions Weight Bearing Restrictions: Yes RLE Weight Bearing: Partial weight bearing RLE Partial Weight Bearing Percentage or Pounds: 50     Mobility  Bed Mobility Overal bed mobility: Needs Assistance Bed Mobility: Supine to Sit     Supine to sit: HOB elevated, Min assist     General bed mobility comments: cues for sequence with assist to control trunk and to  manage Bil LEs avoiding active abd on R    Transfers Overall transfer level: Needs assistance Equipment used: Rolling walker (2 wheels) Transfers: Sit to/from Stand Sit to Stand: Min assist           General transfer comment: cues for LE management and use of UEs to self assist.  Physical assist to bring wt up and fwd and to balance in intial standing with RW    Ambulation/Gait Ambulation/Gait assistance: Min assist Gait Distance (Feet): 22 Feet Assistive device: Rolling walker (2 wheels) Gait Pattern/deviations: Step-to pattern, Decreased step length - left, Decreased step length - right, Decreased stride length, Decreased dorsiflexion - right, Decreased dorsiflexion - left, Decreased weight shift to right Gait velocity: decreased     General Gait Details: Increased time with step-by-step cues for sequence, posture, and position from RW.  Cues for UE WB to follow PWB   Stairs             Wheelchair Mobility    Modified Rankin (Stroke Patients Only)       Balance Overall balance assessment: Needs assistance Sitting-balance support: Feet supported, Bilateral upper extremity supported Sitting balance-Leahy Scale: Fair     Standing balance support: Single extremity supported Standing balance-Leahy Scale: Fair                              Cognition Arousal/Alertness: Awake/alert Behavior During Therapy: WFL for tasks assessed/performed Overall Cognitive Status: Within Functional Limits for tasks assessed  General Comments: HOH but plesant and cooperative.        Exercises General Exercises - Lower Extremity Ankle Circles/Pumps: AROM, Both, 15 reps, Supine Quad Sets: AROM, Both, 10 reps, Supine Long Arc Quad: AROM, Seated, Strengthening, Right, 10 reps Heel Slides: AAROM, Right, 15 reps, Supine    General Comments        Pertinent Vitals/Pain Pain Assessment Pain Assessment: 0-10 Pain  Score: 4  Pain Location: R LE with actitivity Pain Descriptors / Indicators: Discomfort, Grimacing, Guarding Pain Intervention(s): Limited activity within patient's tolerance, Premedicated before session, Monitored during session    Home Living                          Prior Function            PT Goals (current goals can now be found in the care plan section) Acute Rehab PT Goals Patient Stated Goal: decreased pain, return to independent PLOF PT Goal Formulation: With patient Time For Goal Achievement: 03/10/23 Potential to Achieve Goals: Fair Progress towards PT goals: Progressing toward goals    Frequency    Min 3X/week      PT Plan Current plan remains appropriate    Co-evaluation              AM-PAC PT "6 Clicks" Mobility   Outcome Measure  Help needed turning from your back to your side while in a flat bed without using bedrails?: A Little Help needed moving from lying on your back to sitting on the side of a flat bed without using bedrails?: A Little Help needed moving to and from a bed to a chair (including a wheelchair)?: A Little Help needed standing up from a chair using your arms (e.g., wheelchair or bedside chair)?: A Little Help needed to walk in hospital room?: A Lot Help needed climbing 3-5 steps with a railing? : Total 6 Click Score: 15    End of Session Equipment Utilized During Treatment: Gait belt Activity Tolerance: Patient tolerated treatment well;Patient limited by fatigue Patient left: in chair;with call bell/phone within reach;with chair alarm set;with family/visitor present Nurse Communication: Mobility status PT Visit Diagnosis: Pain;Other abnormalities of gait and mobility (R26.89);Difficulty in walking, not elsewhere classified (R26.2);Muscle weakness (generalized) (M62.81);Unsteadiness on feet (R26.81) Pain - Right/Left: Right Pain - part of body: Hip     Time: [id number] PT Time Calculation (min) (ACUTE ONLY): 32  min  Charges:  $Gait Training: 8-22 mins $Therapeutic Exercise: 8-22 mins                     [place] Pager [phone] Office [phone]    [facility] 02/26/2023, 12:36 PM

## 2023-02-27 DIAGNOSIS — Z96649 Presence of unspecified artificial hip joint: Secondary | ICD-10-CM | POA: Diagnosis not present

## 2023-02-27 DIAGNOSIS — M978XXA Periprosthetic fracture around other internal prosthetic joint, initial encounter: Secondary | ICD-10-CM | POA: Diagnosis not present

## 2023-02-27 MED ORDER — LIP MEDEX EX OINT: 1.0000 | TOPICAL_OINTMENT | CUTANEOUS | Status: DC | PRN

## 2023-02-27 MED ORDER — ORAL CARE MOUTH RINSE: 15.0000 mL | OROMUCOSAL | Status: DC | PRN

## 2023-02-27 NOTE — Progress Notes (Signed)
Physical Therapy Treatment Patient Details Name: Laura Hancock MRN: [id number] DOB: 1924/11/06 Today's Date: 02/27/2023   History of Present Illness Pt is a 89 y/o F admitted on 02/24/23 after presenting with a fall at her [facility]. X-ray showed periprosthetic femur fx around prior R THA. Orthopedics was consulted & pt is 50% weight bearing with no active abduction on RLE x 6 weeks. Pt also found to have incidental PNA on chest x-ray. PMH: DDD, HLD, carotid stenosis, insomnia, CKD 3A    PT Comments    Pt assisted with ambulating and required increased time to perform only short distance.  Continue to recommend SNF upon d/c.    Recommendations for follow up therapy are one component of a multi-disciplinary discharge planning process, led by the attending physician.  Recommendations may be updated based on patient status, additional functional criteria and insurance authorization.  Follow Up Recommendations  Skilled nursing-short term rehab (<3 hours/day) Can patient physically be transported by private vehicle: No   Assistance Recommended at Discharge Intermittent Supervision/Assistance  Patient can return home with the following A little help with walking and/or transfers;A little help with bathing/dressing/bathroom;Assistance with cooking/housework;Assist for transportation;Help with stairs or ramp for entrance   Equipment Recommendations  None recommended by PT    Recommendations for Other Services       Precautions / Restrictions Precautions Precautions: Fall Precaution Comments: HOH Restrictions Weight Bearing Restrictions: Yes RLE Weight Bearing: Partial weight bearing RLE Partial Weight Bearing Percentage or Pounds: 50 Other Position/Activity Restrictions: no active hip ABduction     Mobility  Bed Mobility               General bed mobility comments: pt in recliner    Transfers Overall transfer level: Needs assistance Equipment used: Rolling walker (2  wheels) Transfers: Sit to/from Stand Sit to Stand: Min assist           General transfer comment: cues for UE and LE positioning, assist to rise and steady    Ambulation/Gait Ambulation/Gait assistance: Min assist Gait Distance (Feet): 15 Feet Assistive device: Rolling walker (2 wheels) Gait Pattern/deviations: Step-to pattern, Decreased stance time - right, Antalgic Gait velocity: decreased     General Gait Details: increased time required, step by step cues for sequence and posture; cues for maintaining PWB with weight through [employer]             Wheelchair Mobility    Modified Rankin (Stroke Patients Only)       Balance                                            Cognition Arousal/Alertness: Awake/alert Behavior During Therapy: WFL for tasks assessed/performed Overall Cognitive Status: Within Functional Limits for tasks assessed                                 General Comments: HOH but plesant and cooperative.        Exercises General Exercises - Lower Extremity Ankle Circles/Pumps: AROM, Both, 15 reps    General Comments        Pertinent Vitals/Pain Pain Assessment Pain Assessment: 0-10 Pain Score: 3  Pain Location: R LE with activity Pain Descriptors / Indicators: Discomfort, Sore Pain Intervention(s): Repositioned, Monitored during session, Premedicated before session    Home Living  Prior Function            PT Goals (current goals can now be found in the care plan section) Progress towards PT goals: Progressing toward goals    Frequency    Min 3X/week      PT Plan Current plan remains appropriate    Co-evaluation              AM-PAC PT "6 Clicks" Mobility   Outcome Measure  Help needed turning from your back to your side while in a flat bed without using bedrails?: A Little Help needed moving from lying on your back to sitting on the side of a  flat bed without using bedrails?: A Little Help needed moving to and from a bed to a chair (including a wheelchair)?: A Little Help needed standing up from a chair using your arms (e.g., wheelchair or bedside chair)?: A Little Help needed to walk in hospital room?: A Lot Help needed climbing 3-5 steps with a railing? : Total 6 Click Score: 15    End of Session Equipment Utilized During Treatment: Gait belt Activity Tolerance: Patient tolerated treatment well Patient left: in chair;with call bell/phone within reach;with chair alarm set;with family/visitor present   PT Visit Diagnosis: Difficulty in walking, not elsewhere classified (R26.2);Muscle weakness (generalized) (M62.81)     Time: [id number] PT Time Calculation (min) (ACUTE ONLY): 25 min  Charges:  $Gait Training: 23-37 mins                    Jannette Spanner PT, DPT Physical Therapist Acute Rehabilitation Services Preferred contact method: Secure Chat Weekend Pager Only: [phone] Office: [place] 02/27/2023, 3:17 PM

## 2023-02-27 NOTE — Progress Notes (Signed)
  Progress Note   Patient: Laura Hancock [id number] DOB: 1924-01-12 DOA: 02/24/2023     3 DOS: the patient was seen and examined on 02/27/2023 at 10:00AM      Brief hospital course: Laura Hancock is a 89 y.o. F with CKD IIIa, HLD and carotid stenosis who presented with right hip pain after a fall.  In the ER, imaging showed a right periprosthetic fracture.  Case discussed with Orthopedics who felt that the type of fracture was best managed nonoperatively with 50% WBAT.  Incidentally noted to have LLL infiltrate, started on antibiotics.     Assessment and Plan: * 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, initial encounter - WBAT 50% weight bearing - No active abduction - Follow up with Orthopedics in 2 weeks    LLL pneumonia - Continue empiric antibiotics             Subjective: Patient feels well.  Some low back pain.  No fever, no confusion.     Physical Exam: BP (!) 108/51 (BP Location: Right Arm)   Pulse 70   Temp 98.2 F (36.8 C)   Resp 18   Ht 5\' 2"  (1.575 m)   Wt 50.1 kg   SpO2 96%   BMI 20.20 kg/m   General: Pt is alert, awake, not in acute distress Cardiovascular: RRR, nl S1-S2, no murmurs appreciated.   No LE edema.   Respiratory: Normal respiratory rate and rhythm.  CTAB without rales or wheezes. Abdominal: Abdomen soft and non-tender.  No distension or HSM.   Neuro/Psych: Strength symmetric in upper and lower extremities.  Judgment and insight appear normal.      Disposition: Status is: Inpatient         Author: Edwin Dada, MD 02/27/2023 2:19 PM  For on call review [email or web address].

## 2023-02-27 NOTE — Hospital Course (Addendum)
Laura Hancock is a 89 y.o. F with CKD IIIa, HLD and carotid stenosis who presented with right hip pain after a fall.  In the ER, imaging showed a right periprosthetic fracture.  Case discussed with Orthopedics who felt that the type of fracture was best managed nonoperatively with 50% WBAT.  Incidentally noted to have LLL infiltrate, started on antibiotics.

## 2023-02-27 NOTE — Progress Notes (Signed)
Occupational Therapy Treatment Patient Details Name: Laura Hancock MRN: [id number] DOB: [birth year] Today's Date: 02/27/2023   History of present illness Pt is a 89 y/o F admitted on 02/24/23 after presenting with a fall at her [facility]F. X-ray showed periprosthetic femur fx around prior R THA. Orthopedics was consulted & pt is 50% weight bearing with no active abduction on RLE x 6 weeks. Pt also found to have incidental PNA on chest x-ray. PMH: DDD, HLD, carotid stenosis, insomnia, CKD 3A   OT comments  Patient was able to make progress towards goals with reduced A for transfers. Patient plans to d/c to SNF today. Patient continues to have increased pain and decreased awareness of how to maintain WB restrictions impacting participation in ADLs. Patient's discharge plan remains appropriate at this time. OT will continue to follow acutely.     Recommendations for follow up therapy are one component of a multi-disciplinary discharge planning process, led by the attending physician.  Recommendations may be updated based on patient status, additional functional criteria and insurance authorization.    Follow Up Recommendations  Skilled nursing-short term rehab (<3 hours/day)     Assistance Recommended at Discharge Frequent or constant Supervision/Assistance  Patient can return home with the following  A lot of help with bathing/dressing/bathroom;Assistance with cooking/housework;Direct supervision/assist for medications management;Assist for transportation;Help with stairs or ramp for entrance;Direct supervision/assist for financial management;A lot of help with walking and/or transfers   Equipment Recommendations  None recommended by OT       Precautions / Restrictions Precautions Precautions: Fall Restrictions Weight Bearing Restrictions: Yes RLE Weight Bearing: Partial weight bearing RLE Partial Weight Bearing Percentage or Pounds: 50 Other Position/Activity Restrictions: no active hip  ABduction       Mobility Bed Mobility Overal bed mobility: Needs Assistance Bed Mobility: Sit to Supine     Supine to sit: HOB elevated, Min assist     General bed mobility comments: cues for sequence with assist to control trunk and to manage Bil LEs avoiding active abd on R         Balance Overall balance assessment: Needs assistance Sitting-balance support: Feet supported, Bilateral upper extremity supported Sitting balance-Leahy Scale: Fair     Standing balance support: Single extremity supported Standing balance-Leahy Scale: Poor         ADL either performed or assessed with clinical judgement   ADL Overall ADL's : Needs assistance/impaired     Grooming: Set up;Sitting Grooming Details (indicate cue type and reason): declined to complete in standing.                 Toilet Transfer: Minimal assistance;Ambulation;Rolling walker (2 wheels) Toilet Transfer Details (indicate cue type and reason): to recliner in room with increased cues for WB restrictions                  Cognition Arousal/Alertness: Awake/alert Behavior During Therapy: WFL for tasks assessed/performed Overall Cognitive Status: Within Functional Limits for tasks assessed         General Comments: HOH but plesant and cooperative.                   Pertinent Vitals/ Pain       Pain Assessment Pain Assessment: 0-10 Faces Pain Scale: Hurts even more Pain Location: R LE with actitivity Pain Descriptors / Indicators: Discomfort, Sore Pain Intervention(s): Limited activity within patient's tolerance, Monitored during session, Premedicated before session         Frequency  Min 2X/week  Progress Toward Goals  OT Goals(current goals can now be found in the care plan section)  Progress towards OT goals: Progressing toward goals     Plan Discharge plan remains appropriate       AM-PAC OT "6 Clicks" Daily Activity     Outcome Measure   Help from another  person eating meals?: None Help from another person taking care of personal grooming?: A Little Help from another person toileting, which includes using toliet, bedpan, or urinal?: A Lot Help from another person bathing (including washing, rinsing, drying)?: A Lot Help from another person to put on and taking off regular upper body clothing?: A Little Help from another person to put on and taking off regular lower body clothing?: A Lot 6 Click Score: 16    End of Session Equipment Utilized During Treatment: Gait belt;Rolling walker (2 wheels)  OT Visit Diagnosis: Unsteadiness on feet (R26.81);Other abnormalities of gait and mobility (R26.89);Muscle weakness (generalized) (M62.81);History of falling (Z91.81);Pain   Activity Tolerance Patient limited by pain   Patient Left in chair;with call bell/phone within reach;with chair alarm set   Nurse Communication Patient requests pain meds        Time: [id number] OT Time Calculation (min): 22 min  Charges: OT General Charges $OT Visit: 1 Visit OT Treatments $Self Care/Home Management : 8-22 mins  Rennie Plowman, MS Acute Rehabilitation Department Office# [phone]   Willa Rough 02/27/2023, 1:11 PM

## 2023-02-27 NOTE — TOC Progression Note (Signed)
Transition of Care [facility]) - Progression Note    Patient Details  Name: Laura Hancock MRN: [id number] Date of Birth: May 16, 1924  [facility]) CM/SW Contact  Lennart Pall, LCSW Phone Number: 02/27/2023, 3:52 PM  Clinical Narrative:    Have confirmed that [facility] is able to offer SNF rehab bed for pt and pt/ daughter have accepted.  Have begun insurance authorization.  Will keep team/ pt/ family updated.   Expected Discharge Plan: [place] Barriers to Discharge: Continued Medical Work up  Expected Discharge Plan and Services                                               Social Determinants of Health (SDOH) Interventions SDOH Screenings   Food Insecurity: No Food Insecurity (02/24/2023)  Housing: Low Risk  (02/24/2023)  Transportation Needs: No Transportation Needs (02/24/2023)  Utilities: Not At Risk (02/24/2023)  Tobacco Use: Low Risk  (02/24/2023)    Readmission Risk Interventions     No data to display

## 2023-02-27 NOTE — Care Management Important Message (Signed)
Important Message  Patient Details IM Letter given. Name: Laura Hancock MRN: [id number] Date of Birth: 12-11-1924   Medicare Important Message Given:  Yes     Kerin Salen 02/27/2023, 2:02 PM

## 2023-02-27 NOTE — Plan of Care (Signed)
  Problem: Clinical Measurements: Goal: Ability to maintain clinical measurements within normal limits will improve Outcome: Progressing   Problem: Activity: Goal: Risk for activity intolerance will decrease Outcome: Progressing   Problem: Pain Managment: Goal: General experience of comfort will improve Outcome: Progressing   Problem: Safety: Goal: Ability to remain free from injury will improve Outcome: Progressing   

## 2023-02-28 DIAGNOSIS — Z96649 Presence of unspecified artificial hip joint: Secondary | ICD-10-CM | POA: Diagnosis not present

## 2023-02-28 DIAGNOSIS — M978XXA Periprosthetic fracture around other internal prosthetic joint, initial encounter: Secondary | ICD-10-CM | POA: Diagnosis not present

## 2023-02-28 NOTE — Progress Notes (Signed)
  Progress Note   Patient: Laura Hancock [id number] DOB: 30-Dec-1923 DOA: 02/24/2023     4 DOS: the patient was seen and examined on 02/28/2023        Brief hospital course: Laura Hancock is a 89 y.o. F with CKD IIIa, HLD and carotid stenosis who presented with right hip pain after a fall.  In the ER, imaging showed a right periprosthetic fracture.  Case discussed with Orthopedics who felt that the type of fracture was best managed nonoperatively with 50% WBAT.  Incidentally noted to have LLL infiltrate, started on antibiotics.     Assessment and Plan: * 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, initial encounter - WBAT 50% weight bearing - No active abduction - Follow up with Orthopedics in 2 weeks    LLL pneumonia Completed 5 days antibiotics.             Subjective: Some left hip pain.  No confusion, fever, dyspnea, cough, sputum.     Physical Exam: BP 114/62 (BP Location: Right Arm)   Pulse 74   Temp 98.2 F (36.8 C) (Oral)   Resp 18   Ht 5\' 2"  (1.575 m)   Wt 50.1 kg   SpO2 98%   BMI 20.20 kg/m   General: Pt is alert, awake, not in acute distress Cardiovascular: RRR, nl S1-S2, no murmurs appreciated.   No LE edema.   Respiratory: Normal respiratory rate and rhythm.  CTAB without rales or wheezes. Abdominal: Abdomen soft and non-tender.  No distension or HSM.   Neuro/Psych: Strength symmetric in upper and lower extremities.  Judgment and insight appear normal.      Disposition: Status is: Inpatient         Author: Edwin Dada, MD 02/28/2023 2:37 PM  For on call review [email or web address].

## 2023-02-28 NOTE — Plan of Care (Signed)

## 2023-02-28 NOTE — Progress Notes (Signed)
Physical Therapy Treatment Patient Details Name: Laura Hancock MRN: [id number] DOB: November 11, 1924 Today's Date: 02/28/2023   History of Present Illness Pt is a 89 y/o F admitted on 02/24/23 after presenting with a fall at her ILF. X-ray showed periprosthetic femur fx around prior R THA. Orthopedics was consulted & pt is 50% weight bearing with no active abduction on RLE x 6 weeks. Pt also found to have incidental PNA on chest x-ray. PMH: DDD, HLD, carotid stenosis, insomnia, CKD 3A    PT Comments    Pt is Ax[facility] x 3 [facility].  HOH.  Resides at [facility]. Pt was already OOB in recliner.  Assisted to [facility] first as a precaution then assisted with amb.  General Gait Details: 75% VC's on PWB, proper walker to self distance and 75% VC's safety with turns. Recliner following for safety.   Pt will need ST Rehab at SNF to address mobility and functional decline prior to safely returning home.   Recommendations for follow up therapy are one component of a multi-disciplinary discharge planning process, led by the attending physician.  Recommendations may be updated based on patient status, additional functional criteria and insurance authorization.  Follow Up Recommendations  Skilled nursing-short term rehab (<3 hours/day) Can patient physically be transported by private vehicle: No   Assistance Recommended at Discharge Intermittent Supervision/Assistance  Patient can return home with the following A little help with walking and/or transfers;A little help with bathing/dressing/bathroom;Assistance with cooking/housework;Assist for transportation;Help with stairs or ramp for entrance   Equipment Recommendations  None recommended by PT    Recommendations for Other Services       Precautions / Restrictions Precautions Precautions: Fall Precaution Comments: HOH Restrictions Weight Bearing Restrictions: Yes RLE Weight Bearing: Partial weight bearing RLE Partial Weight Bearing Percentage or  Pounds: 50% Other Position/Activity Restrictions: no active hip ABduction     Mobility  Bed Mobility               General bed mobility comments: OOB in recliner    Transfers Overall transfer level: Needs assistance Equipment used: Rolling walker (2 wheels) Transfers: Sit to/from Stand Sit to Stand: Min assist           General transfer comment: 50%cues for UE and LE positioning, assist to rise and steady.  Also assisted on/off BSC.    Ambulation/Gait Ambulation/Gait assistance: Min assist Gait Distance (Feet): 12 Feet Assistive device: Rolling walker (2 wheels) Gait Pattern/deviations: Step-to pattern, Decreased stance time - right, Antalgic Gait velocity: decreased     General Gait Details: 75% VC's on PWB, proper walker to self distance and 75% VC's safety with turns.   Stairs             Wheelchair Mobility    Modified Rankin (Stroke Patients Only)       Balance                                            Cognition Arousal/Alertness: Awake/alert Behavior During Therapy: WFL for tasks assessed/performed Overall Cognitive Status: Within Functional Limits for tasks assessed                                 General Comments: HOH but plesant and cooperative.        Exercises  General Comments        Pertinent Vitals/Pain Pain Assessment Pain Assessment: Faces Faces Pain Scale: Hurts little more Pain Location: R LE with activity Pain Descriptors / Indicators: Discomfort, Sore Pain Intervention(s): Monitored during session, Repositioned    Home Living                          Prior Function            PT Goals (current goals can now be found in the care plan section) Progress towards PT goals: Progressing toward goals    Frequency    Min 3X/week      PT Plan Current plan remains appropriate    Co-evaluation              AM-PAC PT "6 Clicks" Mobility   Outcome  Measure  Help needed turning from your back to your side while in a flat bed without using bedrails?: A Little Help needed moving from lying on your back to sitting on the side of a flat bed without using bedrails?: A Little Help needed moving to and from a bed to a chair (including a wheelchair)?: A Little Help needed standing up from a chair using your arms (e.g., wheelchair or bedside chair)?: A Lot Help needed to walk in hospital room?: A Lot Help needed climbing 3-5 steps with a railing? : Total 6 Click Score: 14    End of Session Equipment Utilized During Treatment: Gait belt Activity Tolerance: Patient tolerated treatment well Patient left: in chair;with call bell/phone within reach;with chair alarm set;with family/visitor present Nurse Communication: Mobility status PT Visit Diagnosis: Difficulty in walking, not elsewhere classified (R26.2);Muscle weakness (generalized) (M62.81) Pain - Right/Left: Right Pain - part of body: Hip     Time: [id number] PT Time Calculation (min) (ACUTE ONLY): 26 min  Charges:  $Gait Training: 8-22 mins $Therapeutic Activity: 23-37 mins                    Rica Koyanagi  PTA [place] Office M-F          [phone] Weekend pager [phone]

## 2023-02-28 NOTE — Plan of Care (Signed)
  Problem: Clinical Measurements: Goal: Ability to maintain clinical measurements within normal limits will improve Outcome: Progressing   Problem: Pain Managment: Goal: General experience of comfort will improve Outcome: Progressing   Problem: Safety: Goal: Ability to remain free from injury will improve Outcome: Progressing   

## 2023-03-01 DIAGNOSIS — Z96649 Presence of unspecified artificial hip joint: Secondary | ICD-10-CM | POA: Diagnosis not present

## 2023-03-01 DIAGNOSIS — M978XXA Periprosthetic fracture around other internal prosthetic joint, initial encounter: Secondary | ICD-10-CM | POA: Diagnosis not present

## 2023-03-01 MED ORDER — POLYVINYL ALCOHOL 1.4 % OP SOLN: 1.0000 [drp] | OPHTHALMIC | Status: DC | PRN

## 2023-03-01 MED ORDER — MELATONIN 3 MG PO TABS
3.0000 mg | ORAL_TABLET | Freq: Once | ORAL | Status: AC
  Administered 2023-03-01: 3 mg via ORAL
  Filled 2023-03-01: qty 1

## 2023-03-01 NOTE — Plan of Care (Signed)
  Problem: Activity: Goal: Risk for activity intolerance will decrease Outcome: Progressing   Problem: Pain Managment: Goal: General experience of comfort will improve Outcome: Progressing   Problem: Safety: Goal: Ability to remain free from injury will improve Outcome: Progressing   

## 2023-03-01 NOTE — TOC Progression Note (Signed)
Transition of Care [facility]) - Progression Note    Patient Details  Name: Laura Hancock MRN: [id number] Date of Birth: 08-20-1924  Transition of Care [facility]) CM/SW Contact  Lennart Pall, LCSW Phone Number: 03/01/2023, 2:15 PM  Clinical Narrative:     Insurance authorization is still pending at this time.  Have updated daughter, MD/ RN and [place].  Expected Discharge Plan: [place] Barriers to Discharge: Continued Medical Work up  Expected Discharge Plan and Services                                               Social Determinants of Health (SDOH) Interventions SDOH Screenings   Food Insecurity: No Food Insecurity (02/24/2023)  Housing: Low Risk  (02/24/2023)  Transportation Needs: No Transportation Needs (02/24/2023)  Utilities: Not At Risk (02/24/2023)  Tobacco Use: Low Risk  (02/24/2023)    Readmission Risk Interventions     No data to display

## 2023-03-01 NOTE — Progress Notes (Signed)
  Progress Note   Patient: Laura Hancock [id number] DOB: 10/10/1924 DOA: 02/24/2023     5 DOS: the patient was seen and examined on 03/01/2023        Brief hospital course: Laura Hancock is a 89 y.o. F with CKD IIIa, HLD and carotid stenosis who presented with right hip pain after a fall.  In the ER, imaging showed a right periprosthetic fracture.  Case discussed with Orthopedics who felt that the type of fracture was best managed nonoperatively with 50% WBAT.  Incidentally noted to have LLL infiltrate, started on antibiotics.     Assessment and Plan: * 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, initial encounter - WBAT 50% weight bearing - No active abduction - Follow up with Orthopedics in 2 weeks    LLL pneumonia Completed 5 days antibiotics.             Subjective: left hip pain better. No fever, no confusion, no nursing concerns.    Physical Exam: BP 120/62 (BP Location: Right Arm)   Pulse 66   Temp 98.1 F (36.7 C) (Oral)   Resp 16   Ht 5\' 2"  (1.575 m)   Wt 48.7 kg   SpO2 100%   BMI 19.64 kg/m   General: Pt is alert, awake, not in acute distress Cardiovascular: RRR, nl S1-S2, no murmurs appreciated.   No LE edema.   Respiratory: Normal respiratory rate and rhythm.  CTAB without rales or wheezes. Abdominal: Abdomen soft and non-tender.  No distension or HSM.   Neuro/Psych: Strength symmetric in upper and lower extremities.  Judgment and insight appear normal.      Disposition: Status is: Inpatient To the best of my knowledge, the above diagnoses best describe the patient's illness, and all expected diagnostic testing that requires acute inpatient care has been completed.  At this point, the patient is medically ready to transition to an outpatient treatment plan, but because of her age, hip fracture, weight bearing limitations, and pain, it is my medical opinion that discharge home at this time would pose a high risk  unnecessary harm and she will require short term rehab.  Safe disposition is being sought, and the patient will be ready to transition to that setting as soon as arranged.         Author: Edwin Dada, MD 03/01/2023 2:45 PM  For on call review [email or web address].

## 2023-03-01 NOTE — Progress Notes (Signed)
Occupational Therapy Treatment Patient Details Name: Laura Hancock MRN: [id number] DOB: 22-Nov-1924 Today's Date: 03/01/2023   History of present illness Pt is a 89 y/o F admitted on 02/24/23 after presenting with a fall at her [facility]. X-ray showed periprosthetic femur fx around prior R THA. Orthopedics was consulted & pt is 50% weight bearing with no active abduction on RLE x 6 weeks. Pt also found to have incidental PNA on chest x-ray. PMH: DDD, HLD, carotid stenosis, insomnia, CKD 3A   OT comments  Patient is making progress towards goals. Patient was educated on using AE for Lb dressing tasks. Daughter present in room during session reporting she would look into getting patient a total hip kit. Patient was able to don/doff socks with min cues and increased time. Patient noted to be anxious with new tasks and movements that increased pain. Nurse made aware. Patient's discharge plan remains appropriate at this time. OT will continue to follow acutely.     Recommendations for follow up therapy are one component of a multi-disciplinary discharge planning process, led by the attending physician.  Recommendations may be updated based on patient status, additional functional criteria and insurance authorization.    Follow Up Recommendations  Skilled nursing-short term rehab (<3 hours/day)     Assistance Recommended at Discharge Frequent or constant Supervision/Assistance  Patient can return home with the following  A lot of help with bathing/dressing/bathroom;Assistance with cooking/housework;Direct supervision/assist for medications management;Assist for transportation;Help with stairs or ramp for entrance;Direct supervision/assist for financial management;A lot of help with walking and/or transfers   Equipment Recommendations  None recommended by OT       Precautions / Restrictions Precautions Precautions: Fall Precaution Comments: HOH Restrictions Weight Bearing Restrictions: Yes RLE Weight  Bearing: Partial weight bearing RLE Partial Weight Bearing Percentage or Pounds: 50 Other Position/Activity Restrictions: no active hip ABduction       Mobility Bed Mobility               General bed mobility comments: OOB in recliner                 Balance Overall balance assessment: Needs assistance Sitting-balance support: Feet supported, Bilateral upper extremity supported Sitting balance-Leahy Scale: Fair     Standing balance support: Reliant on assistive device for balance, Bilateral upper extremity supported Standing balance-Leahy Scale: Poor         ADL either performed or assessed with clinical judgement   ADL Overall ADL's : Needs assistance/impaired       Toilet Transfer: Minimal assistance;Ambulation;Rolling walker (2 wheels) Toilet Transfer Details (indicate cue type and reason): cues to use RW to maintain WB restrictions. Toileting- Clothing Manipulation and Hygiene: Minimal assistance;Sitting/lateral lean Toileting - Clothing Manipulation Details (indicate cue type and reason): on BSC       General ADL Comments: patient was educated on how to use AE for LB Dressing to don/doff socks with patietn noted to be anxious with each new direction. patietn was educated on taking each task one step at a time and trusting the process. patient verbalized understanding. patient reported feeling like a experiment with nursing students in room today who did not formally introduce themselves. concerns were heard and validated. concerns brought to attention of charge nurse at that time.      Cognition Arousal/Alertness: Awake/alert Behavior During Therapy: WFL for tasks assessed/performed Overall Cognitive Status: Within Functional Limits for tasks assessed       General Comments: HOH but cooperative, daughter in room  Pertinent Vitals/ Pain       Pain Assessment Pain Assessment: Faces Faces Pain Scale: Hurts little more Pain  Location: R LE with activity Pain Descriptors / Indicators: Discomfort, Sore         Frequency  Min 2X/week        Progress Toward Goals  OT Goals(current goals can now be found in the care plan section)  Progress towards OT goals: Progressing toward goals     Plan Discharge plan remains appropriate       AM-PAC OT "6 Clicks" Daily Activity     Outcome Measure   Help from another person eating meals?: None Help from another person taking care of personal grooming?: A Little Help from another person toileting, which includes using toliet, bedpan, or urinal?: A Lot Help from another person bathing (including washing, rinsing, drying)?: A Lot Help from another person to put on and taking off regular upper body clothing?: A Little Help from another person to put on and taking off regular lower body clothing?: A Lot 6 Click Score: 16    End of Session Equipment Utilized During Treatment: Gait belt;Rolling walker (2 wheels)  OT Visit Diagnosis: Unsteadiness on feet (R26.81);Other abnormalities of gait and mobility (R26.89);Muscle weakness (generalized) (M62.81);History of falling (Z91.81);Pain   Activity Tolerance Patient limited by pain   Patient Left in chair;with call bell/phone within reach;with chair alarm set   Nurse Communication Patient requests pain meds        Time: 1330-1410 OT Time Calculation (min): 40 min  Charges: OT General Charges $OT Visit: 1 Visit OT Treatments $Self Care/Home Management : 38-52 mins  Rennie Plowman, MS Acute Rehabilitation Department Office# [phone]   Willa Rough 03/01/2023, 3:49 PM

## 2023-03-02 DIAGNOSIS — Z96649 Presence of unspecified artificial hip joint: Secondary | ICD-10-CM | POA: Diagnosis not present

## 2023-03-02 DIAGNOSIS — M978XXA Periprosthetic fracture around other internal prosthetic joint, initial encounter: Secondary | ICD-10-CM | POA: Diagnosis not present

## 2023-03-02 NOTE — Progress Notes (Signed)
Occupational Therapy Treatment Patient Details Name: Laura Hancock MRN: [id number] DOB: 28-Nov-1924 Today's Date: 03/02/2023   History of present illness Pt is a 89 y/o F admitted on 02/24/23 after presenting with a fall at her [facility]F. X-ray showed periprosthetic femur fx around prior R THA. Orthopedics was consulted & pt is 50% weight bearing with no active abduction on RLE x 6 weeks. Pt also found to have incidental PNA on chest x-ray. PMH: DDD, HLD, carotid stenosis, insomnia, CKD 3A   OT comments  Patient with fair progress toward patient focused goals.  Patient requesting back to bed.  Bulk of treatment focused on hip precautions and transfer technique to maintain 50% WB to R lower extremity.  Patient with Min A for sit to stand and Min A for stand pivot transfer back to bed.  Patient also needing RW assist to ensure RW was not too close to her.  OT can continue efforts in the acute setting to address deficits, with SNF continuing to be recommended for post acute rehab to maximize her functional status prior to returning home.     Recommendations for follow up therapy are one component of a multi-disciplinary discharge planning process, led by the attending physician.  Recommendations may be updated based on patient status, additional functional criteria and insurance authorization.    Follow Up Recommendations  Skilled nursing-short term rehab (<3 hours/day)     Assistance Recommended at Discharge Frequent or constant Supervision/Assistance  Patient can return home with the following  A lot of help with bathing/dressing/bathroom;Assistance with cooking/housework;Direct supervision/assist for medications management;Assist for transportation;Help with stairs or ramp for entrance;Direct supervision/assist for financial management;A lot of help with walking and/or transfers   Equipment Recommendations  None recommended by OT    Recommendations for Other Services      Precautions /  Restrictions Precautions Precautions: Fall Restrictions Weight Bearing Restrictions: Yes RLE Weight Bearing: Partial weight bearing RLE Partial Weight Bearing Percentage or Pounds: 50% Other Position/Activity Restrictions: no active hip ABduction       Mobility Bed Mobility Overal bed mobility: Needs Assistance Bed Mobility: Sit to Supine       Sit to supine: Min assist   General bed mobility comments: increased time and cues for no active hip ABd    Transfers Overall transfer level: Needs assistance Equipment used: Rolling walker (2 wheels) Transfers: Sit to/from Stand, Bed to chair/wheelchair/BSC Sit to Stand: Min guard, Min assist     Step pivot transfers: Min assist     General transfer comment: demo and VC's for technique to limit WB to RLE.     Balance Overall balance assessment: Needs assistance Sitting-balance support: Feet supported Sitting balance-Laura Hancock: Good     Standing balance support: Reliant on assistive device for balance Standing balance-Laura Hancock: Poor                               Extremity/Trunk Assessment Upper Extremity Assessment Upper Extremity Assessment: Overall WFL for tasks assessed       Cervical / Trunk Assessment Cervical / Trunk Assessment: Kyphotic                      Cognition Arousal/Alertness: Awake/alert Behavior During Therapy: WFL for tasks assessed/performed Overall Cognitive Status: Within Functional Limits for tasks assessed  General Comments: STM deficits                     General Comments  VSS on RA    Pertinent Vitals/ Pain       Pain Assessment Pain Assessment: Faces Faces Pain Hancock: Hurts little more Pain Location: R LE with activity Pain Descriptors / Indicators: Discomfort, Sore Pain Intervention(s): Monitored during session                                                           Frequency  Min 2X/week        Progress Toward Goals  OT Goals(current goals can now be found in the care plan section)  Progress towards OT goals: Progressing toward goals  Acute Rehab OT Goals OT Goal Formulation: With patient Time For Goal Achievement: 03/11/23 Potential to Achieve Goals: [name] Discharge plan remains appropriate    Co-evaluation                 AM-PAC OT "6 Clicks" Daily Activity     Outcome Measure   Help from another person eating meals?: None Help from another person taking care of personal grooming?: None Help from another person toileting, which includes using toliet, bedpan, or urinal?: A Lot Help from another person bathing (including washing, rinsing, drying)?: A Lot Help from another person to put on and taking off regular upper body clothing?: A Little Help from another person to put on and taking off regular lower body clothing?: A Lot 6 Click Score: 17    End of Session Equipment Utilized During Treatment: Rolling walker (2 wheels)  OT Visit Diagnosis: Unsteadiness on feet (R26.81);Other abnormalities of gait and mobility (R26.89);Muscle weakness (generalized) (M62.81);History of falling (Z91.81);Pain Pain - Right/Left: Right Pain - part of body: Leg;Hip   Activity Tolerance Patient tolerated treatment well   Patient Left in bed;with call bell/phone within reach;with bed alarm set   Nurse Communication Mobility status        Time: [id number] OT Time Calculation (min): 21 min  Charges: OT General Charges $OT Visit: 1 Visit OT Treatments $Therapeutic Activity: 8-22 mins  03/02/2023  RP, OTR/L  Acute Rehabilitation Services  Office:  [phone]   Metta Clines 03/02/2023, 4:40 PM

## 2023-03-02 NOTE — Progress Notes (Signed)
Physical Therapy Treatment Patient Details Name: Laura Hancock MRN: [id number] DOB: 04-26-1924 Today's Date: 03/02/2023   History of Present Illness Pt is a 89 y/o F admitted on 02/24/23 after presenting with a fall at her ILF. X-ray showed periprosthetic femur fx around prior R THA. Orthopedics was consulted & pt is 50% weight bearing with no active abduction on RLE x 6 weeks. Pt also found to have incidental PNA on chest x-ray. PMH: DDD, HLD, carotid stenosis, insomnia, CKD 3A    PT Comments    Pt AxO x 3 VERY sweet.  VERY HOH.  Following all commands. Assisted OOB to [facility] first due to urinary urgency.  Assisted with peri care as pt was unable impaired balance.  Assisted with amb in hallway a limited distance.  General Gait Details: 75% VC's on PWB, proper walker to self distance and 75% VC's safety with turns.  Positioned in recliner to comfort.   Pt will need ST Rehab at SNF to address mobility and functional decline prior to safely returning home.   Recommendations for follow up therapy are one component of a multi-disciplinary discharge planning process, led by the attending physician.  Recommendations may be updated based on patient status, additional functional criteria and insurance authorization.  Follow Up Recommendations  Skilled nursing-short term rehab (<3 hours/day) Can patient physically be transported by private vehicle: No   Assistance Recommended at Discharge Intermittent Supervision/Assistance  Patient can return home with the following A little help with walking and/or transfers;A little help with bathing/dressing/bathroom;Assistance with cooking/housework;Assist for transportation;Help with stairs or ramp for entrance   Equipment Recommendations  None recommended by PT    Recommendations for Other Services       Precautions / Restrictions Precautions Precautions: Fall Restrictions Weight Bearing Restrictions: Yes RLE Weight Bearing: Partial weight bearing RLE  Partial Weight Bearing Percentage or Pounds: 50% Other Position/Activity Restrictions: no active hip ABduction     Mobility  Bed Mobility Overal bed mobility: Needs Assistance Bed Mobility: Supine to Sit     Supine to sit: HOB elevated, Min assist     General bed mobility comments: increased time    Transfers Overall transfer level: Needs assistance Equipment used: Rolling walker (2 wheels) Transfers: Sit to/from Stand Sit to Stand: Min assist, Mod assist   Step pivot transfers: Mod assist       General transfer comment: 50%cues for UE and LE positioning, assist to rise and steady.  Also assisted on/off BSC.    Ambulation/Gait Ambulation/Gait assistance: Min assist Gait Distance (Feet): 18 Feet Assistive device: Rolling walker (2 wheels) Gait Pattern/deviations: Step-to pattern, Decreased stance time - right, Antalgic Gait velocity: decreased     General Gait Details: 75% VC's on PWB, proper walker to self distance and 75% VC's safety with turns.   Stairs             Wheelchair Mobility    Modified Rankin (Stroke Patients Only)       Balance                                            Cognition Arousal/Alertness: Awake/alert Behavior During Therapy: WFL for tasks assessed/performed Overall Cognitive Status: Within Functional Limits for tasks assessed  General Comments: HOH but cooperative,        Exercises      General Comments        Pertinent Vitals/Pain Pain Assessment Pain Assessment: Faces Faces Pain Scale: Hurts a little bit Pain Location: R LE with activity Pain Descriptors / Indicators: Discomfort, Sore, Operative site guarding Pain Intervention(s): Monitored during session, Premedicated before session, Repositioned, Ice applied    Home Living                          Prior Function            PT Goals (current goals can now be found in the care  plan section) Progress towards PT goals: Progressing toward goals    Frequency    Min 3X/week      PT Plan Current plan remains appropriate    Co-evaluation              AM-PAC PT "6 Clicks" Mobility   Outcome Measure  Help needed turning from your back to your side while in a flat bed without using bedrails?: A Little Help needed moving from lying on your back to sitting on the side of a flat bed without using bedrails?: A Little Help needed moving to and from a bed to a chair (including a wheelchair)?: A Little Help needed standing up from a chair using your arms (e.g., wheelchair or bedside chair)?: A Little Help needed to walk in hospital room?: A Lot Help needed climbing 3-5 steps with a railing? : A Lot 6 Click Score: 16    End of Session Equipment Utilized During Treatment: Gait belt Activity Tolerance: Patient tolerated treatment well Patient left: in chair;with call bell/phone within reach;with chair alarm set Nurse Communication: Mobility status PT Visit Diagnosis: Difficulty in walking, not elsewhere classified (R26.2);Muscle weakness (generalized) (M62.81) Pain - Right/Left: Right Pain - part of body: Hip     Time: [id number] PT Time Calculation (min) (ACUTE ONLY): 23 min  Charges:  $Gait Training: 8-22 mins $Therapeutic Activity: 8-22 mins                     {Tagg Eustice  PTA Acute  [employer] M-F          [phone] Weekend pager [phone]

## 2023-03-02 NOTE — Progress Notes (Signed)
  Progress Note   Patient: Laura Hancock [id number] DOB: 1924-02-05 DOA: 02/24/2023     6 DOS: the patient was seen and examined on 03/02/2023        Brief hospital course: Mrs. Kilmer is a 89 y.o. F with CKD IIIa, HLD and carotid stenosis who presented with right periprosthetic hip fracutre.    Assessment and Plan: * 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, initial encounter - WBAT 50% weight bearing - No active abduction - Follow up with Orthopedics in 2 weeks    LLL pneumonia Completed 5 days antibiotics.             Subjective: No change.    Physical Exam: BP (!) 102/55 (BP Location: Right Arm)   Pulse 71   Temp 97.9 F (36.6 C) (Oral)   Resp 16   Ht 5\' 2"  (1.575 m)   Wt 48.7 kg   SpO2 99%   BMI 19.64 kg/m   No change General: Pt is alert, awake, not in acute distress Neuro/Psych: Strength symmetric in upper and lower extremities.  Judgment and insight appear normal.      Disposition: Status is: Inpatient Delayed discharge due to insurance authorization.        Author: Edwin Dada, MD 03/02/2023 4:07 PM  For on call review [email or web address].

## 2023-03-02 NOTE — TOC Progression Note (Signed)
Transition of Care [facility]) - Progression Note    Patient Details  Name: Laura Hancock MRN: [id number] Date of Birth: [birth year]  [facility]) CM/SW Contact  Lennart Pall, LCSW Phone Number: 03/02/2023, 3:44 PM  Clinical Narrative:     Have received insurance authorization.  Approved 3/21 - 4/2. Cert: [id number]. Pt, daughter, MD and facility all aware and planning for dc tomorrow to [place] [facility].   Expected Discharge Plan: [place] Barriers to Discharge: Continued Medical Work up  Expected Discharge Plan and Services                                               Social Determinants of Health (SDOH) Interventions SDOH Screenings   Food Insecurity: No Food Insecurity (02/24/2023)  Housing: Low Risk  (02/24/2023)  Transportation Needs: No Transportation Needs (02/24/2023)  Utilities: Not At Risk (02/24/2023)  Tobacco Use: Low Risk  (02/24/2023)    Readmission Risk Interventions     No data to display

## 2023-03-02 NOTE — Plan of Care (Signed)
?  Problem: Activity: ?Goal: Risk for activity intolerance will decrease ?Outcome: Progressing ?  ?Problem: Safety: ?Goal: Ability to remain free from injury will improve ?Outcome: Progressing ?  ?Problem: Pain Managment: ?Goal: General experience of comfort will improve ?Outcome: Progressing ?  ?

## 2023-03-03 DIAGNOSIS — M2041 Other hammer toe(s) (acquired), right foot: Secondary | ICD-10-CM | POA: Diagnosis not present

## 2023-03-03 DIAGNOSIS — R2689 Other abnormalities of gait and mobility: Secondary | ICD-10-CM | POA: Diagnosis not present

## 2023-03-03 DIAGNOSIS — M9701XD Periprosthetic fracture around internal prosthetic right hip joint, subsequent encounter: Secondary | ICD-10-CM | POA: Diagnosis not present

## 2023-03-03 DIAGNOSIS — I959 Hypotension, unspecified: Secondary | ICD-10-CM | POA: Diagnosis not present

## 2023-03-03 DIAGNOSIS — M978XXS Periprosthetic fracture around other internal prosthetic joint, sequela: Secondary | ICD-10-CM | POA: Diagnosis not present

## 2023-03-03 DIAGNOSIS — G4700 Insomnia, unspecified: Secondary | ICD-10-CM | POA: Diagnosis not present

## 2023-03-03 DIAGNOSIS — I6529 Occlusion and stenosis of unspecified carotid artery: Secondary | ICD-10-CM | POA: Diagnosis not present

## 2023-03-03 DIAGNOSIS — Z96649 Presence of unspecified artificial hip joint: Secondary | ICD-10-CM | POA: Diagnosis not present

## 2023-03-03 DIAGNOSIS — L602 Onychogryphosis: Secondary | ICD-10-CM | POA: Diagnosis not present

## 2023-03-03 DIAGNOSIS — E785 Hyperlipidemia, unspecified: Secondary | ICD-10-CM | POA: Diagnosis not present

## 2023-03-03 DIAGNOSIS — R41841 Cognitive communication deficit: Secondary | ICD-10-CM | POA: Diagnosis not present

## 2023-03-03 DIAGNOSIS — M9701XA Periprosthetic fracture around internal prosthetic right hip joint, initial encounter: Secondary | ICD-10-CM | POA: Diagnosis not present

## 2023-03-03 DIAGNOSIS — N1831 Chronic kidney disease, stage 3a: Secondary | ICD-10-CM | POA: Diagnosis not present

## 2023-03-03 DIAGNOSIS — M6281 Muscle weakness (generalized): Secondary | ICD-10-CM | POA: Diagnosis not present

## 2023-03-03 DIAGNOSIS — R300 Dysuria: Secondary | ICD-10-CM | POA: Diagnosis not present

## 2023-03-03 DIAGNOSIS — M79671 Pain in right foot: Secondary | ICD-10-CM | POA: Diagnosis not present

## 2023-03-03 DIAGNOSIS — M5136 Other intervertebral disc degeneration, lumbar region: Secondary | ICD-10-CM | POA: Diagnosis not present

## 2023-03-03 DIAGNOSIS — R4182 Altered mental status, unspecified: Secondary | ICD-10-CM | POA: Diagnosis not present

## 2023-03-03 DIAGNOSIS — F32A Depression, unspecified: Secondary | ICD-10-CM | POA: Diagnosis not present

## 2023-03-03 DIAGNOSIS — R262 Difficulty in walking, not elsewhere classified: Secondary | ICD-10-CM | POA: Diagnosis not present

## 2023-03-03 DIAGNOSIS — M2042 Other hammer toe(s) (acquired), left foot: Secondary | ICD-10-CM | POA: Diagnosis not present

## 2023-03-03 DIAGNOSIS — M79672 Pain in left foot: Secondary | ICD-10-CM | POA: Diagnosis not present

## 2023-03-03 DIAGNOSIS — M978XXA Periprosthetic fracture around other internal prosthetic joint, initial encounter: Secondary | ICD-10-CM | POA: Diagnosis not present

## 2023-03-03 DIAGNOSIS — J189 Pneumonia, unspecified organism: Secondary | ICD-10-CM | POA: Diagnosis not present

## 2023-03-03 DIAGNOSIS — I739 Peripheral vascular disease, unspecified: Secondary | ICD-10-CM | POA: Diagnosis not present

## 2023-03-03 DIAGNOSIS — R29898 Other symptoms and signs involving the musculoskeletal system: Secondary | ICD-10-CM | POA: Diagnosis not present

## 2023-03-03 DIAGNOSIS — D649 Anemia, unspecified: Secondary | ICD-10-CM | POA: Diagnosis not present

## 2023-03-03 DIAGNOSIS — Z7401 Bed confinement status: Secondary | ICD-10-CM | POA: Diagnosis not present

## 2023-03-03 DIAGNOSIS — H409 Unspecified glaucoma: Secondary | ICD-10-CM | POA: Diagnosis not present

## 2023-03-03 MED ORDER — SENNA 8.6 MG PO TABS: 1.0000 | ORAL_TABLET | Freq: Every day | ORAL | 0 refills | Status: DC | PRN

## 2023-03-03 MED ORDER — HYDROCODONE-ACETAMINOPHEN 5-325 MG PO TABS: 1.0000 | ORAL_TABLET | ORAL | 0 refills | Status: DC | PRN

## 2023-03-03 MED ORDER — ACETAMINOPHEN 325 MG PO TABS: 650.0000 mg | ORAL_TABLET | Freq: Four times a day (QID) | ORAL | Status: DC | PRN

## 2023-03-03 MED ORDER — ONDANSETRON HCL 4 MG PO TABS: 4.0000 mg | ORAL_TABLET | Freq: Four times a day (QID) | ORAL | 0 refills | Status: AC | PRN

## 2023-03-03 NOTE — Progress Notes (Signed)
Attempted to call report to [facility] [address]. Call went to voicemail. Name and call back number was given.

## 2023-03-03 NOTE — TOC Transition Note (Signed)
Transition of Care [facility]) - CM/SW Discharge Note   Patient Details  Name: Laura Hancock MRN: [id number] Date of Birth: August 07, 1924  Transition of Care [facility]) CM/SW Contact:  Lennart Pall, LCSW Phone Number: 03/03/2023, 10:48 AM   Clinical Narrative:     Pt medically ready for dc today to [place] [facility].  Pt and daughter aware and agreeable.  PTAR called at 10:45am.  RN to call report to [phone].  No further TOC needs.  Final next level of care: [place] Barriers to Discharge: Barriers Resolved   Patient Goals and CMS Choice      Discharge Placement                         Discharge Plan and Services Additional resources added to the After Visit Summary for                  DME Arranged: N/A DME Agency: NA                  Social Determinants of Health (SDOH) Interventions SDOH Screenings   Food Insecurity: No Food Insecurity (02/24/2023)  Housing: [place]  (02/24/2023)  Transportation Needs: No Transportation Needs (02/24/2023)  Utilities: Not At Risk (02/24/2023)  Tobacco Use: Low Risk  (02/24/2023)     Readmission Risk Interventions     No data to display

## 2023-03-03 NOTE — [place] FL2 (Signed)
[place] MEDICAID FL2 LEVEL OF CARE FORM     IDENTIFICATION  Patient Name: Laura Hancock Birthdate: 1924-07-26 Sex: female Admission Date (Current Location): 02/24/2023  [facility] and [place] Number:  Herbalist and Address:  [facility],  [place] [address], [place]      Provider Number: [id number]  Attending Physician Name and Address:  No att. providers found  Relative Name and Phone Number:   Stasi Landmark ([phone])    Current Level of Care: Hospital Recommended Level of Care: [place] Prior Approval Number:    Date Approved/Denied:   PASRR Number:  ([id number])  Discharge Plan: SNF    Current Diagnoses: Patient Active Problem List   Diagnosis Date Noted   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, initial encounter 02/24/2023   DNR (do not resuscitate)/DNI(Do Not Intubate) 02/24/2023   LLL pneumonia 02/24/2023   Insomnia 02/24/2023    Orientation RESPIRATION BLADDER Height & Weight     Self, Time, Situation, Place  Normal Continent Weight: 104 lb 8 oz (47.4 kg) Height:  5\' 2"  (157.5 cm)  BEHAVIORAL SYMPTOMS/MOOD NEUROLOGICAL BOWEL NUTRITION STATUS      Continent Diet (Regular)  AMBULATORY STATUS COMMUNICATION OF NEEDS Skin   Limited Assist Verbally Normal                       Personal Care Assistance Level of Assistance  Bathing, Feeding, Dressing Bathing Assistance: Limited assistance Feeding assistance: Limited assistance Dressing Assistance: Limited assistance     Functional Limitations Info  Sight, Hearing, Speech Sight Info: Adequate Hearing Info: Adequate Speech Info: Adequate    SPECIAL CARE FACTORS FREQUENCY   (50% WB RLE w/walker)     PT Frequency:  (5x week) OT Frequency:  (5x week)            Contractures Contractures Info: Not present    Additional Factors Info  Code Status, Allergies Code Status Info:  (DNR) Allergies Info:  (NKA)            Current Medications (03/03/2023):  This is the current hospital active medication list Current Facility-Administered Medications  Medication Dose Route Frequency Provider Last Rate Last Admin   acetaminophen (TYLENOL) tablet 650 mg  650 mg Oral Q6H PRN Kristopher Oppenheim, DO       Or   acetaminophen (TYLENOL) suppository 650 mg  650 mg Rectal Q6H PRN Kristopher Oppenheim, DO       citalopram (CELEXA) tablet 10 mg  10 mg Oral Daily Debbe Odea, MD   10 mg at 03/03/23 1005   HYDROcodone-acetaminophen (NORCO/VICODIN) 5-325 MG per tablet 1 tablet  1 tablet Oral Q4H PRN Kristopher Oppenheim, DO   1 tablet at 03/03/23 1006   latanoprost (XALATAN) 0.005 % ophthalmic solution 1 drop  1 drop Both Eyes QHS [place], Kathe Becton, MD   1 drop at 03/02/23 2106   lip balm (CARMEX) ointment 1 Application  1 Application Topical PRN Edwin Dada, MD       ondansetron (ZOFRAN) tablet 4 mg  4 mg Oral Q6H PRN Kristopher Oppenheim, DO       Or   ondansetron [facility]) injection 4 mg  4 mg Intravenous Q6H PRN Kristopher Oppenheim, DO       Oral care mouth rinse  15 mL Mouth Rinse PRN Danford, Suann Larry, MD       polyvinyl alcohol (LIQUIFILM TEARS) 1.4 % ophthalmic solution 1 drop  1  drop Both Eyes PRN Danford, Suann Larry, MD       senna (SENOKOT) tablet 8.6 mg  1 tablet Oral Daily Rizwan, Eunice Blase, MD   8.6 mg at 03/03/23 1005   zolpidem (AMBIEN) tablet 5 mg  5 mg Oral QHS PRN Kristopher Oppenheim, DO   5 mg at 03/02/23 2104   zolpidem (AMBIEN) tablet 5 mg  5 mg Oral Once Kristopher Oppenheim, DO       Current Outpatient Medications  Medication Sig Dispense Refill   AZO CRANBERRY GUMMIES PO Take by mouth. 2 each day     calcium carbonate (OSCAL) 1500 (600 Ca) MG TABS tablet Take 1,500 mg by mouth daily at 6 (six) AM.     citalopram (CELEXA) 10 MG tablet Take 10 mg by mouth daily.     latanoprost (XALATAN) 0.005 % ophthalmic solution Place 1 drop into both eyes at bedtime.     Multiple Vitamin (MULTIVITAMIN) tablet Take 1 tablet by mouth daily.     naproxen  (NAPROSYN) 500 MG tablet Take 500 mg by mouth as needed.     traZODone (DESYREL) 50 MG tablet Take 25 mg by mouth at bedtime as needed.     zolpidem (AMBIEN) 5 MG tablet Take 5 mg by mouth daily.     acetaminophen (TYLENOL) 325 MG tablet Take 2 tablets (650 mg total) by mouth every 6 (six) hours as needed for mild pain (or Fever >/= 101).     HYDROcodone-acetaminophen (NORCO/VICODIN) 5-325 MG tablet Take 1 tablet by mouth every 4 (four) hours as needed for moderate pain. 8 tablet 0   ondansetron (ZOFRAN) 4 MG tablet Take 1 tablet (4 mg total) by mouth every 6 (six) hours as needed for nausea. 20 tablet 0   senna (SENOKOT) 8.6 MG TABS tablet Take 1 tablet (8.6 mg total) by mouth daily as needed for mild constipation. 120 tablet 0     Discharge Medications: Please see discharge summary for a list of discharge medications.  Relevant Imaging Results:  Relevant Lab Results:   Additional Information  (ss#[phone])  Rilei Kravitz, LCSW

## 2023-03-03 NOTE — Plan of Care (Signed)

## 2023-03-03 NOTE — Discharge Summary (Signed)
Physician Discharge Summary   Patient: Laura Hancock MRN: [id number] DOB: [birth year]  Admit date:     02/24/2023  Discharge date: 03/03/23  Discharge Physician: Laura Hancock   PCP: Laura Dad, MD     Recommendations at discharge:  [place] SNF: Please arrange follow up with Laura Hancock or Laura Hancock at [facility] in 7-10 days May be 50% weight bearing on right leg     Discharge Diagnoses: Principal Problem:   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, initial encounter Active Problems:   Left lower lobe pneumonia   Insomnia   Chronic kidney disease stage IIIa   Hyperlipidemia   Peripheral vascular disease     Hospital Course: Laura Hancock is a 89 y.o. F with CKD IIIa, HLD and carotid stenosis who presented with right hip pain after a fall.  In the ER, imaging showed a right periprosthetic fracture.  Case discussed with Orthopedics who felt that the type of fracture was best managed nonoperatively with 50% WBAT.  Incidentally noted to have LLL infiltrate, completed 5 days antibiotics.             The [facility] Controlled Substances Registry was reviewed for this patient prior to discharge.   Consultants: Orthopedics Procedures performed: None  Disposition: Skilled nursing facility   DISCHARGE MEDICATION: Allergies as of 03/03/2023   No Known Allergies      Medication List     STOP taking these medications    diazepam 5 MG tablet Commonly known as: VALIUM       TAKE these medications    acetaminophen 325 MG tablet Commonly known as: TYLENOL Take 2 tablets (650 mg total) by mouth every 6 (six) hours as needed for mild pain (or Fever >/= 101).   AZO CRANBERRY GUMMIES PO Take by mouth. 2 each day   calcium carbonate 1500 (600 Ca) MG Tabs tablet Commonly known as: OSCAL Take 1,500 mg by mouth daily at 6 (six) AM.   citalopram 10 MG tablet Commonly known as: CELEXA Take 10 mg by mouth  daily.   HYDROcodone-acetaminophen 5-325 MG tablet Commonly known as: NORCO/VICODIN Take 1 tablet by mouth every 4 (four) hours as needed for moderate pain. What changed:  when to take this reasons to take this   latanoprost 0.005 % ophthalmic solution Commonly known as: XALATAN Place 1 drop into both eyes at bedtime.   multivitamin tablet Take 1 tablet by mouth daily.   naproxen 500 MG tablet Commonly known as: NAPROSYN Take 500 mg by mouth as needed.   ondansetron 4 MG tablet Commonly known as: ZOFRAN Take 1 tablet (4 mg total) by mouth every 6 (six) hours as needed for nausea.   senna 8.6 MG Tabs tablet Commonly known as: SENOKOT Take 1 tablet (8.6 mg total) by mouth daily as needed for mild constipation.   traZODone 50 MG tablet Commonly known as: DESYREL Take 25 mg by mouth at bedtime as needed.   zolpidem 5 MG tablet Commonly known as: AMBIEN Take 5 mg by mouth daily.        Contact information for follow-up providers     Laura Cancel, MD Follow up.   Specialty: Orthopedic Surgery Contact information: [address] [place] [address] [phone]              Contact information for after-discharge care     Destination     [facility] .   Service: Skilled Nursing  Contact information: Laura Hancock [phone]                     Discharge Instructions     Increase activity slowly   Complete by: As directed        Discharge Exam: Filed Weights   02/27/23 0500 03/01/23 0500 03/03/23 0509  Weight: 50.1 kg 48.7 kg 47.4 kg    General: Pt is alert, awake, not in acute distress Cardiovascular: RRR, nl S1-S2, no murmurs appreciated.   No LE edema.   Respiratory: Normal respiratory rate and rhythm.  CTAB without rales or wheezes. Abdominal: Abdomen soft and non-tender.  No distension or HSM.   Neuro/Psych: Strength symmetric in upper and lower  extremities.  Judgment and insight appear normal.   Condition at discharge: good  The results of significant diagnostics from this hospitalization (including imaging, microbiology, ancillary and laboratory) are listed below for reference.   Imaging Studies: CT Hip Right Wo Contrast  Result Date: 02/24/2023 CLINICAL DATA:  Hip trauma, fracture suspected, xray done EXAM: CT OF THE RIGHT HIP WITHOUT CONTRAST TECHNIQUE: Multidetector CT imaging of the right hip was performed according to the standard protocol. Multiplanar CT image reconstructions were also generated. RADIATION DOSE REDUCTION: This exam was performed according to the departmental dose-optimization program which includes automated exposure control, adjustment of the mA and/or kV according to patient size and/or use of iterative reconstruction technique. COMPARISON:  X-ray right hip 02/24/2023 FINDINGS: Bones/Joint/Cartilage Right hip total arthroplasty. Acute periprosthetic comminuted fracture of the proximal femoral shaft and intertrochanteric region. Vague cortical irregularity of the right inferior and superior pubic rami with no definite fractures. Normal alignment of the right hip surgical hardware. No aggressive appearing focal bone abnormality. Ligaments Suboptimally assessed by CT. Muscles and Tendons Grossly unremarkable. Soft tissues Mild subcutaneus soft tissue edema.  No large hematoma formation. Other: Colonic diverticulosis.  Atherosclerotic plaque. IMPRESSION: Acute periprosthetic comminuted fracture of the proximal femoral shaft and intertrochanteric region. Electronically Signed   By: Laura Hancock M.D.   On: 02/24/2023 03:12   DG Chest 1 View  Result Date: 02/24/2023 CLINICAL DATA:  190176 Fall 190176 EXAM: CHEST  1 VIEW COMPARISON:  Chest x-ray 09/24/2008 FINDINGS: The heart and mediastinal contours are unchanged. Aortic calcification. Biapical pleural/pulmonary scarring. Left lung patchy airspace and interstitial  opacities more prominent along the left lower lung zone. No pulmonary edema. No pleural effusion. No pneumothorax. No acute osseous abnormality. IMPRESSION: 1. Left lung patchy airspace and interstitial opacities more prominent along the left lower lung zone suggestive of infection/inflammation. Followup PA and lateral chest X-ray is recommended in 3-4 weeks following therapy to ensure resolution and exclude underlying malignancy. 2.  Emphysema (ICD10-J43.9). Electronically Signed   By: Laura Hancock M.D.   On: 02/24/2023 01:28   DG Knee Complete 4 Views Right  Result Date: 02/24/2023 CLINICAL DATA:  190176 Fall 190176 EXAM: RIGHT KNEE - COMPLETE 4+ VIEW COMPARISON:  None Available. FINDINGS: No evidence of fracture, dislocation, or joint effusion. Mild degenerative changes of the knee. Soft tissues are unremarkable. Vascular calcification. IMPRESSION: No acute displaced fracture or dislocation. Electronically Signed   By: Laura Hancock M.D.   On: 02/24/2023 01:26   DG Hip Unilat With Pelvis 2-3 Views Right  Result Date: 02/24/2023 CLINICAL DATA:  pain EXAM: DG HIP (WITH OR WITHOUT PELVIS) 2-3V RIGHT COMPARISON:  None Available. FINDINGS: Right hip total arthroplasty. Acute periprosthetic comminuted fracture of the proximal femoral cortex. Normal  alignment of the right hip surgical hardware. There is no evidence of hip fracture or dislocation of the left hip on frontal view. At least moderate degenerative changes of the left hip. No focal bone abnormality. No acute displaced fracture or diastasis of the bones of the pelvis. Degenerative changes of the visualized lower lumbar spine. IMPRESSION: Acute periprosthetic comminuted fracture of the proximal femoral cortex in a patient status post total right hip arthroplasty. Electronically Signed   By: Laura Hancock M.D.   On: 02/24/2023 01:26    Microbiology: Results for orders placed or performed during the hospital encounter of 02/24/23  Respiratory  (~20 pathogens) panel by PCR     Status: None   Collection Time: 02/25/23 11:00 AM   Specimen: Nasopharyngeal Swab; Respiratory  Result Value Ref Range Status   Adenovirus NOT DETECTED NOT DETECTED Final   Coronavirus 229E NOT DETECTED NOT DETECTED Final    Comment: (NOTE) The Coronavirus on the Respiratory Panel, DOES NOT test for the novel  Coronavirus (2019 nCoV)    Coronavirus HKU1 NOT DETECTED NOT DETECTED Final   Coronavirus NL63 NOT DETECTED NOT DETECTED Final   Coronavirus OC43 NOT DETECTED NOT DETECTED Final   Metapneumovirus NOT DETECTED NOT DETECTED Final   Rhinovirus / Enterovirus NOT DETECTED NOT DETECTED Final   Influenza A NOT DETECTED NOT DETECTED Final   Influenza B NOT DETECTED NOT DETECTED Final   Parainfluenza Virus 1 NOT DETECTED NOT DETECTED Final   Parainfluenza Virus 2 NOT DETECTED NOT DETECTED Final   Parainfluenza Virus 3 NOT DETECTED NOT DETECTED Final   Parainfluenza Virus 4 NOT DETECTED NOT DETECTED Final   Respiratory Syncytial Virus NOT DETECTED NOT DETECTED Final   Bordetella pertussis NOT DETECTED NOT DETECTED Final   Bordetella Parapertussis NOT DETECTED NOT DETECTED Final   Chlamydophila pneumoniae NOT DETECTED NOT DETECTED Final   Mycoplasma pneumoniae NOT DETECTED NOT DETECTED Final    Comment: Performed at [facility], [address] [place], [place] [address]  Resp panel by RT-PCR (RSV, Flu A&B, Covid) Anterior Nasal Swab     Status: None   Collection Time: 02/25/23 11:00 AM   Specimen: Anterior Nasal Swab  Result Value Ref Range Status   SARS Coronavirus 2 by RT PCR NEGATIVE NEGATIVE Final    Comment: (NOTE) SARS-CoV-2 target nucleic acids are NOT DETECTED.  The SARS-CoV-2 RNA is generally detectable in upper respiratory specimens during the acute phase of infection. The lowest concentration of SARS-CoV-2 viral copies this assay can detect is 138 copies/mL. A negative result does not preclude SARS-Cov-2 infection and should not be  used as the sole basis for treatment or other patient management decisions. A negative result may occur with  improper specimen collection/handling, submission of specimen other than nasopharyngeal swab, presence of viral mutation(s) within the areas targeted by this assay, and inadequate number of viral copies(<138 copies/mL). A negative result must be combined with clinical observations, patient history, and epidemiological information. The expected result is Negative.  Fact Sheet for Patients:  EntrepreneurPulse.com.au  Fact Sheet for Healthcare Providers:  IncredibleEmployment.be  This test is no t yet approved or cleared by the [place] FDA and  has been authorized for detection and/or diagnosis of SARS-CoV-2 by FDA under an Emergency Use Authorization (EUA). This EUA will remain  in effect (meaning this test can be used) for the duration of the COVID-19 declaration under Section 564(b)(1) of the Act, 21 U.S.C.section 360bbb-3(b)(1), unless the authorization is terminated  or revoked sooner.  Influenza A by PCR NEGATIVE NEGATIVE Final   Influenza B by PCR NEGATIVE NEGATIVE Final    Comment: (NOTE) The Xpert Xpress SARS-CoV-2/FLU/RSV plus assay is intended as an aid in the diagnosis of influenza from Nasopharyngeal swab specimens and should not be used as a sole basis for treatment. Nasal washings and aspirates are unacceptable for Xpert Xpress SARS-CoV-2/FLU/RSV testing.  Fact Sheet for Patients: EntrepreneurPulse.com.au  Fact Sheet for Healthcare Providers: IncredibleEmployment.be  This test is not yet approved or cleared by the [place] FDA and has been authorized for detection and/or diagnosis of SARS-CoV-2 by FDA under an Emergency Use Authorization (EUA). This EUA will remain in effect (meaning this test can be used) for the duration of the COVID-19 declaration under Section  564(b)(1) of the Act, 21 U.S.C. section 360bbb-3(b)(1), unless the authorization is terminated or revoked.     Resp Syncytial Virus by PCR NEGATIVE NEGATIVE Final    Comment: (NOTE) Fact Sheet for Patients: EntrepreneurPulse.com.au  Fact Sheet for Healthcare Providers: IncredibleEmployment.be  This test is not yet approved or cleared by the [place] FDA and has been authorized for detection and/or diagnosis of SARS-CoV-2 by FDA under an Emergency Use Authorization (EUA). This EUA will remain in effect (meaning this test can be used) for the duration of the COVID-19 declaration under Section 564(b)(1) of the Act, 21 U.S.C. section 360bbb-3(b)(1), unless the authorization is terminated or revoked.  Performed at [facility], [place] [address], [place], [place] [address]     Labs: CBC: No results for input(s): "WBC", "NEUTROABS", "HGB", "HCT", "MCV", "PLT" in the last 168 hours. Basic Metabolic Panel: No results for input(s): "NA", "K", "CL", "CO2", "GLUCOSE", "BUN", "CREATININE", "CALCIUM", "MG", "PHOS" in the last 168 hours. Liver Function Tests: No results for input(s): "AST", "ALT", "ALKPHOS", "BILITOT", "PROT", "ALBUMIN" in the last 168 hours. CBG: No results for input(s): "GLUCAP" in the last 168 hours.  Discharge time spent: approximately 25 minutes spent on discharge counseling, evaluation of patient on day of discharge, and coordination of discharge planning with nursing, social work, pharmacy and case management  Signed: Edwin Dada, MD [facility] 03/03/2023

## 2023-03-07 ENCOUNTER — Non-Acute Institutional Stay (SKILLED_NURSING_FACILITY): Payer: Medicare Other | Admitting: Family Medicine

## 2023-03-07 DIAGNOSIS — Z96649 Presence of unspecified artificial hip joint: Secondary | ICD-10-CM | POA: Diagnosis not present

## 2023-03-07 DIAGNOSIS — M978XXA Periprosthetic fracture around other internal prosthetic joint, initial encounter: Secondary | ICD-10-CM

## 2023-03-07 DIAGNOSIS — J189 Pneumonia, unspecified organism: Secondary | ICD-10-CM

## 2023-03-07 DIAGNOSIS — G4700 Insomnia, unspecified: Secondary | ICD-10-CM | POA: Diagnosis not present

## 2023-03-07 NOTE — Progress Notes (Signed)
Provider:  Alain Honey, MD Location:      Place of Service:     PCP: Virgie Dad, MD Patient Care Team: Virgie Dad, MD as PCP - General (Internal Medicine) Clydell Hakim, MD (Inactive) as Consulting Physician (Anesthesiology) Ceasar Mons, MD as Consulting Physician (Urology)  Extended Emergency Contact Information Primary Emergency Contact: Zhara, Wanger Mobile Phone: [phone] Relation: Daughter Secondary Emergency Contact: Karalee Height [place] Mobile Phone: [phone] Relation: Daughter  Code Status:  Goals of Care: Advanced Directive information    02/24/2023   10:00 AM  Advanced Directives  Does Patient Have a Medical Advance Directive? Yes  Type of Paramedic of [place];Living will  Does patient want to make changes to medical advance directive? No - Patient declined  Copy of [place] in Chart? No - copy requested      No chief complaint on file.   HPI: Patient is a 89 y.o. female seen today for admission to [facility].  This nice lady was admitted to the hospital on 02/24/2023 and discharged on 03/03/2023.  She was taken to the hospital after a fall in her apartment at [place] in which she is suffered pain in her right hip.  That hip had previously been replaced with a prosthesis.  X-rays of the hospital showed a comminuted fracture.  Orthopedics was consulted and it was felt that this was best handled nonoperatively and she is now admitted to this facility for physical therapy with hopes of getting her back to her apartment at [facility] post therapy. She has lived at friend's home for about 5 years.  She continues to drive and do water aerobics at the [facility].  She plays bridge at friend's home.  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  She was treated with a 5-day course of antibiotic.  Past Medical History:  Diagnosis Date   Arthritis    Back pain    Chronic kidney disease, stage 3a (HCC)    Insomnia    Leg pain    Neck pain    No past surgical history on file.  reports that she has never smoked. She has never used smokeless tobacco. She reports current alcohol use of about 14.0 standard drinks of alcohol per week. She reports that she does not use drugs. Social History   Socioeconomic History   Marital status: Widowed    Spouse name: Not on file   Number of children: Not on file   Years of education: Not on file   Highest education level: Not on file  Occupational History   Not on file  Tobacco Use   Smoking status: Never   Smokeless tobacco: Never  Vaping Use   Vaping Use: Never used  Substance and Sexual Activity   Alcohol use: Yes    Alcohol/week: 14.0 standard drinks of alcohol    Types: 14 Standard drinks or equivalent per week   Drug use: Never   Sexual activity: Not on file  Other Topics Concern   Not on file  Social History Narrative   Social History      Diet? light      Do you drink/eat things with caffeine? yes      Marital status?  Widowed                      What year were you married? 1946      Do you live in a house, apartment, assisted living, condo, trailer, etc.? Apartment retirement community       Is it one or more stories? yes      How many persons live in your home? 1      Do you have any pets in your home? (please list) no       Highest level of education completed? 2 years college       Current or past profession:      Do you exercise?         yes                             Type & how often? Water aerobics       Advanced Directives      Do you have a living will? yes      Do you have a DNR form?                  yes                If not, do you want to discuss one? yes      Do  you have signed POA/HPOA for forms? yes      Functional Status      Do you have difficulty bathing or dressing yourself? No       Do you have difficulty preparing food or eating? No       Do you have difficulty managing your medications? No       Do you have difficulty managing your finances? No       Do you have difficulty affording your medications? No       Social Determinants of Health   Financial Resource Strain: Not on file  Food Insecurity: No Food Insecurity (02/24/2023)   Hunger Vital Sign    Worried About Running Out of Food in the Last Year: Never true    Ran Out of Food in the Last Year: Never true  Transportation Needs: No Transportation Needs (02/24/2023)   PRAPARE - Hydrologist (Medical): No    Lack of Transportation (Non-Medical): No  Physical Activity: Not on file  Stress: Not on file  Social Connections: Not on file  Intimate Partner Violence: Not At Risk (02/24/2023)   Humiliation, Afraid, Rape, and Kick questionnaire    Fear of Current or Ex-Partner: No    Emotionally Abused: No    Physically Abused: No    Sexually Abused: No    Functional Status Survey:    Family History  Problem Relation Age of Onset   Cancer Father    Cancer Sister    Stroke Sister     Health Maintenance  Topic Date Due   DTaP/Tdap/Td (1 - Tdap) Never done   Zoster Vaccines- Shingrix (1 of 2) Never done   Pneumonia Vaccine 38+ Years old (1 of 1 - PCV) Never done   DEXA SCAN  Never done   INFLUENZA VACCINE  07/12/2022   COVID-19 Vaccine (3 - 2023-24 season) 08/12/2022   Medicare Annual Wellness (AWV)  11/11/2023   HPV VACCINES  Aged Out    No Known Allergies  Outpatient Encounter Medications as of 03/07/2023  Medication Sig   acetaminophen (TYLENOL) 325 MG tablet Take 2 tablets (650 mg total) by mouth every 6 (six) hours as needed for mild pain (or Fever >/= 101).   AZO CRANBERRY GUMMIES PO Take by mouth. 2 each day   calcium carbonate  (OSCAL) 1500 (600 Ca) MG TABS tablet Take 1,500 mg by mouth daily at 6 (six) AM.   citalopram (CELEXA) 10 MG tablet Take 10 mg by mouth daily.   HYDROcodone-acetaminophen (NORCO/VICODIN) 5-325 MG tablet Take 1 tablet by mouth every 4 (four) hours as needed for moderate pain.   latanoprost (XALATAN) 0.005 % ophthalmic solution Place 1 drop into both eyes at bedtime.   Multiple Vitamin (MULTIVITAMIN) tablet Take 1 tablet by mouth daily.   naproxen (NAPROSYN) 500 MG tablet Take 500 mg by mouth as needed.   ondansetron (ZOFRAN) 4 MG tablet Take 1 tablet (4 mg total) by mouth every 6 (six) hours as needed for nausea.   senna (SENOKOT) 8.6 MG TABS tablet Take 1 tablet (8.6 mg total) by mouth daily as needed for mild constipation.   traZODone (DESYREL) 50 MG tablet Take 25 mg by mouth at bedtime as needed.   zolpidem (AMBIEN) 5 MG tablet Take 5 mg by mouth daily.   No facility-administered encounter medications on file as of 03/07/2023.    Review of Systems  Constitutional: Negative.   HENT: Negative.    Respiratory: Negative.    Cardiovascular: Negative.   Gastrointestinal: Negative.   Genitourinary: Negative.   Musculoskeletal:  Positive for arthralgias and gait problem.  Hematological: Negative.   Psychiatric/Behavioral:  Positive for sleep disturbance.   All other systems reviewed and are negative.   There were no vitals filed for this visit. There is no height or weight on file to calculate BMI. Physical Exam Vitals and nursing note reviewed.  Constitutional:      Appearance: Normal appearance.  HENT:     Head: Normocephalic.     Mouth/Throat:     Mouth: Mucous membranes are moist.     Pharynx: Oropharynx is clear.  Eyes:     Extraocular Movements: Extraocular movements intact.     Pupils: Pupils are equal, round, and reactive to light.  Cardiovascular:     Rate and Rhythm: Normal rate and regular rhythm.  Pulmonary:     Effort: Pulmonary effort is normal.     Breath sounds:  Normal breath sounds.  Abdominal:     General: Bowel sounds are normal.     Palpations: Abdomen is soft.  Neurological:     General: No focal deficit present.     Mental Status: She is alert and oriented to person, place, and time.  Psychiatric:        Mood and Affect: Mood normal.        Behavior: Behavior normal.     Labs reviewed: Basic Metabolic Panel: Recent Labs    02/24/23 0120  NA 136  K 4.1  CL 101  CO2 24  GLUCOSE 160*  BUN 16  CREATININE 0.80  CALCIUM 8.7*   Liver Function Tests: No results for input(s): "AST", "ALT", "ALKPHOS", "BILITOT", "PROT", "ALBUMIN" in the last 8760 hours. No results for input(s): "LIPASE", "AMYLASE" in the last 8760 hours. No results for input(s): "AMMONIA" in the last 8760 hours. CBC: Recent Labs    02/24/23 0120  WBC 13.6*  NEUTROABS 11.9*  HGB 10.4*  HCT 32.1*  MCV 98.2  PLT 318   Cardiac Enzymes: No results for input(s): "CKTOTAL", "CKMB", "  CKMBINDEX", "TROPONINI" in the last 8760 hours. BNP: Invalid input(s): "POCBNP" No results found for: "HGBA1C" Lab Results  Component Value Date   TSH 2.88 03/04/2020   Lab Results  Component Value Date   [id number] 03/04/2020   No results found for: "FOLATE" No results found for: "IRON", "TIBC", "FERRITIN"  Imaging and Procedures obtained prior to SNF admission: CT Hip Right Wo Contrast  Result Date: 02/24/2023 CLINICAL DATA:  Hip trauma, fracture suspected, xray done EXAM: CT OF THE RIGHT HIP WITHOUT CONTRAST TECHNIQUE: Multidetector CT imaging of the right hip was performed according to the standard protocol. Multiplanar CT image reconstructions were also generated. RADIATION DOSE REDUCTION: This exam was performed according to the departmental dose-optimization program which includes automated exposure control, adjustment of the mA and/or kV according to patient size and/or use of iterative reconstruction technique. COMPARISON:  X-ray right hip 02/24/2023 FINDINGS:  Bones/Joint/Cartilage Right hip total arthroplasty. Acute periprosthetic comminuted fracture of the proximal femoral shaft and intertrochanteric region. Vague cortical irregularity of the right inferior and superior pubic rami with no definite fractures. Normal alignment of the right hip surgical hardware. No aggressive appearing focal bone abnormality. Ligaments Suboptimally assessed by CT. Muscles and Tendons Grossly unremarkable. Soft tissues Mild subcutaneus soft tissue edema.  No large hematoma formation. Other: Colonic diverticulosis.  Atherosclerotic plaque. IMPRESSION: Acute periprosthetic comminuted fracture of the proximal femoral shaft and intertrochanteric region. Electronically Signed   By: Iven Finn M.D.   On: 02/24/2023 03:12   DG Chest 1 View  Result Date: 02/24/2023 CLINICAL DATA:  [id number] Fall 190176 EXAM: CHEST  1 VIEW COMPARISON:  Chest x-ray 09/24/2008 FINDINGS: The heart and mediastinal contours are unchanged. Aortic calcification. Biapical pleural/pulmonary scarring. Left lung patchy airspace and interstitial opacities more prominent along the left lower lung zone. No pulmonary edema. No pleural effusion. No pneumothorax. No acute osseous abnormality. IMPRESSION: 1. Left lung patchy airspace and interstitial opacities more prominent along the left lower lung zone suggestive of infection/inflammation. Followup PA and lateral chest X-ray is recommended in 3-4 weeks following therapy to ensure resolution and exclude underlying malignancy. 2.  Emphysema (ICD10-J43.9). Electronically Signed   By: Iven Finn M.D.   On: 02/24/2023 01:28   DG Knee Complete 4 Views Right  Result Date: 02/24/2023 CLINICAL DATA:  [id number] Fall 190176 EXAM: RIGHT KNEE - COMPLETE 4+ VIEW COMPARISON:  None Available. FINDINGS: No evidence of fracture, dislocation, or joint effusion. Mild degenerative changes of the knee. Soft tissues are unremarkable. Vascular calcification. IMPRESSION: No acute displaced  fracture or dislocation. Electronically Signed   By: Iven Finn M.D.   On: 02/24/2023 01:26   DG Hip Unilat With Pelvis 2-3 Views Right  Result Date: 02/24/2023 CLINICAL DATA:  pain EXAM: DG HIP (WITH OR WITHOUT PELVIS) 2-3V RIGHT COMPARISON:  None Available. FINDINGS: Right hip total arthroplasty. Acute periprosthetic comminuted fracture of the proximal femoral cortex. Normal alignment of the right hip surgical hardware. There is no evidence of hip fracture or dislocation of the left hip on frontal view. At least moderate degenerative changes of the left hip. No focal bone abnormality. No acute displaced fracture or diastasis of the bones of the pelvis. Degenerative changes of the visualized lower lumbar spine. IMPRESSION: Acute periprosthetic comminuted fracture of the proximal femoral cortex in a patient status post total right hip arthroplasty. Electronically Signed   By: Iven Finn M.D.   On: 02/24/2023 01:26    Assessment/Plan 1. Insomnia, unspecified type She has been taking combination of Ambien  as well as trazodone at nighttime.  2 discontinue either 1 would probably be more difficult than continuing  2. Pneumonia of left lower lobe due to infectious organism Patient is afebrile.  She experienced no cough during the course of our interview and exam and her lungs are clear  3. 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, initial encounter Ongoing physical therapy.  Hopefully she will be able to return to independent living posttherapy    Family/ staff Communication:   Labs/tests ordered:  Lillette Boxer. Sabra Heck, [place] [address] [place], [place] Office [phone]

## 2023-03-10 DIAGNOSIS — M9701XA Periprosthetic fracture around internal prosthetic right hip joint, initial encounter: Secondary | ICD-10-CM | POA: Diagnosis not present

## 2023-03-14 DIAGNOSIS — M2042 Other hammer toe(s) (acquired), left foot: Secondary | ICD-10-CM | POA: Diagnosis not present

## 2023-03-14 DIAGNOSIS — L602 Onychogryphosis: Secondary | ICD-10-CM | POA: Diagnosis not present

## 2023-03-14 DIAGNOSIS — M79672 Pain in left foot: Secondary | ICD-10-CM | POA: Diagnosis not present

## 2023-03-14 DIAGNOSIS — M2041 Other hammer toe(s) (acquired), right foot: Secondary | ICD-10-CM | POA: Diagnosis not present

## 2023-03-14 DIAGNOSIS — M79671 Pain in right foot: Secondary | ICD-10-CM | POA: Diagnosis not present

## 2023-03-17 ENCOUNTER — Non-Acute Institutional Stay (SKILLED_NURSING_FACILITY): Payer: Medicare HMO | Admitting: Nurse Practitioner

## 2023-03-17 ENCOUNTER — Encounter: Payer: Self-pay | Admitting: Nurse Practitioner

## 2023-03-17 DIAGNOSIS — Z96649 Presence of unspecified artificial hip joint: Secondary | ICD-10-CM

## 2023-03-17 DIAGNOSIS — N183 Chronic kidney disease, stage 3 unspecified: Secondary | ICD-10-CM | POA: Insufficient documentation

## 2023-03-17 DIAGNOSIS — G4700 Insomnia, unspecified: Secondary | ICD-10-CM | POA: Diagnosis not present

## 2023-03-17 DIAGNOSIS — R300 Dysuria: Secondary | ICD-10-CM | POA: Insufficient documentation

## 2023-03-17 DIAGNOSIS — J189 Pneumonia, unspecified organism: Secondary | ICD-10-CM | POA: Diagnosis not present

## 2023-03-17 DIAGNOSIS — N1831 Chronic kidney disease, stage 3a: Secondary | ICD-10-CM

## 2023-03-17 DIAGNOSIS — D649 Anemia, unspecified: Secondary | ICD-10-CM

## 2023-03-17 DIAGNOSIS — M978XXS Periprosthetic fracture around other internal prosthetic joint, sequela: Secondary | ICD-10-CM

## 2023-03-17 NOTE — Assessment & Plan Note (Signed)
Periprosthetic fx around internal prosthetic R hip joint, f/u Ortho, 50% WB 03/10/23, non operative approach.

## 2023-03-17 NOTE — Progress Notes (Signed)
Location:   [facility] Room Number: 22-A Place of Service:  SNF (31) Provider:  Letonya Mangels, NP  PCP: Mahlon Gammon, MD  Patient Care Team: Mahlon Gammon, MD as PCP - General (Internal Medicine) Odette Fraction, MD (Inactive) as Consulting Physician (Anesthesiology) Rene Paci, MD as Consulting Physician (Urology)  Extended Emergency Contact Information Primary Emergency Contact: Bailyn, Spackman Mobile Phone: [phone] Relation: Daughter Secondary Emergency Contact: Ulyses Jarred [place] Mobile Phone: [phone] Relation: Daughter  Code Status:  DNR Goals of care: Advanced Directive information    03/17/2023   11:38 AM  Advanced Directives  Does Patient Have a Medical Advance Directive? Yes  Type of Advance Directive Out of facility DNR (pink MOST or yellow form)  Does patient want to make changes to medical advance directive? No - Patient declined     Chief Complaint  Patient presents with   Acute Visit    Burning sensation upon urinating.     HPI:  Pt is a 89 y.o. female seen today for an acute visit for burning sensation upon urination, noted incontinent episodes, she is afebrile, denied lower abd or back pressure/discomfort. Takes AZO daily  Hospitalized 02/24/23-03/03/23 for   Periprosthetic fx around internal prosthetic R hip joint, f/u Ortho, 50% WB, non operative approach.   LLL PNA, fully treated  Insomnia/depression/anxiety, on Citalopram, Trazodone, Ambien  Hx of  CKD Bun/creat 16/0.8 02/24/23  HLD, not on statin  PVD No open wounds  Anemia, Hgb 10.4 02/24/23   Past Medical History:  Diagnosis Date   Arthritis    Back pain    Chronic kidney disease, stage 3a    Insomnia    Leg pain    Neck pain    History reviewed. No pertinent surgical history.  No Known Allergies  Allergies as of 03/17/2023   No Known Allergies      Medication List        Accurate as  of March 17, 2023  1:34 PM. If you have any questions, ask your nurse or doctor.          acetaminophen 325 MG tablet Commonly known as: TYLENOL Take 2 tablets (650 mg total) by mouth every 6 (six) hours as needed for mild pain (or Fever >/= 101).   AZO CRANBERRY GUMMIES PO Take by mouth. 2 each day   calcium carbonate 1500 (600 Ca) MG Tabs tablet Commonly known as: OSCAL Take 1,500 mg by mouth daily at 6 (six) AM.   citalopram 10 MG tablet Commonly known as: CELEXA Take 10 mg by mouth daily.   HYDROcodone-acetaminophen 5-325 MG tablet Commonly known as: NORCO/VICODIN Take 1 tablet by mouth every 4 (four) hours as needed for moderate pain.   latanoprost 0.005 % ophthalmic solution Commonly known as: XALATAN Place 1 drop into both eyes at bedtime.   multivitamin tablet Take 1 tablet by mouth daily.   naproxen 500 MG tablet Commonly known as: NAPROSYN Take 500 mg by mouth as needed.   ondansetron 4 MG tablet Commonly known as: ZOFRAN Take 1 tablet (4 mg total) by mouth every 6 (six) hours as needed for nausea.   senna 8.6 MG Tabs tablet Commonly known as: SENOKOT Take 1 tablet (8.6 mg total) by mouth daily as needed for mild constipation.   traZODone 50 MG tablet Commonly known as: DESYREL Take 25 mg by mouth at bedtime as needed.   zolpidem  5 MG tablet Commonly known as: AMBIEN Take 5 mg by mouth daily.        Review of Systems  Constitutional:  Negative for appetite change, fatigue and fever.  HENT:  Negative for congestion and trouble swallowing.   Eyes:  Negative for visual disturbance.  Respiratory:  Negative for cough and shortness of breath.   Cardiovascular:  Negative for leg swelling.  Gastrointestinal:  Negative for abdominal pain, constipation, nausea and vomiting.  Genitourinary:  Positive for dysuria, frequency and urgency.       Incontinent of urine.   Musculoskeletal:  Positive for arthralgias and gait problem.  Skin:  Negative for color  change.  Neurological:  Negative for weakness and headaches.  Psychiatric/Behavioral:  Negative for behavioral problems, confusion and sleep disturbance. The patient is not nervous/anxious.     Immunization History  Administered Date(s) Administered   Influenza Split 11/02/2010, 08/12/2012, 09/03/2013, 09/24/2014, 09/16/2020, 09/16/2021   Influenza, High Dose Seasonal PF 09/05/2019   Influenza, Quadrivalent, Recombinant, Inj, Pf 09/11/2018   Influenza,inj,Quad PF,6+ Mos 09/24/2014   Influenza-Unspecified 08/24/2015, 09/20/2017, 11/11/2020   Moderna Sars-Covid-2 Vaccination 12/16/2019, 01/13/2020   Pneumococcal Conjugate-13 09/04/2017   Pneumococcal Polysaccharide-23 10/26/2007   Td (Adult),5 Lf Tetanus Toxid, Preservative Free 11/13/2012   Pertinent  Health Maintenance Due  Topic Date Due   DEXA SCAN  Never done   INFLUENZA VACCINE  07/13/2023      01/05/2020   12:31 PM 04/17/2020    3:14 PM  Fall Risk  Falls in the past year?  0  Was there an injury with Fall?  0  Fall Risk Category Calculator  0  Fall Risk Category (Retired)  Low  (RETIRED) Patient Fall Risk Level Moderate fall risk Low fall risk   Functional Status Survey:    Vitals:   03/17/23 1135  BP: 124/60  Pulse: 90  Resp: 15  Temp: 98.3 F (36.8 C)  SpO2: 95%  Weight: 99 lb (44.9 kg)  Height: 5\' 2"  (1.575 m)   Body mass index is 18.11 kg/m. Physical Exam Vitals and nursing note reviewed.  Constitutional:      Appearance: Normal appearance.  HENT:     Head: Normocephalic and atraumatic.     Nose: Nose normal.  Eyes:     Extraocular Movements: Extraocular movements intact.     Pupils: Pupils are equal, round, and reactive to light.  Cardiovascular:     Rate and Rhythm: Normal rate and regular rhythm.     Heart sounds: No murmur heard. Pulmonary:     Effort: Pulmonary effort is normal.     Breath sounds: No rales.  Abdominal:     General: Bowel sounds are normal.     Palpations: Abdomen is soft.      Tenderness: There is no abdominal tenderness. There is no right CVA tenderness, left CVA tenderness, guarding or rebound.  Musculoskeletal:     Cervical back: Normal range of motion and neck supple.     Right lower leg: No edema.     Left lower leg: No edema.  Skin:    General: Skin is warm and dry.  Neurological:     General: No focal deficit present.     Mental Status: She is alert and oriented to person, place, and time. Mental status is at baseline.     Gait: Gait abnormal.  Psychiatric:        Mood and Affect: Mood normal.        Behavior: Behavior normal.  Thought Content: Thought content normal.     Labs reviewed: Recent Labs    02/24/23 0120  NA 136  K 4.1  CL 101  CO2 24  GLUCOSE 160*  BUN 16  CREATININE 0.80  CALCIUM 8.7*   No results for input(s): "AST", "ALT", "ALKPHOS", "BILITOT", "PROT", "ALBUMIN" in the last 8760 hours. Recent Labs    02/24/23 0120  WBC 13.6*  NEUTROABS 11.9*  HGB 10.4*  HCT 32.1*  MCV 98.2  PLT 318   Lab Results  Component Value Date   TSH 2.88 03/04/2020   No results found for: "HGBA1C" Lab Results  Component Value Date   CHOL 248 (H) 03/04/2020   HDL 87 03/04/2020   LDLCALC 139 (H) 03/04/2020   TRIG 102 03/04/2020   CHOLHDL 2.9 03/04/2020    Significant Diagnostic Results in last 30 days:  CT Hip Right Wo Contrast  Result Date: 02/24/2023 CLINICAL DATA:  Hip trauma, fracture suspected, xray done EXAM: CT OF THE RIGHT HIP WITHOUT CONTRAST TECHNIQUE: Multidetector CT imaging of the right hip was performed according to the standard protocol. Multiplanar CT image reconstructions were also generated. RADIATION DOSE REDUCTION: This exam was performed according to the departmental dose-optimization program which includes automated exposure control, adjustment of the mA and/or kV according to patient size and/or use of iterative reconstruction technique. COMPARISON:  X-ray right hip 02/24/2023 FINDINGS:  Bones/Joint/Cartilage Right hip total arthroplasty. Acute periprosthetic comminuted fracture of the proximal femoral shaft and intertrochanteric region. Vague cortical irregularity of the right inferior and superior pubic rami with no definite fractures. Normal alignment of the right hip surgical hardware. No aggressive appearing focal bone abnormality. Ligaments Suboptimally assessed by CT. Muscles and Tendons Grossly unremarkable. Soft tissues Mild subcutaneus soft tissue edema.  No large hematoma formation. Other: Colonic diverticulosis.  Atherosclerotic plaque. IMPRESSION: Acute periprosthetic comminuted fracture of the proximal femoral shaft and intertrochanteric region. Electronically Signed   By: Tish Frederickson M.D.   On: 02/24/2023 03:12   DG Chest 1 View  Result Date: 02/24/2023 CLINICAL DATA:  [id number] Fall [id number] EXAM: CHEST  1 VIEW COMPARISON:  Chest x-ray 09/24/2008 FINDINGS: The heart and mediastinal contours are unchanged. Aortic calcification. Biapical pleural/pulmonary scarring. Left lung patchy airspace and interstitial opacities more prominent along the left lower lung zone. No pulmonary edema. No pleural effusion. No pneumothorax. No acute osseous abnormality. IMPRESSION: 1. Left lung patchy airspace and interstitial opacities more prominent along the left lower lung zone suggestive of infection/inflammation. Followup PA and lateral chest X-ray is recommended in 3-4 weeks following therapy to ensure resolution and exclude underlying malignancy. 2.  Emphysema (ICD10-J43.9). Electronically Signed   By: Tish Frederickson M.D.   On: 02/24/2023 01:28   DG Knee Complete 4 Views Right  Result Date: 02/24/2023 CLINICAL DATA:  [id number] Fall [id number] EXAM: RIGHT KNEE - COMPLETE 4+ VIEW COMPARISON:  None Available. FINDINGS: No evidence of fracture, dislocation, or joint effusion. Mild degenerative changes of the knee. Soft tissues are unremarkable. Vascular calcification. IMPRESSION: No acute displaced  fracture or dislocation. Electronically Signed   By: Tish Frederickson M.D.   On: 02/24/2023 01:26   DG Hip Unilat With Pelvis 2-3 Views Right  Result Date: 02/24/2023 CLINICAL DATA:  pain EXAM: DG HIP (WITH OR WITHOUT PELVIS) 2-3V RIGHT COMPARISON:  None Available. FINDINGS: Right hip total arthroplasty. Acute periprosthetic comminuted fracture of the proximal femoral cortex. Normal alignment of the right hip surgical hardware. There is no evidence of hip fracture or dislocation of the left  hip on frontal view. At least moderate degenerative changes of the left hip. No focal bone abnormality. No acute displaced fracture or diastasis of the bones of the pelvis. Degenerative changes of the visualized lower lumbar spine. IMPRESSION: Acute periprosthetic comminuted fracture of the proximal femoral cortex in a patient status post total right hip arthroplasty. Electronically Signed   By: Tish FredericksonMorgane  Naveau M.D.   On: 02/24/2023 01:26    Assessment/Plan Dysuria UA C/S, monitor for s/s of UTI  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, sequela Periprosthetic fx around internal prosthetic R hip joint, f/u Ortho, 50% WB 03/10/23, non operative approach.   LLL pneumonia Healed clinically  Insomnia Insomnia/depression/anxiety, on Citalopram, Trazodone, Ambien  CKD (chronic kidney disease) stage 3, GFR 30-59 ml/min Bun/creat 16/0.8 02/24/23     Family/ staff Communication: plan of care reviewed with the patient and charge nurse.   Labs/tests ordered:   UA C/S   Time spend 35 minutes.

## 2023-03-17 NOTE — Assessment & Plan Note (Signed)
Bun/creat 16/0.8 02/24/23

## 2023-03-17 NOTE — Assessment & Plan Note (Signed)
Hgb 10.4 02/24/23

## 2023-03-17 NOTE — Assessment & Plan Note (Signed)
UA C/S, monitor for s/s of UTI

## 2023-03-17 NOTE — Assessment & Plan Note (Signed)
Insomnia/depression/anxiety, on Citalopram, Trazodone, Ambien

## 2023-03-17 NOTE — Assessment & Plan Note (Signed)
Healed clinically.  

## 2023-03-20 ENCOUNTER — Encounter: Payer: Self-pay | Admitting: Nurse Practitioner

## 2023-03-20 DIAGNOSIS — W19XXXA Unspecified fall, initial encounter: Secondary | ICD-10-CM | POA: Insufficient documentation

## 2023-03-21 DIAGNOSIS — M6281 Muscle weakness (generalized): Secondary | ICD-10-CM | POA: Diagnosis not present

## 2023-03-21 DIAGNOSIS — R2689 Other abnormalities of gait and mobility: Secondary | ICD-10-CM | POA: Diagnosis not present

## 2023-03-21 DIAGNOSIS — R262 Difficulty in walking, not elsewhere classified: Secondary | ICD-10-CM | POA: Diagnosis not present

## 2023-03-23 DIAGNOSIS — M6281 Muscle weakness (generalized): Secondary | ICD-10-CM | POA: Diagnosis not present

## 2023-03-23 DIAGNOSIS — R262 Difficulty in walking, not elsewhere classified: Secondary | ICD-10-CM | POA: Diagnosis not present

## 2023-03-23 DIAGNOSIS — R2689 Other abnormalities of gait and mobility: Secondary | ICD-10-CM | POA: Diagnosis not present

## 2023-03-27 ENCOUNTER — Non-Acute Institutional Stay (SKILLED_NURSING_FACILITY): Payer: Medicare HMO | Admitting: Nurse Practitioner

## 2023-03-27 ENCOUNTER — Encounter: Payer: Self-pay | Admitting: Nurse Practitioner

## 2023-03-27 DIAGNOSIS — G4700 Insomnia, unspecified: Secondary | ICD-10-CM | POA: Diagnosis not present

## 2023-03-27 DIAGNOSIS — M6281 Muscle weakness (generalized): Secondary | ICD-10-CM | POA: Diagnosis not present

## 2023-03-27 DIAGNOSIS — M978XXS Periprosthetic fracture around other internal prosthetic joint, sequela: Secondary | ICD-10-CM | POA: Diagnosis not present

## 2023-03-27 DIAGNOSIS — Z96649 Presence of unspecified artificial hip joint: Secondary | ICD-10-CM

## 2023-03-27 DIAGNOSIS — N1831 Chronic kidney disease, stage 3a: Secondary | ICD-10-CM | POA: Diagnosis not present

## 2023-03-27 DIAGNOSIS — R262 Difficulty in walking, not elsewhere classified: Secondary | ICD-10-CM | POA: Diagnosis not present

## 2023-03-27 DIAGNOSIS — R2689 Other abnormalities of gait and mobility: Secondary | ICD-10-CM | POA: Diagnosis not present

## 2023-03-27 NOTE — Assessment & Plan Note (Signed)
Bun/creat 16/0.8 02/24/23 

## 2023-03-27 NOTE — Assessment & Plan Note (Signed)
Stable, on Citalopram, Trazodone, Ambien, added Melatonin.

## 2023-03-27 NOTE — Assessment & Plan Note (Signed)
Hgb 10.4 02/24/23  

## 2023-03-27 NOTE — Progress Notes (Signed)
Location:  [facility] Room Number: 22-A Place of Service:  SNF [phone]) Provider:  Komal Stangelo Dicie Beam    Patient Care Team: Mahlon Gammon, MD as PCP - General (Internal Medicine) Odette Fraction, MD (Inactive) as Consulting Physician (Anesthesiology) Rene Paci, MD as Consulting Physician (Urology)  Extended Emergency Contact Information Primary Emergency Contact: Danahi, Reddish Mobile Phone: [phone] Relation: Daughter Secondary Emergency Contact: Ulyses Jarred [place] Mobile Phone: [phone] Relation: Daughter  Code Status:  DNR Goals of care: Advanced Directive information    03/27/2023   10:39 AM  Advanced Directives  Does Patient Have a Medical Advance Directive? Yes  Type of Advance Directive Out of facility DNR (pink MOST or yellow form)  Does patient want to make changes to medical advance directive? No - Patient declined  Pre-existing out of facility DNR order (yellow form or pink MOST form) Yellow form placed in chart (order not valid for inpatient use)     Chief Complaint  Patient presents with  . Routine    HPI:  Pt is a 89 y.o. female seen today for medical management of chronic diseases.      Hospitalized 02/24/23-03/03/23 for              Periprosthetic fx around internal prosthetic R hip joint, f/u Ortho, 50% WB, non operative approach.              LLL PNA, fully treated             Insomnia/depression/anxiety, on Citalopram, Trazodone, Ambien             Hx of CKD Bun/creat 16/0.8 02/24/23             HLD, not on statin             PVD     No open wounds             Anemia, Hgb 10.4 02/24/23   Past Medical History:  Diagnosis Date  . Arthritis   . Back pain   . Chronic kidney disease, stage 3a   . Insomnia   . Leg pain   . Neck pain    History reviewed. No pertinent surgical history.  No Known Allergies  Outpatient Encounter Medications as of 03/27/2023   Medication Sig  . acetaminophen (TYLENOL) 325 MG tablet Take 2 tablets (650 mg total) by mouth every 6 (six) hours as needed for mild pain (or Fever >/= 101).  Concepcion Elk CRANBERRY GUMMIES PO Take by mouth. 2 each day  . calcium carbonate (OSCAL) 1500 (600 Ca) MG TABS tablet Take 1,500 mg by mouth daily at 6 (six) AM.  . citalopram (CELEXA) 10 MG tablet Take 10 mg by mouth daily.  Marland Kitchen HYDROcodone-acetaminophen (NORCO/VICODIN) 5-325 MG tablet Take 1 tablet by mouth every 4 (four) hours as needed for moderate pain.  Marland Kitchen latanoprost (XALATAN) 0.005 % ophthalmic solution Place 1 drop into both eyes at bedtime.  . Multiple Vitamin (MULTIVITAMIN) tablet Take 1 tablet by mouth daily.  . naproxen (NAPROSYN) 500 MG tablet Take 500 mg by mouth as needed.  . ondansetron (ZOFRAN) 4 MG tablet Take 1 tablet (4 mg total) by mouth every 6 (six) hours as needed for nausea.  Marland Kitchen senna (SENOKOT) 8.6 MG TABS tablet Take 1 tablet (8.6 mg total) by mouth daily as needed for mild constipation.  Marland Kitchen zolpidem (AMBIEN) 5 MG tablet Take 5  mg by mouth daily.  . [DISCONTINUED] traZODone (DESYREL) 50 MG tablet Take 25 mg by mouth at bedtime as needed.   No facility-administered encounter medications on file as of 03/27/2023.    Review of Systems  Constitutional:  Negative for appetite change, fatigue and fever.  HENT:  Negative for congestion and trouble swallowing.   Eyes:  Negative for visual disturbance.  Respiratory:  Negative for cough and shortness of breath.   Cardiovascular:  Negative for leg swelling.  Gastrointestinal:  Negative for abdominal pain, constipation, nausea and vomiting.  Genitourinary:  Positive for frequency. Negative for dysuria and urgency.       Incontinent of urine.   Musculoskeletal:  Positive for arthralgias and gait problem.  Skin:  Negative for color change.  Neurological:  Negative for weakness and headaches.  Psychiatric/Behavioral:  Negative for behavioral problems, confusion and sleep  disturbance. The patient is not nervous/anxious.     Immunization History  Administered Date(s) Administered  . Influenza Split 11/02/2010, 08/12/2012, 09/03/2013, 09/24/2014, 09/16/2020, 09/16/2021  . Influenza, High Dose Seasonal PF 09/05/2019  . Influenza, Quadrivalent, Recombinant, Inj, Pf 09/11/2018  . Influenza,inj,Quad PF,6+ Mos 09/24/2014  . Influenza-Unspecified 08/24/2015, 09/20/2017, 11/11/2020  . Moderna Sars-Covid-2 Vaccination 12/16/2019, 01/13/2020  . Pneumococcal Conjugate-13 09/04/2017  . Pneumococcal Polysaccharide-23 10/26/2007  . Td (Adult),5 Lf Tetanus Toxid, Preservative Free 11/13/2012   Pertinent  Health Maintenance Due  Topic Date Due  . DEXA SCAN  Never done  . INFLUENZA VACCINE  07/13/2023      01/05/2020   12:31 PM 04/17/2020    3:14 PM  Fall Risk  Falls in the past year?  0  Was there an injury with Fall?  0  Fall Risk Category Calculator  0  Fall Risk Category (Retired)  Low  (RETIRED) Patient Fall Risk Level Moderate fall risk Low fall risk   Functional Status Survey:    Vitals:   03/27/23 1033  BP: 132/74  Pulse: 72  Resp: 16  Temp: 98.7 F (37.1 C)  SpO2: 96%  Weight: 98 lb 14.4 oz (44.9 kg)  Height:  (1.575 m)   Body mass index is 18.09 kg/m. Physical Exam Vitals and nursing note reviewed.  Constitutional:      Appearance: Normal appearance.  HENT:     Head: Normocephalic and atraumatic.     Nose: Nose normal.  Eyes:     Extraocular Movements: Extraocular movements intact.     Pupils: Pupils are equal, round, and reactive to light.  Cardiovascular:     Rate and Rhythm: Normal rate and regular rhythm.     Heart sounds: No murmur heard. Pulmonary:     Effort: Pulmonary effort is normal.     Breath sounds: No rales.  Abdominal:     General: Bowel sounds are normal.     Palpations: Abdomen is soft.     Tenderness: There is no abdominal tenderness. There is no right CVA tenderness, left CVA tenderness, guarding or  rebound.  Musculoskeletal:     Cervical back: Normal range of motion and neck supple.     Right lower leg: No edema.     Left lower leg: No edema.  Skin:    General: Skin is warm and dry.  Neurological:     General: No focal deficit present.     Mental Status: She is alert and oriented to person, place, and time. Mental status is at baseline.     Gait: Gait abnormal.  Psychiatric:  Mood and Affect: Mood normal.        Behavior: Behavior normal.        Thought Content: Thought content normal.    Labs reviewed: Recent Labs    02/24/23 0120  NA 136  K 4.1  CL 101  CO2 24  GLUCOSE 160*  BUN 16  CREATININE 0.80  CALCIUM 8.7*   No results for input(s): "AST", "ALT", "ALKPHOS", "BILITOT", "PROT", "ALBUMIN" in the last 8760 hours. Recent Labs    02/24/23 0120  WBC 13.6*  NEUTROABS 11.9*  HGB 10.4*  HCT 32.1*  MCV 98.2  PLT 318   Lab Results  Component Value Date   TSH 2.88 03/04/2020   No results found for: "HGBA1C" Lab Results  Component Value Date   CHOL 248 (H) 03/04/2020   HDL 87 03/04/2020   LDLCALC 139 (H) 03/04/2020   TRIG 102 03/04/2020   CHOLHDL 2.9 03/04/2020    Significant Diagnostic Results in last 30 days:  No results found.  Assessment/Plan No problem-specific Assessment & Plan notes found for this encounter.     Family/ staff Communication: plan of care reviewed with the patient and charge nurse.   Labs/tests ordered:  none  Time spend 35 minutes.

## 2023-03-27 NOTE — Assessment & Plan Note (Signed)
Hospitalized 02/24/23-03/03/23 for              Periprosthetic fx around internal prosthetic R hip joint, f/u Ortho, 50% WB, non operative approach.

## 2023-03-29 DIAGNOSIS — R2689 Other abnormalities of gait and mobility: Secondary | ICD-10-CM | POA: Diagnosis not present

## 2023-03-29 DIAGNOSIS — M6281 Muscle weakness (generalized): Secondary | ICD-10-CM | POA: Diagnosis not present

## 2023-03-29 DIAGNOSIS — R262 Difficulty in walking, not elsewhere classified: Secondary | ICD-10-CM | POA: Diagnosis not present

## 2023-03-30 ENCOUNTER — Encounter: Payer: Self-pay | Admitting: Nurse Practitioner

## 2023-03-30 DIAGNOSIS — R262 Difficulty in walking, not elsewhere classified: Secondary | ICD-10-CM | POA: Diagnosis not present

## 2023-03-30 DIAGNOSIS — M6281 Muscle weakness (generalized): Secondary | ICD-10-CM | POA: Diagnosis not present

## 2023-03-30 DIAGNOSIS — R2689 Other abnormalities of gait and mobility: Secondary | ICD-10-CM | POA: Diagnosis not present

## 2023-03-31 ENCOUNTER — Encounter: Payer: Self-pay | Admitting: Adult Health

## 2023-03-31 ENCOUNTER — Non-Acute Institutional Stay (SKILLED_NURSING_FACILITY): Payer: Medicare HMO | Admitting: Adult Health

## 2023-03-31 DIAGNOSIS — J189 Pneumonia, unspecified organism: Secondary | ICD-10-CM | POA: Diagnosis not present

## 2023-03-31 DIAGNOSIS — Z96649 Presence of unspecified artificial hip joint: Secondary | ICD-10-CM

## 2023-03-31 DIAGNOSIS — G4700 Insomnia, unspecified: Secondary | ICD-10-CM | POA: Diagnosis not present

## 2023-03-31 DIAGNOSIS — D649 Anemia, unspecified: Secondary | ICD-10-CM | POA: Diagnosis not present

## 2023-03-31 DIAGNOSIS — M978XXS Periprosthetic fracture around other internal prosthetic joint, sequela: Secondary | ICD-10-CM | POA: Diagnosis not present

## 2023-03-31 NOTE — Progress Notes (Unsigned)
Location:  [facility] Room Number: 22A Place of Service:  SNF ([id number])  Provider: Margit Banda. Grayce Sessions, NP   PCP: Mahlon Gammon, MD Patient Care Team: Mahlon Gammon, MD as PCP - General (Internal Medicine) Odette Fraction, MD (Inactive) as Consulting Physician (Anesthesiology) Rene Paci, MD as Consulting Physician (Urology)  Extended Emergency Contact Information Primary Emergency Contact: Addasyn, Mcbreen Mobile Phone: [phone] Relation: Daughter Secondary Emergency Contact: Ulyses Jarred [place] Mobile Phone: [phone] Relation: Daughter  Code Status: DNR Goals of care:  Advanced Directive information    03/31/2023   10:10 AM  Advanced Directives  Does Patient Have a Medical Advance Directive? Yes  Type of Advance Directive Out of facility DNR (pink MOST or yellow form)  Does patient want to make changes to medical advance directive? No - Patient declined  Pre-existing out of facility DNR order (yellow form or pink MOST form) Yellow form placed in chart (order not valid for inpatient use)     No Known Allergies  Chief Complaint  Patient presents with   Acute Visit    discharge to [facility]    HPI:  This is a 89 y.o. female who is for discharge to [facility] today, 03/31/23.   She was admitted to [facility] on 03/03/23 post hospitalization 02/24/23 to 03/03/23. She had a fall for which she sustained a right periprosthetic fracture.Orthopedics felt that the type of fracture was best managed nonoperatively with 50% weight-bearing. She was incidentally noted to have LLL infiltrate for which she completed 5 days of antibiotics. She was discharged to [facility] for short-term rehabilitation.  Patient requested to be transferred to [facility] since her friends are there. She continues to be on 50% weight-bearing.    Past  Medical History:  Diagnosis Date   Arthritis    Back pain    Chronic kidney disease, stage 3a    Insomnia    Leg pain    Neck pain     History reviewed. No pertinent surgical history.    reports that she has never smoked. She has never used smokeless tobacco. She reports current alcohol use of about 14.0 standard drinks of alcohol per week. She reports that she does not use drugs. Social History   Socioeconomic History   Marital status: Widowed    Spouse name: Not on file   Number of children: Not on file   Years of education: Not on file   Highest education level: Not on file  Occupational History   Not on file  Tobacco Use   Smoking status: Never   Smokeless tobacco: Never  Vaping Use   Vaping Use: Never used  Substance and Sexual Activity   Alcohol use: Yes    Alcohol/week: 14.0 standard drinks of alcohol    Types: 14 Standard drinks or equivalent per week   Drug use: Never   Sexual activity: Not on file  Other Topics Concern   Not on file  Social History Narrative   Social History      Diet? light      Do you drink/eat things with caffeine? yes      Marital status?               Widowed  What year were you married? 1946      Do you live in a house, apartment, assisted living, condo, trailer, etc.? Apartment retirement community       Is it one or more stories? yes      How many persons live in your home? 1      Do you have any pets in your home? (please list) no       Highest level of education completed? 2 years college       Current or past profession:      Do you exercise?         yes                             Type & how often? Water aerobics       Advanced Directives      Do you have a living will? yes      Do you have a DNR form?                  yes                If not, do you want to discuss one? yes      Do you have signed POA/HPOA for forms? yes      Functional Status      Do you have difficulty bathing or  dressing yourself? No       Do you have difficulty preparing food or eating? No       Do you have difficulty managing your medications? No       Do you have difficulty managing your finances? No       Do you have difficulty affording your medications? No       Social Determinants of Health   Financial Resource Strain: Not on file  Food Insecurity: No Food Insecurity (02/24/2023)   Hunger Vital Sign    Worried About Running Out of Food in the Last Year: Never true    Ran Out of Food in the Last Year: Never true  Transportation Needs: No Transportation Needs (02/24/2023)   PRAPARE - Administrator, Civil Service (Medical): No    Lack of Transportation (Non-Medical): No  Physical Activity: Not on file  Stress: Not on file  Social Connections: Not on file  Intimate Partner Violence: Not At Risk (02/24/2023)   Humiliation, Afraid, Rape, and Kick questionnaire    Fear of Current or Ex-Partner: No    Emotionally Abused: No    Physically Abused: No    Sexually Abused: No   Functional Status Survey:    No Known Allergies  Pertinent  Health Maintenance Due  Topic Date Due   DEXA SCAN  Never done   INFLUENZA VACCINE  07/13/2023    Medications: Outpatient Encounter Medications as of 03/31/2023  Medication Sig   acetaminophen (TYLENOL) 325 MG tablet Take 2 tablets (650 mg total) by mouth every 6 (six) hours as needed for mild pain (or Fever >/= 101).   acetaminophen (TYLENOL) 500 MG tablet Take 500 mg by mouth every 8 (eight) hours as needed for moderate pain.   AZO CRANBERRY GUMMIES PO Take by mouth. 2 each day   calcium carbonate (OSCAL) 1500 (600 Ca) MG TABS tablet Take 1,500 mg by mouth daily at 6 (six) AM.   citalopram (CELEXA) 10 MG tablet Take 10 mg by mouth daily.   HYDROcodone-acetaminophen (NORCO/VICODIN)  5-325 MG tablet Take 1 tablet by mouth every 4 (four) hours as needed for moderate pain.   lactose free nutrition (BOOST) LIQD Take 237 mLs by mouth daily.   In the afternoon   latanoprost (XALATAN) 0.005 % ophthalmic solution Place 1 drop into both eyes at bedtime.   Multiple Vitamin (MULTIVITAMIN) tablet Take 1 tablet by mouth daily.   ondansetron (ZOFRAN) 4 MG tablet Take 1 tablet (4 mg total) by mouth every 6 (six) hours as needed for nausea.   senna (SENOKOT) 8.6 MG TABS tablet Take 1 tablet (8.6 mg total) by mouth daily as needed for mild constipation.   zolpidem (AMBIEN) 5 MG tablet Take 5 mg by mouth daily.   [DISCONTINUED] naproxen (NAPROSYN) 500 MG tablet Take 500 mg by mouth as needed.   No facility-administered encounter medications on file as of 03/31/2023.    Review of Systems  Constitutional:  Negative for appetite change, chills, fatigue and fever.  HENT:  Negative for congestion, hearing loss, rhinorrhea and sore throat.   Eyes: Negative.   Respiratory:  Negative for cough, shortness of breath and wheezing.   Cardiovascular:  Negative for chest pain, palpitations and leg swelling.  Gastrointestinal:  Negative for abdominal pain, constipation, diarrhea, nausea and vomiting.  Genitourinary:  Negative for dysuria.  Musculoskeletal:  Negative for arthralgias, back pain and myalgias.  Skin:  Negative for color change, rash and wound.  Neurological:  Negative for dizziness, weakness and headaches.  Psychiatric/Behavioral:  Negative for behavioral problems. The patient is not nervous/anxious.     Vitals:   03/31/23 0957  BP: 122/80  Pulse: 77  Resp: 16  Temp: 98.7 F (37.1 C)  SpO2: 96%  Weight: 98 lb 9 oz (44.7 kg)  Height: 5\' 2"  (1.575 m)   Body mass index is 18.03 kg/m. Physical Exam Constitutional:      Appearance: Normal appearance.  HENT:     Head: Normocephalic and atraumatic.     Nose: Nose normal.     Mouth/Throat:     Mouth: Mucous membranes are moist.  Eyes:     Conjunctiva/sclera: Conjunctivae normal.  Cardiovascular:     Rate and Rhythm: Normal rate and regular rhythm.  Pulmonary:     Effort:  Pulmonary effort is normal.     Breath sounds: Normal breath sounds.  Abdominal:     General: Bowel sounds are normal.     Palpations: Abdomen is soft.  Musculoskeletal:        General: Normal range of motion.     Cervical back: Normal range of motion.  Skin:    General: Skin is warm and dry.  Neurological:     General: No focal deficit present.     Mental Status: She is alert and oriented to person, place, and time.  Psychiatric:        Mood and Affect: Mood normal.        Behavior: Behavior normal.        Thought Content: Thought content normal.        Judgment: Judgment normal.     Labs reviewed: Basic Metabolic Panel: Recent Labs    02/24/23 0120  NA 136  K 4.1  CL 101  CO2 24  GLUCOSE 160*  BUN 16  CREATININE 0.80  CALCIUM 8.7*   Liver Function Tests: No results for input(s): "AST", "ALT", "ALKPHOS", "BILITOT", "PROT", "ALBUMIN" in the last 8760 hours. No results for input(s): "LIPASE", "AMYLASE" in the last 8760 hours. No results for input(s): "  AMMONIA" in the last 8760 hours. CBC: Recent Labs    02/24/23 0120  WBC 13.6*  NEUTROABS 11.9*  HGB 10.4*  HCT 32.1*  MCV 98.2  PLT 318   Cardiac Enzymes: No results for input(s): "CKTOTAL", "CKMB", "CKMBINDEX", "TROPONINI" in the last 8760 hours. BNP: Invalid input(s): "POCBNP" CBG: No results for input(s): "GLUCAP" in the last 8760 hours.  Procedures and Imaging Studies During Stay: No results found.  Assessment/Plan:    1. 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  Dispense: 8 tablet; Refill: 0 -  fall precautions  2. Anemia, unspecified type Lab Results  Component Value Date   HGB 10.4 (L)  02/24/2023   -  stable  3. Insomnia, unspecified type -  continue Ambien  4. Pneumonia of left lower lobe due to infectious organism -  completed antibiotics for 5 days while in the hospital       Future labs/tests needed:  None

## 2023-04-02 MED ORDER — HYDROCODONE-ACETAMINOPHEN 5-325 MG PO TABS: 1.0000 | ORAL_TABLET | Freq: Three times a day (TID) | ORAL | 0 refills | Status: DC | PRN

## 2023-04-03 DIAGNOSIS — M9701XD Periprosthetic fracture around internal prosthetic right hip joint, subsequent encounter: Secondary | ICD-10-CM | POA: Diagnosis not present

## 2023-04-03 DIAGNOSIS — M25551 Pain in right hip: Secondary | ICD-10-CM | POA: Diagnosis not present

## 2023-04-04 ENCOUNTER — Non-Acute Institutional Stay (SKILLED_NURSING_FACILITY): Payer: Medicare HMO | Admitting: Adult Health

## 2023-04-04 ENCOUNTER — Encounter: Payer: Self-pay | Admitting: Adult Health

## 2023-04-04 DIAGNOSIS — M6281 Muscle weakness (generalized): Secondary | ICD-10-CM | POA: Diagnosis not present

## 2023-04-04 DIAGNOSIS — Z9181 History of falling: Secondary | ICD-10-CM | POA: Diagnosis not present

## 2023-04-04 DIAGNOSIS — R41841 Cognitive communication deficit: Secondary | ICD-10-CM | POA: Diagnosis not present

## 2023-04-04 DIAGNOSIS — M9701XD Periprosthetic fracture around internal prosthetic right hip joint, subsequent encounter: Secondary | ICD-10-CM | POA: Diagnosis not present

## 2023-04-04 DIAGNOSIS — R2681 Unsteadiness on feet: Secondary | ICD-10-CM | POA: Diagnosis not present

## 2023-04-04 DIAGNOSIS — M978XXS Periprosthetic fracture around other internal prosthetic joint, sequela: Secondary | ICD-10-CM

## 2023-04-04 DIAGNOSIS — G4700 Insomnia, unspecified: Secondary | ICD-10-CM

## 2023-04-04 DIAGNOSIS — Z96649 Presence of unspecified artificial hip joint: Secondary | ICD-10-CM | POA: Diagnosis not present

## 2023-04-04 DIAGNOSIS — F339 Major depressive disorder, recurrent, unspecified: Secondary | ICD-10-CM

## 2023-04-04 NOTE — Progress Notes (Signed)
Location:  [facility] Room Number: 03A Place of Service:  SNF [phone])  Provider: Margit Banda. Grayce Sessions, NP   PCP: Mahlon Gammon, MD Patient Care Team: Mahlon Gammon, MD as PCP - General (Internal Medicine) Odette Fraction, MD (Inactive) as Consulting Physician (Anesthesiology) Rene Paci, MD as Consulting Physician (Urology)  Extended Emergency Contact Information Primary Emergency Contact: Shamecca, [place] Mobile Phone: [phone] Relation: Daughter Secondary Emergency Contact: Ulyses Jarred [place] Mobile Phone: [phone] Relation: Daughter  Code Status: DNR Goals of care:  Advanced Directive information    04/04/2023    9:21 AM  Advanced Directives  Does Patient Have a Medical Advance Directive? Yes  Type of Advance Directive Out of facility DNR (pink MOST or yellow form)  Does patient want to make changes to medical advance directive? No - Patient declined  Pre-existing out of facility DNR order (yellow form or pink MOST form) Yellow form placed in chart (order not valid for inpatient use)     No Known Allergies  Chief Complaint  Patient presents with   Acute Visit    [facility] For discharge  back to her independent apartment     HPI:  This is a 89 y.o. female who is for discharge back to her independent living apartment.   She was admitted to [facility] on 03/31/23 from [facility] for continued short-term rehabilitation. She was hospitalized 02/24/23 to 03/03/23 post fall at her bathroom apartment for which she sustained a right periprosthetic fracture. Orthopedics was consulted and felt that it is best managed by non operative management with 50% weight-bearing. Incidentally, she was noted to have LLL infiltrate and was treated/completed 5 days of antibiotics.  She followed up with orthopedics on 04/03/23 and X-rays done showed that fracture is  stable with routine healing. She is now weight-bearing as tolerated and was cleared to return to her independent living apartment. She will follow up with orthopedics in 4 weeks.  Past Medical History:  Diagnosis Date   Arthritis    Back pain    Chronic kidney disease, stage 3a    Insomnia    Leg pain    Neck pain     History reviewed. No pertinent surgical history.    reports that she has never smoked. She has never used smokeless tobacco. She reports current alcohol use of about 14.0 standard drinks of alcohol per week. She reports that she does not use drugs. Social History   Socioeconomic History   Marital status: Widowed    Spouse name: Not on file   Number of children: Not on file   Years of education: Not on file   Highest education level: Not on file  Occupational History   Not on file  Tobacco Use   Smoking status: Never   Smokeless tobacco: Never  Vaping Use   Vaping Use: Never used  Substance and Sexual Activity   Alcohol use: Yes    Alcohol/week: 14.0 standard drinks of alcohol    Types: 14 Standard drinks or equivalent per week   Drug use: Never   Sexual activity: Not on file  Other Topics Concern   Not on file  Social History Narrative   Social History      Diet? light      Do you drink/eat things with caffeine? yes      Marital status?  Widowed                      What year were you married? 1946      Do you live in a house, apartment, assisted living, condo, trailer, etc.? Apartment retirement community       Is it one or more stories? yes      How many persons live in your home? 1      Do you have any pets in your home? (please list) no       Highest level of education completed? 2 years college       Current or past profession:      Do you exercise?         yes                             Type & how often? Water aerobics       Advanced Directives      Do you have a living will? yes      Do you have a DNR form?                   yes                If not, do you want to discuss one? yes      Do you have signed POA/HPOA for forms? yes      Functional Status      Do you have difficulty bathing or dressing yourself? No       Do you have difficulty preparing food or eating? No       Do you have difficulty managing your medications? No       Do you have difficulty managing your finances? No       Do you have difficulty affording your medications? No       Social Determinants of Health   Financial Resource Strain: Not on file  Food Insecurity: No Food Insecurity (02/24/2023)   Hunger Vital Sign    Worried About Running Out of Food in the Last Year: Never true    Ran Out of Food in the Last Year: Never true  Transportation Needs: No Transportation Needs (02/24/2023)   PRAPARE - Administrator, Civil Service (Medical): No    Lack of Transportation (Non-Medical): No  Physical Activity: Not on file  Stress: Not on file  Social Connections: Not on file  Intimate Partner Violence: Not At Risk (02/24/2023)   Humiliation, Afraid, Rape, and Kick questionnaire    Fear of Current or Ex-Partner: No    Emotionally Abused: No    Physically Abused: No    Sexually Abused: No   Functional Status Survey:    No Known Allergies  Pertinent  Health Maintenance Due  Topic Date Due   DEXA SCAN  Never done   INFLUENZA VACCINE  07/13/2023    Medications: Outpatient Encounter Medications as of 04/04/2023  Medication Sig   acetaminophen (TYLENOL) 500 MG tablet Take 500 mg by mouth every 8 (eight) hours as needed for moderate pain.   AZO CRANBERRY GUMMIES PO Take by mouth. 2 each day   calcium carbonate (OSCAL) 1500 (600 Ca) MG TABS tablet Take 1,500 mg by mouth daily at 6 (six) AM.   citalopram (CELEXA) 10 MG tablet Take 10 mg by mouth daily.   HYDROcodone-acetaminophen (NORCO/VICODIN) 5-325 MG tablet Take 1 tablet  by mouth every 8 (eight) hours as needed for moderate pain.   lactose free nutrition  (BOOST) LIQD Take 237 mLs by mouth daily.  In the afternoon   latanoprost (XALATAN) 0.005 % ophthalmic solution Place 1 drop into both eyes at bedtime.   Multiple Vitamin (MULTIVITAMIN) tablet Take 1 tablet by mouth daily.   ondansetron (ZOFRAN) 4 MG tablet Take 1 tablet (4 mg total) by mouth every 6 (six) hours as needed for nausea.   Ostomy Supplies (SKIN PREP WIPES) MISC Apply topically. Apply to Heels topically every day and evening shift for Skin care   senna (SENOKOT) 8.6 MG TABS tablet Take 1 tablet (8.6 mg total) by mouth daily as needed for mild constipation.   Zinc Oxide 10 % OINT Apply topically as needed.   zolpidem (AMBIEN) 5 MG tablet Take 5 mg by mouth daily.   No facility-administered encounter medications on file as of 04/04/2023.    Review of Systems  Constitutional:  Negative for appetite change, chills, fatigue and fever.  HENT:  Negative for congestion, hearing loss, rhinorrhea and sore throat.   Eyes: Negative.   Respiratory:  Negative for cough, shortness of breath and wheezing.   Cardiovascular:  Negative for chest pain, palpitations and leg swelling.  Gastrointestinal:  Negative for abdominal pain, constipation, diarrhea, nausea and vomiting.  Genitourinary:  Negative for dysuria.  Musculoskeletal:  Negative for arthralgias, back pain and myalgias.  Skin:  Negative for color change, rash and wound.  Neurological:  Negative for dizziness, weakness and headaches.  Psychiatric/Behavioral:  Negative for behavioral problems. The patient is not nervous/anxious.     Vitals:   04/04/23 0931  BP: 108/60  Pulse: 86  Resp: 18  Temp: (!) 97.4 F (36.3 C)  SpO2: 96%  Weight: 96 lb (43.5 kg)  Height:  (1.575 m)   Body mass index is 17.56 kg/m. Physical Exam Constitutional:      Appearance: Normal appearance.  HENT:     Head: Normocephalic and atraumatic.     Nose: Nose normal.     Mouth/Throat:     Mouth: Mucous membranes are moist.  Eyes:      Conjunctiva/sclera: Conjunctivae normal.  Cardiovascular:     Rate and Rhythm: Normal rate and regular rhythm.  Pulmonary:     Effort: Pulmonary effort is normal.     Breath sounds: Normal breath sounds.  Abdominal:     General: Bowel sounds are normal.     Palpations: Abdomen is soft.  Musculoskeletal:        General: Normal range of motion.     Cervical back: Normal range of motion.  Skin:    General: Skin is warm and dry.  Neurological:     General: No focal deficit present.     Mental Status: She is alert and oriented to person, place, and time.  Psychiatric:        Mood and Affect: Mood normal.        Behavior: Behavior normal.        Thought Content: Thought content normal.        Judgment: Judgment normal.     Labs reviewed: Basic Metabolic Panel: Recent Labs    02/24/23 0120  NA 136  K 4.1  CL 101  CO2 24  GLUCOSE 160*  BUN 16  CREATININE 0.80  CALCIUM 8.7*   Liver Function Tests: No results for input(s): "AST", "ALT", "ALKPHOS", "BILITOT", "PROT", "ALBUMIN" in the last 8760 hours. No results for input(s): "  LIPASE", "AMYLASE" in the last 8760 hours. No results for input(s): "AMMONIA" in the last 8760 hours. CBC: Recent Labs    02/24/23 0120  WBC 13.6*  NEUTROABS 11.9*  HGB 10.4*  HCT 32.1*  MCV 98.2  PLT 318   Cardiac Enzymes: No results for input(s): "CKTOTAL", "CKMB", "CKMBINDEX", "TROPONINI" in the last 8760 hours. BNP: Invalid input(s): "POCBNP" CBG: No results for input(s): "GLUCAP" in the last 8760 hours.  Procedures and Imaging Studies During Stay: No results found.  Assessment/Plan:   . 1. 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 Major depression, recurrent, chronic -  denies feeling depressed -  continue Celexa  3.  Insomnia, unspecified type -  continue Ambien    Patient is being discharged back to the independent living apartment and will have PT and OT.    Patient is being discharged with the following durable medical equipment:    Patient has been advised to f/u with their PCP in 1-2 weeks to for a transitions of care visit.  Social services at their facility was responsible for arranging this appointment.     Future labs/tests needed:  None

## 2023-04-05 DIAGNOSIS — M6281 Muscle weakness (generalized): Secondary | ICD-10-CM | POA: Diagnosis not present

## 2023-04-05 DIAGNOSIS — Z9181 History of falling: Secondary | ICD-10-CM | POA: Diagnosis not present

## 2023-04-05 DIAGNOSIS — M9701XD Periprosthetic fracture around internal prosthetic right hip joint, subsequent encounter: Secondary | ICD-10-CM | POA: Diagnosis not present

## 2023-04-05 DIAGNOSIS — R2681 Unsteadiness on feet: Secondary | ICD-10-CM | POA: Diagnosis not present

## 2023-04-05 DIAGNOSIS — R41841 Cognitive communication deficit: Secondary | ICD-10-CM | POA: Diagnosis not present

## 2023-04-06 DIAGNOSIS — R2681 Unsteadiness on feet: Secondary | ICD-10-CM | POA: Diagnosis not present

## 2023-04-06 DIAGNOSIS — M6281 Muscle weakness (generalized): Secondary | ICD-10-CM | POA: Diagnosis not present

## 2023-04-06 DIAGNOSIS — M9701XD Periprosthetic fracture around internal prosthetic right hip joint, subsequent encounter: Secondary | ICD-10-CM | POA: Diagnosis not present

## 2023-04-06 DIAGNOSIS — R41841 Cognitive communication deficit: Secondary | ICD-10-CM | POA: Diagnosis not present

## 2023-04-06 DIAGNOSIS — Z9181 History of falling: Secondary | ICD-10-CM | POA: Diagnosis not present

## 2023-04-06 DIAGNOSIS — M5412 Radiculopathy, cervical region: Secondary | ICD-10-CM | POA: Diagnosis not present

## 2023-04-07 DIAGNOSIS — M6281 Muscle weakness (generalized): Secondary | ICD-10-CM | POA: Diagnosis not present

## 2023-04-07 DIAGNOSIS — M9701XD Periprosthetic fracture around internal prosthetic right hip joint, subsequent encounter: Secondary | ICD-10-CM | POA: Diagnosis not present

## 2023-04-07 DIAGNOSIS — R41841 Cognitive communication deficit: Secondary | ICD-10-CM | POA: Diagnosis not present

## 2023-04-07 DIAGNOSIS — Z9181 History of falling: Secondary | ICD-10-CM | POA: Diagnosis not present

## 2023-04-07 DIAGNOSIS — R2681 Unsteadiness on feet: Secondary | ICD-10-CM | POA: Diagnosis not present

## 2023-04-10 DIAGNOSIS — M6281 Muscle weakness (generalized): Secondary | ICD-10-CM | POA: Diagnosis not present

## 2023-04-10 DIAGNOSIS — Z9181 History of falling: Secondary | ICD-10-CM | POA: Diagnosis not present

## 2023-04-10 DIAGNOSIS — M9701XD Periprosthetic fracture around internal prosthetic right hip joint, subsequent encounter: Secondary | ICD-10-CM | POA: Diagnosis not present

## 2023-04-10 DIAGNOSIS — R2681 Unsteadiness on feet: Secondary | ICD-10-CM | POA: Diagnosis not present

## 2023-04-10 DIAGNOSIS — R41841 Cognitive communication deficit: Secondary | ICD-10-CM | POA: Diagnosis not present

## 2023-04-12 DIAGNOSIS — Z9181 History of falling: Secondary | ICD-10-CM | POA: Diagnosis not present

## 2023-04-12 DIAGNOSIS — R41841 Cognitive communication deficit: Secondary | ICD-10-CM | POA: Diagnosis not present

## 2023-04-12 DIAGNOSIS — R2681 Unsteadiness on feet: Secondary | ICD-10-CM | POA: Diagnosis not present

## 2023-04-12 DIAGNOSIS — M6281 Muscle weakness (generalized): Secondary | ICD-10-CM | POA: Diagnosis not present

## 2023-04-12 DIAGNOSIS — H9192 Unspecified hearing loss, left ear: Secondary | ICD-10-CM | POA: Diagnosis not present

## 2023-04-12 DIAGNOSIS — H6122 Impacted cerumen, left ear: Secondary | ICD-10-CM | POA: Diagnosis not present

## 2023-04-13 DIAGNOSIS — M6281 Muscle weakness (generalized): Secondary | ICD-10-CM | POA: Diagnosis not present

## 2023-04-13 DIAGNOSIS — M9701XD Periprosthetic fracture around internal prosthetic right hip joint, subsequent encounter: Secondary | ICD-10-CM | POA: Diagnosis not present

## 2023-04-13 DIAGNOSIS — Z9181 History of falling: Secondary | ICD-10-CM | POA: Diagnosis not present

## 2023-04-14 DIAGNOSIS — Z9181 History of falling: Secondary | ICD-10-CM | POA: Diagnosis not present

## 2023-04-14 DIAGNOSIS — M6281 Muscle weakness (generalized): Secondary | ICD-10-CM | POA: Diagnosis not present

## 2023-04-14 DIAGNOSIS — R41841 Cognitive communication deficit: Secondary | ICD-10-CM | POA: Diagnosis not present

## 2023-04-14 DIAGNOSIS — H9192 Unspecified hearing loss, left ear: Secondary | ICD-10-CM | POA: Diagnosis not present

## 2023-04-14 DIAGNOSIS — R2681 Unsteadiness on feet: Secondary | ICD-10-CM | POA: Diagnosis not present

## 2023-04-17 DIAGNOSIS — Z9181 History of falling: Secondary | ICD-10-CM | POA: Diagnosis not present

## 2023-04-17 DIAGNOSIS — M6281 Muscle weakness (generalized): Secondary | ICD-10-CM | POA: Diagnosis not present

## 2023-04-17 DIAGNOSIS — R41841 Cognitive communication deficit: Secondary | ICD-10-CM | POA: Diagnosis not present

## 2023-04-17 DIAGNOSIS — R2681 Unsteadiness on feet: Secondary | ICD-10-CM | POA: Diagnosis not present

## 2023-04-18 DIAGNOSIS — R41841 Cognitive communication deficit: Secondary | ICD-10-CM | POA: Diagnosis not present

## 2023-04-18 DIAGNOSIS — M9701XD Periprosthetic fracture around internal prosthetic right hip joint, subsequent encounter: Secondary | ICD-10-CM | POA: Diagnosis not present

## 2023-04-18 DIAGNOSIS — Z9181 History of falling: Secondary | ICD-10-CM | POA: Diagnosis not present

## 2023-04-18 DIAGNOSIS — M6281 Muscle weakness (generalized): Secondary | ICD-10-CM | POA: Diagnosis not present

## 2023-04-18 DIAGNOSIS — H9192 Unspecified hearing loss, left ear: Secondary | ICD-10-CM | POA: Diagnosis not present

## 2023-04-19 DIAGNOSIS — R41841 Cognitive communication deficit: Secondary | ICD-10-CM | POA: Diagnosis not present

## 2023-04-19 DIAGNOSIS — R2681 Unsteadiness on feet: Secondary | ICD-10-CM | POA: Diagnosis not present

## 2023-04-19 DIAGNOSIS — H9192 Unspecified hearing loss, left ear: Secondary | ICD-10-CM | POA: Diagnosis not present

## 2023-04-19 DIAGNOSIS — Z9181 History of falling: Secondary | ICD-10-CM | POA: Diagnosis not present

## 2023-04-19 DIAGNOSIS — M6281 Muscle weakness (generalized): Secondary | ICD-10-CM | POA: Diagnosis not present

## 2023-04-20 DIAGNOSIS — M9701XD Periprosthetic fracture around internal prosthetic right hip joint, subsequent encounter: Secondary | ICD-10-CM | POA: Diagnosis not present

## 2023-04-20 DIAGNOSIS — H9192 Unspecified hearing loss, left ear: Secondary | ICD-10-CM | POA: Diagnosis not present

## 2023-04-20 DIAGNOSIS — R41841 Cognitive communication deficit: Secondary | ICD-10-CM | POA: Diagnosis not present

## 2023-04-20 DIAGNOSIS — Z9181 History of falling: Secondary | ICD-10-CM | POA: Diagnosis not present

## 2023-04-20 DIAGNOSIS — M6281 Muscle weakness (generalized): Secondary | ICD-10-CM | POA: Diagnosis not present

## 2023-04-21 DIAGNOSIS — R41841 Cognitive communication deficit: Secondary | ICD-10-CM | POA: Diagnosis not present

## 2023-04-21 DIAGNOSIS — M6281 Muscle weakness (generalized): Secondary | ICD-10-CM | POA: Diagnosis not present

## 2023-04-21 DIAGNOSIS — R2681 Unsteadiness on feet: Secondary | ICD-10-CM | POA: Diagnosis not present

## 2023-04-21 DIAGNOSIS — Z9181 History of falling: Secondary | ICD-10-CM | POA: Diagnosis not present

## 2023-04-24 DIAGNOSIS — R41841 Cognitive communication deficit: Secondary | ICD-10-CM | POA: Diagnosis not present

## 2023-04-24 DIAGNOSIS — Z9181 History of falling: Secondary | ICD-10-CM | POA: Diagnosis not present

## 2023-04-24 DIAGNOSIS — M6281 Muscle weakness (generalized): Secondary | ICD-10-CM | POA: Diagnosis not present

## 2023-04-24 DIAGNOSIS — R2681 Unsteadiness on feet: Secondary | ICD-10-CM | POA: Diagnosis not present

## 2023-04-25 DIAGNOSIS — Z9181 History of falling: Secondary | ICD-10-CM | POA: Diagnosis not present

## 2023-04-25 DIAGNOSIS — H9192 Unspecified hearing loss, left ear: Secondary | ICD-10-CM | POA: Diagnosis not present

## 2023-04-25 DIAGNOSIS — M9701XD Periprosthetic fracture around internal prosthetic right hip joint, subsequent encounter: Secondary | ICD-10-CM | POA: Diagnosis not present

## 2023-04-25 DIAGNOSIS — M6281 Muscle weakness (generalized): Secondary | ICD-10-CM | POA: Diagnosis not present

## 2023-04-25 DIAGNOSIS — R41841 Cognitive communication deficit: Secondary | ICD-10-CM | POA: Diagnosis not present

## 2023-04-26 DIAGNOSIS — R41841 Cognitive communication deficit: Secondary | ICD-10-CM | POA: Diagnosis not present

## 2023-04-26 DIAGNOSIS — M6281 Muscle weakness (generalized): Secondary | ICD-10-CM | POA: Diagnosis not present

## 2023-04-26 DIAGNOSIS — Z9181 History of falling: Secondary | ICD-10-CM | POA: Diagnosis not present

## 2023-04-26 DIAGNOSIS — H9192 Unspecified hearing loss, left ear: Secondary | ICD-10-CM | POA: Diagnosis not present

## 2023-04-26 DIAGNOSIS — R2681 Unsteadiness on feet: Secondary | ICD-10-CM | POA: Diagnosis not present

## 2023-04-27 DIAGNOSIS — M6281 Muscle weakness (generalized): Secondary | ICD-10-CM | POA: Diagnosis not present

## 2023-04-27 DIAGNOSIS — M9701XD Periprosthetic fracture around internal prosthetic right hip joint, subsequent encounter: Secondary | ICD-10-CM | POA: Diagnosis not present

## 2023-04-27 DIAGNOSIS — Z9181 History of falling: Secondary | ICD-10-CM | POA: Diagnosis not present

## 2023-04-28 DIAGNOSIS — R41841 Cognitive communication deficit: Secondary | ICD-10-CM | POA: Diagnosis not present

## 2023-04-28 DIAGNOSIS — Z9181 History of falling: Secondary | ICD-10-CM | POA: Diagnosis not present

## 2023-04-28 DIAGNOSIS — M6281 Muscle weakness (generalized): Secondary | ICD-10-CM | POA: Diagnosis not present

## 2023-04-28 DIAGNOSIS — R2681 Unsteadiness on feet: Secondary | ICD-10-CM | POA: Diagnosis not present

## 2023-05-01 DIAGNOSIS — R2681 Unsteadiness on feet: Secondary | ICD-10-CM | POA: Diagnosis not present

## 2023-05-01 DIAGNOSIS — Z9181 History of falling: Secondary | ICD-10-CM | POA: Diagnosis not present

## 2023-05-01 DIAGNOSIS — R41841 Cognitive communication deficit: Secondary | ICD-10-CM | POA: Diagnosis not present

## 2023-05-01 DIAGNOSIS — M6281 Muscle weakness (generalized): Secondary | ICD-10-CM | POA: Diagnosis not present

## 2023-05-02 DIAGNOSIS — N1831 Chronic kidney disease, stage 3a: Secondary | ICD-10-CM | POA: Diagnosis not present

## 2023-05-02 DIAGNOSIS — R946 Abnormal results of thyroid function studies: Secondary | ICD-10-CM | POA: Diagnosis not present

## 2023-05-02 DIAGNOSIS — F5104 Psychophysiologic insomnia: Secondary | ICD-10-CM | POA: Diagnosis not present

## 2023-05-02 DIAGNOSIS — F418 Other specified anxiety disorders: Secondary | ICD-10-CM | POA: Diagnosis not present

## 2023-05-02 DIAGNOSIS — K635 Polyp of colon: Secondary | ICD-10-CM | POA: Diagnosis not present

## 2023-05-02 DIAGNOSIS — I6523 Occlusion and stenosis of bilateral carotid arteries: Secondary | ICD-10-CM | POA: Diagnosis not present

## 2023-05-02 DIAGNOSIS — R7301 Impaired fasting glucose: Secondary | ICD-10-CM | POA: Diagnosis not present

## 2023-05-02 DIAGNOSIS — H353131 Nonexudative age-related macular degeneration, bilateral, early dry stage: Secondary | ICD-10-CM | POA: Diagnosis not present

## 2023-05-03 DIAGNOSIS — Z9181 History of falling: Secondary | ICD-10-CM | POA: Diagnosis not present

## 2023-05-03 DIAGNOSIS — M9701XD Periprosthetic fracture around internal prosthetic right hip joint, subsequent encounter: Secondary | ICD-10-CM | POA: Diagnosis not present

## 2023-05-03 DIAGNOSIS — M6281 Muscle weakness (generalized): Secondary | ICD-10-CM | POA: Diagnosis not present

## 2023-05-04 DIAGNOSIS — Z9181 History of falling: Secondary | ICD-10-CM | POA: Diagnosis not present

## 2023-05-04 DIAGNOSIS — M6281 Muscle weakness (generalized): Secondary | ICD-10-CM | POA: Diagnosis not present

## 2023-05-04 DIAGNOSIS — M9701XD Periprosthetic fracture around internal prosthetic right hip joint, subsequent encounter: Secondary | ICD-10-CM | POA: Diagnosis not present

## 2023-05-05 DIAGNOSIS — Z9181 History of falling: Secondary | ICD-10-CM | POA: Diagnosis not present

## 2023-05-05 DIAGNOSIS — R41841 Cognitive communication deficit: Secondary | ICD-10-CM | POA: Diagnosis not present

## 2023-05-05 DIAGNOSIS — R2681 Unsteadiness on feet: Secondary | ICD-10-CM | POA: Diagnosis not present

## 2023-05-05 DIAGNOSIS — M6281 Muscle weakness (generalized): Secondary | ICD-10-CM | POA: Diagnosis not present

## 2023-05-09 DIAGNOSIS — R2681 Unsteadiness on feet: Secondary | ICD-10-CM | POA: Diagnosis not present

## 2023-05-09 DIAGNOSIS — Z9181 History of falling: Secondary | ICD-10-CM | POA: Diagnosis not present

## 2023-05-09 DIAGNOSIS — M9701XD Periprosthetic fracture around internal prosthetic right hip joint, subsequent encounter: Secondary | ICD-10-CM | POA: Diagnosis not present

## 2023-05-09 DIAGNOSIS — M6281 Muscle weakness (generalized): Secondary | ICD-10-CM | POA: Diagnosis not present

## 2023-05-09 DIAGNOSIS — R41841 Cognitive communication deficit: Secondary | ICD-10-CM | POA: Diagnosis not present

## 2023-05-10 DIAGNOSIS — M6281 Muscle weakness (generalized): Secondary | ICD-10-CM | POA: Diagnosis not present

## 2023-05-10 DIAGNOSIS — M9701XD Periprosthetic fracture around internal prosthetic right hip joint, subsequent encounter: Secondary | ICD-10-CM | POA: Diagnosis not present

## 2023-05-10 DIAGNOSIS — Z9181 History of falling: Secondary | ICD-10-CM | POA: Diagnosis not present

## 2023-05-11 DIAGNOSIS — M5412 Radiculopathy, cervical region: Secondary | ICD-10-CM | POA: Diagnosis not present

## 2023-05-11 DIAGNOSIS — F1120 Opioid dependence, uncomplicated: Secondary | ICD-10-CM | POA: Diagnosis not present

## 2023-05-11 DIAGNOSIS — M5030 Other cervical disc degeneration, unspecified cervical region: Secondary | ICD-10-CM | POA: Diagnosis not present

## 2023-05-11 DIAGNOSIS — M48061 Spinal stenosis, lumbar region without neurogenic claudication: Secondary | ICD-10-CM | POA: Diagnosis not present

## 2023-05-12 DIAGNOSIS — Z9181 History of falling: Secondary | ICD-10-CM | POA: Diagnosis not present

## 2023-05-12 DIAGNOSIS — R41841 Cognitive communication deficit: Secondary | ICD-10-CM | POA: Diagnosis not present

## 2023-05-12 DIAGNOSIS — M6281 Muscle weakness (generalized): Secondary | ICD-10-CM | POA: Diagnosis not present

## 2023-05-12 DIAGNOSIS — R2681 Unsteadiness on feet: Secondary | ICD-10-CM | POA: Diagnosis not present

## 2023-05-17 DIAGNOSIS — R41841 Cognitive communication deficit: Secondary | ICD-10-CM | POA: Diagnosis not present

## 2023-05-17 DIAGNOSIS — Z9181 History of falling: Secondary | ICD-10-CM | POA: Diagnosis not present

## 2023-05-17 DIAGNOSIS — M6281 Muscle weakness (generalized): Secondary | ICD-10-CM | POA: Diagnosis not present

## 2023-05-17 DIAGNOSIS — R2681 Unsteadiness on feet: Secondary | ICD-10-CM | POA: Diagnosis not present

## 2023-05-18 DIAGNOSIS — R2681 Unsteadiness on feet: Secondary | ICD-10-CM | POA: Diagnosis not present

## 2023-05-18 DIAGNOSIS — R41841 Cognitive communication deficit: Secondary | ICD-10-CM | POA: Diagnosis not present

## 2023-05-18 DIAGNOSIS — M6281 Muscle weakness (generalized): Secondary | ICD-10-CM | POA: Diagnosis not present

## 2023-05-18 DIAGNOSIS — Z9181 History of falling: Secondary | ICD-10-CM | POA: Diagnosis not present

## 2023-07-14 DIAGNOSIS — M5412 Radiculopathy, cervical region: Secondary | ICD-10-CM | POA: Diagnosis not present

## 2023-07-21 ENCOUNTER — Encounter: Payer: Self-pay | Admitting: Family Medicine

## 2023-07-24 ENCOUNTER — Encounter: Payer: Self-pay | Admitting: Family Medicine

## 2023-08-11 DIAGNOSIS — F325 Major depressive disorder, single episode, in full remission: Secondary | ICD-10-CM | POA: Diagnosis not present

## 2023-08-11 DIAGNOSIS — Z87440 Personal history of urinary (tract) infections: Secondary | ICD-10-CM | POA: Diagnosis not present

## 2023-08-11 DIAGNOSIS — Z833 Family history of diabetes mellitus: Secondary | ICD-10-CM | POA: Diagnosis not present

## 2023-08-11 DIAGNOSIS — H409 Unspecified glaucoma: Secondary | ICD-10-CM | POA: Diagnosis not present

## 2023-08-11 DIAGNOSIS — Z9181 History of falling: Secondary | ICD-10-CM | POA: Diagnosis not present

## 2023-08-11 DIAGNOSIS — F1120 Opioid dependence, uncomplicated: Secondary | ICD-10-CM | POA: Diagnosis not present

## 2023-08-11 DIAGNOSIS — R2681 Unsteadiness on feet: Secondary | ICD-10-CM | POA: Diagnosis not present

## 2023-08-11 DIAGNOSIS — G4700 Insomnia, unspecified: Secondary | ICD-10-CM | POA: Diagnosis not present

## 2023-08-23 DIAGNOSIS — Z961 Presence of intraocular lens: Secondary | ICD-10-CM | POA: Diagnosis not present

## 2023-08-23 DIAGNOSIS — H401131 Primary open-angle glaucoma, bilateral, mild stage: Secondary | ICD-10-CM | POA: Diagnosis not present

## 2023-08-29 DIAGNOSIS — M48061 Spinal stenosis, lumbar region without neurogenic claudication: Secondary | ICD-10-CM | POA: Diagnosis not present

## 2023-08-29 DIAGNOSIS — M5030 Other cervical disc degeneration, unspecified cervical region: Secondary | ICD-10-CM | POA: Diagnosis not present

## 2023-08-29 DIAGNOSIS — F1120 Opioid dependence, uncomplicated: Secondary | ICD-10-CM | POA: Diagnosis not present

## 2023-09-20 DIAGNOSIS — H903 Sensorineural hearing loss, bilateral: Secondary | ICD-10-CM | POA: Diagnosis not present

## 2023-10-10 DIAGNOSIS — H409 Unspecified glaucoma: Secondary | ICD-10-CM | POA: Diagnosis not present

## 2023-10-10 DIAGNOSIS — L989 Disorder of the skin and subcutaneous tissue, unspecified: Secondary | ICD-10-CM | POA: Diagnosis not present

## 2023-10-10 DIAGNOSIS — F5104 Psychophysiologic insomnia: Secondary | ICD-10-CM | POA: Diagnosis not present

## 2023-10-10 DIAGNOSIS — I6523 Occlusion and stenosis of bilateral carotid arteries: Secondary | ICD-10-CM | POA: Diagnosis not present

## 2023-10-10 DIAGNOSIS — K219 Gastro-esophageal reflux disease without esophagitis: Secondary | ICD-10-CM | POA: Diagnosis not present

## 2023-10-10 DIAGNOSIS — R7301 Impaired fasting glucose: Secondary | ICD-10-CM | POA: Diagnosis not present

## 2023-10-10 DIAGNOSIS — N1831 Chronic kidney disease, stage 3a: Secondary | ICD-10-CM | POA: Diagnosis not present

## 2023-10-10 DIAGNOSIS — E785 Hyperlipidemia, unspecified: Secondary | ICD-10-CM | POA: Diagnosis not present

## 2023-10-10 DIAGNOSIS — H353131 Nonexudative age-related macular degeneration, bilateral, early dry stage: Secondary | ICD-10-CM | POA: Diagnosis not present

## 2023-10-10 DIAGNOSIS — F418 Other specified anxiety disorders: Secondary | ICD-10-CM | POA: Diagnosis not present

## 2023-12-26 DIAGNOSIS — M5412 Radiculopathy, cervical region: Secondary | ICD-10-CM | POA: Diagnosis not present

## 2023-12-26 DIAGNOSIS — M5030 Other cervical disc degeneration, unspecified cervical region: Secondary | ICD-10-CM | POA: Diagnosis not present

## 2023-12-26 DIAGNOSIS — F1120 Opioid dependence, uncomplicated: Secondary | ICD-10-CM | POA: Diagnosis not present

## 2024-01-24 DIAGNOSIS — M1990 Unspecified osteoarthritis, unspecified site: Secondary | ICD-10-CM | POA: Diagnosis not present

## 2024-01-24 DIAGNOSIS — M4800 Spinal stenosis, site unspecified: Secondary | ICD-10-CM | POA: Diagnosis not present

## 2024-01-24 DIAGNOSIS — G4700 Insomnia, unspecified: Secondary | ICD-10-CM | POA: Diagnosis not present

## 2024-01-24 DIAGNOSIS — F1120 Opioid dependence, uncomplicated: Secondary | ICD-10-CM | POA: Diagnosis not present

## 2024-01-24 DIAGNOSIS — R030 Elevated blood-pressure reading, without diagnosis of hypertension: Secondary | ICD-10-CM | POA: Diagnosis not present

## 2024-01-24 DIAGNOSIS — Z833 Family history of diabetes mellitus: Secondary | ICD-10-CM | POA: Diagnosis not present

## 2024-01-24 DIAGNOSIS — R32 Unspecified urinary incontinence: Secondary | ICD-10-CM | POA: Diagnosis not present

## 2024-01-24 DIAGNOSIS — Z9181 History of falling: Secondary | ICD-10-CM | POA: Diagnosis not present

## 2024-01-24 DIAGNOSIS — I951 Orthostatic hypotension: Secondary | ICD-10-CM | POA: Diagnosis not present

## 2024-01-24 DIAGNOSIS — E785 Hyperlipidemia, unspecified: Secondary | ICD-10-CM | POA: Diagnosis not present

## 2024-01-24 DIAGNOSIS — F324 Major depressive disorder, single episode, in partial remission: Secondary | ICD-10-CM | POA: Diagnosis not present

## 2024-01-24 DIAGNOSIS — F419 Anxiety disorder, unspecified: Secondary | ICD-10-CM | POA: Diagnosis not present

## 2024-02-06 DIAGNOSIS — E785 Hyperlipidemia, unspecified: Secondary | ICD-10-CM | POA: Diagnosis not present

## 2024-02-06 DIAGNOSIS — M5412 Radiculopathy, cervical region: Secondary | ICD-10-CM | POA: Diagnosis not present

## 2024-02-06 DIAGNOSIS — L989 Disorder of the skin and subcutaneous tissue, unspecified: Secondary | ICD-10-CM | POA: Diagnosis not present

## 2024-02-06 DIAGNOSIS — N1831 Chronic kidney disease, stage 3a: Secondary | ICD-10-CM | POA: Diagnosis not present

## 2024-02-06 DIAGNOSIS — F418 Other specified anxiety disorders: Secondary | ICD-10-CM | POA: Diagnosis not present

## 2024-02-06 DIAGNOSIS — K219 Gastro-esophageal reflux disease without esophagitis: Secondary | ICD-10-CM | POA: Diagnosis not present

## 2024-02-06 DIAGNOSIS — N183 Chronic kidney disease, stage 3 unspecified: Secondary | ICD-10-CM | POA: Diagnosis not present

## 2024-02-06 DIAGNOSIS — H353131 Nonexudative age-related macular degeneration, bilateral, early dry stage: Secondary | ICD-10-CM | POA: Diagnosis not present

## 2024-02-06 DIAGNOSIS — F5104 Psychophysiologic insomnia: Secondary | ICD-10-CM | POA: Diagnosis not present

## 2024-02-06 DIAGNOSIS — I6523 Occlusion and stenosis of bilateral carotid arteries: Secondary | ICD-10-CM | POA: Diagnosis not present

## 2024-02-06 DIAGNOSIS — K635 Polyp of colon: Secondary | ICD-10-CM | POA: Diagnosis not present

## 2024-02-06 DIAGNOSIS — R7301 Impaired fasting glucose: Secondary | ICD-10-CM | POA: Diagnosis not present

## 2024-02-06 DIAGNOSIS — R946 Abnormal results of thyroid function studies: Secondary | ICD-10-CM | POA: Diagnosis not present

## 2024-02-08 DIAGNOSIS — Z85828 Personal history of other malignant neoplasm of skin: Secondary | ICD-10-CM | POA: Diagnosis not present

## 2024-02-08 DIAGNOSIS — L570 Actinic keratosis: Secondary | ICD-10-CM | POA: Diagnosis not present

## 2024-02-08 DIAGNOSIS — C44329 Squamous cell carcinoma of skin of other parts of face: Secondary | ICD-10-CM | POA: Diagnosis not present

## 2024-02-08 DIAGNOSIS — L821 Other seborrheic keratosis: Secondary | ICD-10-CM | POA: Diagnosis not present

## 2024-02-27 DIAGNOSIS — H353131 Nonexudative age-related macular degeneration, bilateral, early dry stage: Secondary | ICD-10-CM | POA: Diagnosis not present

## 2024-02-27 DIAGNOSIS — Z961 Presence of intraocular lens: Secondary | ICD-10-CM | POA: Diagnosis not present

## 2024-02-27 DIAGNOSIS — H401131 Primary open-angle glaucoma, bilateral, mild stage: Secondary | ICD-10-CM | POA: Diagnosis not present

## 2024-03-26 DIAGNOSIS — F1120 Opioid dependence, uncomplicated: Secondary | ICD-10-CM | POA: Diagnosis not present

## 2024-03-26 DIAGNOSIS — M5030 Other cervical disc degeneration, unspecified cervical region: Secondary | ICD-10-CM | POA: Diagnosis not present

## 2024-03-26 DIAGNOSIS — M5416 Radiculopathy, lumbar region: Secondary | ICD-10-CM | POA: Diagnosis not present

## 2024-03-26 DIAGNOSIS — M5412 Radiculopathy, cervical region: Secondary | ICD-10-CM | POA: Diagnosis not present

## 2024-05-08 DIAGNOSIS — L570 Actinic keratosis: Secondary | ICD-10-CM | POA: Diagnosis not present

## 2024-05-08 DIAGNOSIS — L821 Other seborrheic keratosis: Secondary | ICD-10-CM | POA: Diagnosis not present

## 2024-05-08 DIAGNOSIS — Z85828 Personal history of other malignant neoplasm of skin: Secondary | ICD-10-CM | POA: Diagnosis not present

## 2024-07-01 DIAGNOSIS — F1120 Opioid dependence, uncomplicated: Secondary | ICD-10-CM | POA: Diagnosis not present

## 2024-07-01 DIAGNOSIS — M5412 Radiculopathy, cervical region: Secondary | ICD-10-CM | POA: Diagnosis not present

## 2024-07-01 DIAGNOSIS — M5030 Other cervical disc degeneration, unspecified cervical region: Secondary | ICD-10-CM | POA: Diagnosis not present

## 2024-07-29 DIAGNOSIS — E785 Hyperlipidemia, unspecified: Secondary | ICD-10-CM | POA: Diagnosis not present

## 2024-07-29 DIAGNOSIS — R946 Abnormal results of thyroid function studies: Secondary | ICD-10-CM | POA: Diagnosis not present

## 2024-09-03 DIAGNOSIS — H04123 Dry eye syndrome of bilateral lacrimal glands: Secondary | ICD-10-CM | POA: Diagnosis not present

## 2024-09-03 DIAGNOSIS — H401132 Primary open-angle glaucoma, bilateral, moderate stage: Secondary | ICD-10-CM | POA: Diagnosis not present

## 2024-10-01 DIAGNOSIS — M5030 Other cervical disc degeneration, unspecified cervical region: Secondary | ICD-10-CM | POA: Diagnosis not present

## 2024-10-01 DIAGNOSIS — M5412 Radiculopathy, cervical region: Secondary | ICD-10-CM | POA: Diagnosis not present

## 2024-10-01 DIAGNOSIS — F1120 Opioid dependence, uncomplicated: Secondary | ICD-10-CM | POA: Diagnosis not present

## 2024-10-08 ENCOUNTER — Emergency Department (HOSPITAL_COMMUNITY)

## 2024-10-08 ENCOUNTER — Inpatient Hospital Stay (HOSPITAL_COMMUNITY)
Admission: EM | Admit: 2024-10-08 | Discharge: 2024-10-15 | DRG: 871 | Disposition: A | Discharge status: Skilled Nursing Facility | Source: Skilled Nursing Facility | Attending: Internal Medicine | Admitting: Internal Medicine

## 2024-10-08 ENCOUNTER — Encounter (HOSPITAL_COMMUNITY): Payer: Self-pay | Admitting: Internal Medicine

## 2024-10-08 DIAGNOSIS — N1831 Chronic kidney disease, stage 3a: Secondary | ICD-10-CM | POA: Diagnosis not present

## 2024-10-08 DIAGNOSIS — E041 Nontoxic single thyroid nodule: Secondary | ICD-10-CM | POA: Diagnosis not present

## 2024-10-08 DIAGNOSIS — J189 Pneumonia, unspecified organism: Secondary | ICD-10-CM | POA: Diagnosis not present

## 2024-10-08 DIAGNOSIS — F411 Generalized anxiety disorder: Secondary | ICD-10-CM | POA: Diagnosis present

## 2024-10-08 DIAGNOSIS — J9601 Acute respiratory failure with hypoxia: Secondary | ICD-10-CM | POA: Diagnosis not present

## 2024-10-08 DIAGNOSIS — E871 Hypo-osmolality and hyponatremia: Secondary | ICD-10-CM | POA: Diagnosis present

## 2024-10-08 DIAGNOSIS — E7849 Other hyperlipidemia: Secondary | ICD-10-CM

## 2024-10-08 DIAGNOSIS — A419 Sepsis, unspecified organism: Principal | ICD-10-CM | POA: Diagnosis present

## 2024-10-08 DIAGNOSIS — Z1152 Encounter for screening for COVID-19: Secondary | ICD-10-CM

## 2024-10-08 DIAGNOSIS — I2694 Multiple subsegmental pulmonary emboli without acute cor pulmonale: Secondary | ICD-10-CM | POA: Diagnosis present

## 2024-10-08 DIAGNOSIS — N189 Chronic kidney disease, unspecified: Secondary | ICD-10-CM | POA: Insufficient documentation

## 2024-10-08 DIAGNOSIS — I2609 Other pulmonary embolism with acute cor pulmonale: Secondary | ICD-10-CM | POA: Diagnosis not present

## 2024-10-08 DIAGNOSIS — N390 Urinary tract infection, site not specified: Secondary | ICD-10-CM | POA: Diagnosis not present

## 2024-10-08 DIAGNOSIS — F5101 Primary insomnia: Secondary | ICD-10-CM

## 2024-10-08 DIAGNOSIS — K8689 Other specified diseases of pancreas: Secondary | ICD-10-CM | POA: Diagnosis present

## 2024-10-08 DIAGNOSIS — E876 Hypokalemia: Secondary | ICD-10-CM | POA: Diagnosis present

## 2024-10-08 DIAGNOSIS — Z809 Family history of malignant neoplasm, unspecified: Secondary | ICD-10-CM

## 2024-10-08 DIAGNOSIS — G4700 Insomnia, unspecified: Secondary | ICD-10-CM | POA: Diagnosis present

## 2024-10-08 DIAGNOSIS — K5730 Diverticulosis of large intestine without perforation or abscess without bleeding: Secondary | ICD-10-CM | POA: Diagnosis present

## 2024-10-08 DIAGNOSIS — Z66 Do not resuscitate: Secondary | ICD-10-CM | POA: Diagnosis not present

## 2024-10-08 DIAGNOSIS — I700 Atherosclerosis of aorta: Secondary | ICD-10-CM | POA: Diagnosis present

## 2024-10-08 DIAGNOSIS — K5190 Ulcerative colitis, unspecified, without complications: Secondary | ICD-10-CM

## 2024-10-08 DIAGNOSIS — M25552 Pain in left hip: Secondary | ICD-10-CM | POA: Diagnosis present

## 2024-10-08 DIAGNOSIS — I2699 Other pulmonary embolism without acute cor pulmonale: Secondary | ICD-10-CM | POA: Diagnosis not present

## 2024-10-08 DIAGNOSIS — M47812 Spondylosis without myelopathy or radiculopathy, cervical region: Secondary | ICD-10-CM | POA: Diagnosis present

## 2024-10-08 DIAGNOSIS — E785 Hyperlipidemia, unspecified: Secondary | ICD-10-CM | POA: Diagnosis not present

## 2024-10-08 DIAGNOSIS — W19XXXA Unspecified fall, initial encounter: Secondary | ICD-10-CM | POA: Diagnosis not present

## 2024-10-08 DIAGNOSIS — M7989 Other specified soft tissue disorders: Secondary | ICD-10-CM | POA: Diagnosis not present

## 2024-10-08 DIAGNOSIS — R6520 Severe sepsis without septic shock: Secondary | ICD-10-CM | POA: Diagnosis not present

## 2024-10-08 DIAGNOSIS — Z9181 History of falling: Secondary | ICD-10-CM

## 2024-10-08 DIAGNOSIS — I2489 Other forms of acute ischemic heart disease: Secondary | ICD-10-CM | POA: Diagnosis not present

## 2024-10-08 DIAGNOSIS — R599 Enlarged lymph nodes, unspecified: Secondary | ICD-10-CM | POA: Diagnosis present

## 2024-10-08 DIAGNOSIS — R7401 Elevation of levels of liver transaminase levels: Secondary | ICD-10-CM | POA: Diagnosis present

## 2024-10-08 DIAGNOSIS — J188 Other pneumonia, unspecified organism: Secondary | ICD-10-CM

## 2024-10-08 DIAGNOSIS — Z7401 Bed confinement status: Secondary | ICD-10-CM | POA: Diagnosis not present

## 2024-10-08 DIAGNOSIS — K838 Other specified diseases of biliary tract: Secondary | ICD-10-CM | POA: Diagnosis not present

## 2024-10-08 DIAGNOSIS — M978XXS Periprosthetic fracture around other internal prosthetic joint, sequela: Secondary | ICD-10-CM

## 2024-10-08 DIAGNOSIS — Y92009 Unspecified place in unspecified non-institutional (private) residence as the place of occurrence of the external cause: Secondary | ICD-10-CM | POA: Diagnosis not present

## 2024-10-08 DIAGNOSIS — K529 Noninfective gastroenteritis and colitis, unspecified: Secondary | ICD-10-CM | POA: Diagnosis present

## 2024-10-08 DIAGNOSIS — N179 Acute kidney failure, unspecified: Secondary | ICD-10-CM | POA: Insufficient documentation

## 2024-10-08 DIAGNOSIS — N183 Chronic kidney disease, stage 3 unspecified: Secondary | ICD-10-CM | POA: Diagnosis not present

## 2024-10-08 DIAGNOSIS — Z79899 Other long term (current) drug therapy: Secondary | ICD-10-CM

## 2024-10-08 DIAGNOSIS — E872 Acidosis, unspecified: Secondary | ICD-10-CM | POA: Diagnosis present

## 2024-10-08 DIAGNOSIS — R410 Disorientation, unspecified: Secondary | ICD-10-CM | POA: Diagnosis not present

## 2024-10-08 DIAGNOSIS — I6529 Occlusion and stenosis of unspecified carotid artery: Secondary | ICD-10-CM | POA: Diagnosis present

## 2024-10-08 DIAGNOSIS — G9341 Metabolic encephalopathy: Secondary | ICD-10-CM | POA: Diagnosis not present

## 2024-10-08 DIAGNOSIS — Z823 Family history of stroke: Secondary | ICD-10-CM

## 2024-10-08 DIAGNOSIS — Z87440 Personal history of urinary (tract) infections: Secondary | ICD-10-CM

## 2024-10-08 DIAGNOSIS — I82812 Embolism and thrombosis of superficial veins of left lower extremities: Secondary | ICD-10-CM | POA: Diagnosis not present

## 2024-10-08 DIAGNOSIS — I2601 Septic pulmonary embolism with acute cor pulmonale: Secondary | ICD-10-CM

## 2024-10-08 DIAGNOSIS — Z7901 Long term (current) use of anticoagulants: Secondary | ICD-10-CM | POA: Diagnosis not present

## 2024-10-08 DIAGNOSIS — R531 Weakness: Secondary | ICD-10-CM | POA: Diagnosis not present

## 2024-10-08 LAB — COMPREHENSIVE METABOLIC PANEL WITH GFR
ALT: 33 U/L (ref 0–44)
AST: 71 U/L — ABNORMAL HIGH (ref 15–41)
Albumin: 3.7 g/dL (ref 3.5–5.0)
Alkaline Phosphatase: 129 U/L — ABNORMAL HIGH (ref 38–126)
Anion gap: 12 (ref 5–15)
BUN: 28 mg/dL — ABNORMAL HIGH (ref 8–23)
CO2: 25 mmol/L (ref 22–32)
Calcium: 9.3 mg/dL (ref 8.9–10.3)
Chloride: 94 mmol/L — ABNORMAL LOW (ref 98–111)
Creatinine, Ser: 1.04 mg/dL — ABNORMAL HIGH (ref 0.44–1.00)
GFR, Estimated: 48 mL/min — ABNORMAL LOW (ref >60)
Glucose, Bld: 182 mg/dL — ABNORMAL HIGH (ref 70–99)
Potassium: 3.5 mmol/L (ref 3.5–5.1)
Sodium: 131 mmol/L — ABNORMAL LOW (ref 135–145)
Total Bilirubin: 0.7 mg/dL (ref 0.0–1.2)
Total Protein: 7.2 g/dL (ref 6.5–8.1)

## 2024-10-08 LAB — CBC WITH DIFFERENTIAL/PLATELET
Abs Immature Granulocytes: 0.29 K/uL — ABNORMAL HIGH (ref 0.00–0.07)
Basophils Absolute: 0.1 K/uL (ref 0.0–0.1)
Basophils Relative: 0 %
Eosinophils Absolute: 0 K/uL (ref 0.0–0.5)
Eosinophils Relative: 0 %
HCT: 35.3 % — ABNORMAL LOW (ref 36.0–46.0)
Hemoglobin: 11.3 g/dL — ABNORMAL LOW (ref 12.0–15.0)
Immature Granulocytes: 1 %
Lymphocytes Relative: 6 %
Lymphs Abs: 1.2 K/uL (ref 0.7–4.0)
MCH: 32.5 pg (ref 26.0–34.0)
MCHC: 32 g/dL (ref 30.0–36.0)
MCV: 101.4 fL — ABNORMAL HIGH (ref 80.0–100.0)
Monocytes Absolute: 1.1 K/uL — ABNORMAL HIGH (ref 0.1–1.0)
Monocytes Relative: 5 %
Neutro Abs: 19 K/uL — ABNORMAL HIGH (ref 1.7–7.7)
Neutrophils Relative %: 88 %
Platelets: 291 K/uL (ref 150–400)
RBC: 3.48 MIL/uL — ABNORMAL LOW (ref 3.87–5.11)
RDW: 11.9 % (ref 11.5–15.5)
WBC: 21.7 K/uL — ABNORMAL HIGH (ref 4.0–10.5)
nRBC: 0 % (ref 0.0–0.2)

## 2024-10-08 LAB — I-STAT CG4 LACTIC ACID, ED: Lactic Acid, Venous: 2.2 mmol/L (ref 0.5–1.9)

## 2024-10-08 LAB — TSH: TSH: 2.13 u[IU]/mL (ref 0.350–4.500)

## 2024-10-08 LAB — MRSA NEXT GEN BY PCR, NASAL: MRSA by PCR Next Gen: NOT DETECTED

## 2024-10-08 LAB — RESP PANEL BY RT-PCR (RSV, FLU A&B, COVID)  RVPGX2
Influenza A by PCR: NEGATIVE
Influenza B by PCR: NEGATIVE
Resp Syncytial Virus by PCR: NEGATIVE
SARS Coronavirus 2 by RT PCR: NEGATIVE

## 2024-10-08 LAB — PRO BRAIN NATRIURETIC PEPTIDE: Pro Brain Natriuretic Peptide: 1984 pg/mL — ABNORMAL HIGH (ref <300.0)

## 2024-10-08 LAB — LIPASE, BLOOD: Lipase: 16 U/L (ref 11–51)

## 2024-10-08 LAB — PROCALCITONIN: Procalcitonin: 0.63 ng/mL

## 2024-10-08 LAB — TROPONIN T, HIGH SENSITIVITY: Troponin T High Sensitivity: 21 ng/L — ABNORMAL HIGH (ref 0–19)

## 2024-10-08 MED ORDER — SODIUM CHLORIDE 0.9 % IV SOLN: 250.0000 mL | INTRAVENOUS | Status: AC | PRN

## 2024-10-08 MED ORDER — IOHEXOL 350 MG/ML SOLN
75.0000 mL | Freq: Once | INTRAVENOUS | Status: AC | PRN
  Administered 2024-10-08: 75 mL via INTRAVENOUS

## 2024-10-08 MED ORDER — LACTATED RINGERS IV BOLUS
1000.0000 mL | INTRAVENOUS | Status: AC
  Administered 2024-10-08: 1000 mL via INTRAVENOUS

## 2024-10-08 MED ORDER — LACTATED RINGERS IV SOLN: INTRAVENOUS | Status: AC

## 2024-10-08 MED ORDER — SODIUM CHLORIDE 0.9 % IV BOLUS
1000.0000 mL | Freq: Once | INTRAVENOUS | Status: AC
  Administered 2024-10-08: 1000 mL via INTRAVENOUS

## 2024-10-08 MED ORDER — CITALOPRAM HYDROBROMIDE 20 MG PO TABS
10.0000 mg | ORAL_TABLET | Freq: Every day | ORAL | Status: DC
  Administered 2024-10-09 – 2024-10-15 (×7): 10 mg via ORAL
  Filled 2024-10-08 (×7): qty 1

## 2024-10-08 MED ORDER — PIPERACILLIN-TAZOBACTAM 3.375 G IVPB
3.3750 g | Freq: Three times a day (TID) | INTRAVENOUS | Status: DC
  Administered 2024-10-08 – 2024-10-12 (×11): 3.375 g via INTRAVENOUS
  Filled 2024-10-08 (×11): qty 50

## 2024-10-08 MED ORDER — ENSURE PLUS HIGH PROTEIN PO LIQD
237.0000 mL | Freq: Every day | ORAL | Status: DC
  Administered 2024-10-10 – 2024-10-14 (×4): 237 mL via ORAL

## 2024-10-08 MED ORDER — GUAIFENESIN ER 600 MG PO TB12
600.0000 mg | ORAL_TABLET | Freq: Two times a day (BID) | ORAL | Status: DC
  Administered 2024-10-08 – 2024-10-15 (×13): 600 mg via ORAL
  Filled 2024-10-08 (×14): qty 1

## 2024-10-08 MED ORDER — DOXYCYCLINE HYCLATE 100 MG IV SOLR
100.0000 mg | Freq: Two times a day (BID) | INTRAVENOUS | Status: DC
  Administered 2024-10-08: 100 mg via INTRAVENOUS
  Filled 2024-10-08 (×2): qty 100

## 2024-10-08 MED ORDER — SODIUM CHLORIDE 0.9% FLUSH
3.0000 mL | Freq: Two times a day (BID) | INTRAVENOUS | Status: DC
  Administered 2024-10-08 – 2024-10-15 (×13): 3 mL via INTRAVENOUS

## 2024-10-08 MED ORDER — ONDANSETRON HCL 4 MG PO TABS: 4.0000 mg | ORAL_TABLET | Freq: Four times a day (QID) | ORAL | Status: DC | PRN

## 2024-10-08 MED ORDER — CEFTRIAXONE SODIUM 1 G IJ SOLR
1.0000 g | Freq: Once | INTRAMUSCULAR | Status: AC
  Administered 2024-10-08: 1 g via INTRAVENOUS
  Filled 2024-10-08: qty 10

## 2024-10-08 MED ORDER — DOXYCYCLINE HYCLATE 100 MG IV SOLR: 100.0000 mg | Freq: Two times a day (BID) | INTRAVENOUS | Status: DC

## 2024-10-08 MED ORDER — SODIUM CHLORIDE 0.9% FLUSH
3.0000 mL | Freq: Two times a day (BID) | INTRAVENOUS | Status: DC
  Administered 2024-10-08 – 2024-10-15 (×14): 3 mL via INTRAVENOUS

## 2024-10-08 MED ORDER — FENTANYL CITRATE (PF) 50 MCG/ML IJ SOSY
50.0000 ug | PREFILLED_SYRINGE | Freq: Once | INTRAMUSCULAR | Status: AC
  Administered 2024-10-08: 50 ug via INTRAVENOUS
  Filled 2024-10-08: qty 1

## 2024-10-08 MED ORDER — HEPARIN (PORCINE) 25000 UT/250ML-% IV SOLN
700.0000 [IU]/h | INTRAVENOUS | Status: AC
Start: 2024-10-08 — End: 2024-10-10
  Administered 2024-10-08: 700 [IU]/h via INTRAVENOUS
  Administered 2024-10-09: 1000 [IU]/h via INTRAVENOUS
  Filled 2024-10-08 (×2): qty 250

## 2024-10-08 MED ORDER — SENNA 8.6 MG PO TABS
1.0000 | ORAL_TABLET | Freq: Every day | ORAL | Status: DC | PRN
Start: 2024-10-08 — End: 2024-10-15

## 2024-10-08 MED ORDER — ACETAMINOPHEN 650 MG RE SUPP: 650.0000 mg | Freq: Four times a day (QID) | RECTAL | Status: DC | PRN

## 2024-10-08 MED ORDER — ONDANSETRON HCL 4 MG/2ML IJ SOLN: 4.0000 mg | Freq: Four times a day (QID) | INTRAMUSCULAR | Status: DC | PRN

## 2024-10-08 MED ORDER — SODIUM CHLORIDE 0.9 % IV SOLN
500.0000 mg | Freq: Once | INTRAVENOUS | Status: AC
  Administered 2024-10-08: 500 mg via INTRAVENOUS
  Filled 2024-10-08: qty 5

## 2024-10-08 MED ORDER — HEPARIN BOLUS VIA INFUSION
1500.0000 [IU] | Freq: Once | INTRAVENOUS | Status: AC
  Administered 2024-10-08: 1500 [IU] via INTRAVENOUS
  Filled 2024-10-08: qty 1500

## 2024-10-08 MED ORDER — LATANOPROST 0.005 % OP SOLN
1.0000 [drp] | Freq: Every day | OPHTHALMIC | Status: DC
  Administered 2024-10-08 – 2024-10-14 (×7): 1 [drp] via OPHTHALMIC
  Filled 2024-10-08: qty 2.5

## 2024-10-08 MED ORDER — ACETAMINOPHEN 325 MG PO TABS
650.0000 mg | ORAL_TABLET | Freq: Four times a day (QID) | ORAL | Status: DC | PRN
  Administered 2024-10-13: 650 mg via ORAL
  Filled 2024-10-08: qty 2

## 2024-10-08 MED ORDER — MELATONIN 5 MG PO TABS
5.0000 mg | ORAL_TABLET | Freq: Every evening | ORAL | Status: DC | PRN
  Administered 2024-10-11 – 2024-10-14 (×4): 5 mg via ORAL
  Filled 2024-10-08 (×4): qty 1

## 2024-10-08 MED ORDER — ALBUTEROL SULFATE (2.5 MG/3ML) 0.083% IN NEBU
2.5000 mg | INHALATION_SOLUTION | RESPIRATORY_TRACT | Status: DC | PRN
  Administered 2024-10-14: 2.5 mg via RESPIRATORY_TRACT
  Filled 2024-10-08: qty 3

## 2024-10-08 MED ORDER — CHLORHEXIDINE GLUCONATE CLOTH 2 % EX PADS
6.0000 | MEDICATED_PAD | Freq: Every day | CUTANEOUS | Status: DC
  Administered 2024-10-08 – 2024-10-15 (×8): 6 via TOPICAL

## 2024-10-08 MED ORDER — SODIUM CHLORIDE 0.9% FLUSH: 3.0000 mL | INTRAVENOUS | Status: DC | PRN

## 2024-10-08 NOTE — Sepsis Progress Note (Signed)
 Elink monitoring for the code sepsis protocol.

## 2024-10-08 NOTE — ED Notes (Signed)
Pt transport to ct 

## 2024-10-08 NOTE — ED Notes (Signed)
Patient is heading to CT.

## 2024-10-08 NOTE — H&P (Addendum)
 History and Physical    SHAILI DONALSON FMW:[id number] DOB: 11/20/1924 DOA: 10/08/2024  PCP: Nichole Senior, MD   Patient coming from: Home   Chief Complaint:  Chief Complaint  Patient presents with   Pneumonia   Urinary Tract Infection   ED TRIAGE note:    Pt was sent to ED by Dr. Nichole (PCP) for pneumonia and UTI. Clemens a few nights ago and increased confusion. She lives at [facility] and was seen by staff night of fall. Reports some soreness across hips and shoulders from. Denies LOC and was able to scoot on rear to call for help.       HPI:  Laura Hancock is a 89 y.o. female with medical history significant of CKD stage IIIa, hyperlipidemia, generalized anxiety disorder, insomnia and carotid stenosis presented to emergency department complaining of cough with frequent urination and hip joint pain. Per patient she probably of some kind of mechanical fall on Monday.  She has been also taking Levaquin for possible pneumonia.  However today she has increased confusion and seen by primary care doctor for evaluation.  Patient lives a friend's home.  She is complaining about left-sided hip joint pain with ambulation with head and discomfort.  Also complaining of abdominal pain.  Denies any fever, chill, nausea, vomiting constipation diarrhea.  Reporting having productive cough. Daughter reported patient has a fall at home however she did not hit her head during the fall.  Patient is complaining about ongoing cough for more than 2 weeks recently completed treatment with Levaquin without any improvement.  Patient is alert oriented and appropriately answering all the questions.  Patient's daughter Olam Bale at the bedside and another daughter Avelina also her phone.  Both of the patient daughter is not agreeable to start IV heparin drip/blood thinner yet as they want to discuss with the third sister and will let us  know when they make a decision.  ED Course:  At presentation to ED  patient is hemodynamically stable.  Afebrile. Lab, CBC showed leukocytosis 21.7 stable H&H normal platelet count. CMP showed low sodium 131, elevated creatinine 1.04, elevated AST 71 ALP 129.  Normal lipase level.  Elevated lactic acid level 2.2. Blood cultures are in process, respiratory panel pending and pending UA. Pending troponin and BNP level  Chest x-ray showing Stable reticular densities throughout both lungs, most consistent with scarring.    CTA chest showed single small nonocclusive pulmonary embolism within the right lower lobe branch.  No pulmonary arteries identified in the anterior left upper lobe, although no definitive thrombus identified; correlate clinically and consider follow-up imaging if concern persists. 3. Right heart strain with rv/lv ratio 1.2. 4. Multifocal ground-glass and airspace opacities are seen throughout both lungs, left greater than right. The most significant consolidation is in the left lower lobe. 5. Enlarged subcarinal lymph node measuring 13 mm short axis; indeterminate and may be reactive. 6. Trace bilateral pleural effusions. 7. Incidental right thyroid  nodule measuring 18 mm. Per ACR guidelines for patients 35 years, recommend non-emergent thyroid  ultrasound.  CT abdomen pelvis: 1. Consolidative process in the left lower lobe with small pleural effusions, reference previously dictated chest CT. 2. Collapsed appearance versus Mild colitis-type changes involving the transverse colon and splenic flexure. 3. Sigmoid diverticulosis without acute inflammatory wall thickening. 4. Prominent common bile duct measuring up to 10 mm, with slight prominence of the pancreatic duct, correlate with liver enzymes with follow-up MRCP if deemed appropriate. 5. Chronic-appearing superior endplate deformities at L1  and L2  Given patient presenting with a fall CT head and CT cervical spine has been ordered pending results.   In the ED code sepsis has been  activated.  Patient received azithromycin , ceftriaxone , fentanyl  and 1 L of LR bolus. IV heparin drip has been initiated with pharmacy.  I have extensive discussion with patient's daughter Avelina Dubin [phone]) over phone.  Patient daughter is very worried about to treat with blood thinner for pulmonary embolism given patient has recurrent fall and there is high risk of bleeding from that.  She would like to discuss with the family first before starting IV heparin drip.  I have the patient's daughter my phone number and she will call back after the family discussion.  Avelina reported to other daughter who is at the bedside to Jori that I was nonprofessional with her over her phone and I have apologized to the patient other daughter Ms. Jori multiple times at the bedside for any miscommunication.  Afterward I have around 25-minute discussion with the patient's daughter Marisella Puccio at the bedside who is again not sure about to start IV heparin drip and stated that she will probably let us  know after 7 AM tomorrow regarding this decision to treat the blood clot in the lung or not.  Hospitalist consulted for acute kidney injury, transaminitis, hyponatremia, pulmonary embolism, multifocal pneumonia, thyroid  nodule, bile duct dilation pancreatic duct dilation, mild colitis involving the transverse colon and left sided hip joint pain.   Update, at 9:20 PM patient's daughter Avelina called after discussion with other 2 sisters including Jori and have decided to moving forward to start with IV heparin drip.  Family understand that high risk of bleeding risk if patient falls and they would like to take the risk at this time given patient has a blood clot in the lung.    Significant labs in the ED: Lab Orders         Blood culture (routine x 2)         Resp panel by RT-PCR (RSV, Flu A&B, Covid) Anterior Nasal Swab         Urine Culture (for pregnant, neutropenic or urologic patients or  patients with an indwelling urinary catheter)         Expectorated Sputum Assessment w Gram Stain, Rflx to Resp Cult         MRSA Next Gen by PCR, Nasal         Expectorated Sputum Assessment w Gram Stain, Rflx to Resp Cult         Gastrointestinal Panel by PCR , Stool         C Difficile Quick Screen w PCR reflex         Urinalysis, Routine w reflex microscopic -Urine, Clean Catch         Comprehensive metabolic panel         CBC with Differential         Lipase, blood         Pro Brain natriuretic peptide         Legionella Pneumophila Serogp 1 Ur Ag         Strep pneumoniae urinary antigen         Procalcitonin         Comprehensive metabolic panel         CBC         Protime-INR         TSH  T4, free         Osmolality, urine         Sodium, urine, random         Osmolality         Creatinine, urine, random         I-Stat CG4 Lactic Acid       Review of Systems:  Review of Systems  Constitutional:  Negative for chills, fever, malaise/fatigue and weight loss.  Respiratory:  Positive for cough, sputum production and shortness of breath.   Gastrointestinal:  Negative for abdominal pain, diarrhea, heartburn, nausea and vomiting.  Genitourinary:  Negative for dysuria, flank pain, frequency and urgency.  Musculoskeletal:  Positive for falls and joint pain.       Left-sided hip joint pain  Neurological:  Negative for dizziness, focal weakness, seizures, weakness and headaches.  Psychiatric/Behavioral:  The patient is nervous/anxious.     Past Medical History:  Diagnosis Date   Arthritis    Back pain    Chronic kidney disease, stage 3a (HCC)    Insomnia    Leg pain    Neck pain     History reviewed. No pertinent surgical history.   reports that she has never smoked. She has never used smokeless tobacco. She reports current alcohol  use of about 14.0 standard drinks of alcohol  per week. She reports that she does not use drugs.  No Known Allergies  Family  History  Problem Relation Age of Onset   Cancer Father    Cancer Sister    Stroke Sister     Prior to Admission medications   Medication Sig Start Date End Date Taking? Authorizing Provider  acetaminophen  (TYLENOL ) 500 MG tablet Take 500 mg by mouth every 8 (eight) hours as needed for moderate pain.    [provider]  AZO CRANBERRY GUMMIES PO Take by mouth. 2 each day    [provider]  calcium carbonate (OSCAL) 1500 (600 Ca) MG TABS tablet Take 1,500 mg by mouth daily at 6 (six) AM.    [provider]  citalopram  (CELEXA ) 10 MG tablet Take 10 mg by mouth daily.    [provider]  HYDROcodone -acetaminophen  (NORCO/VICODIN) 5-325 MG tablet Take 1 tablet by mouth every 8 (eight) hours as needed for moderate pain. 03/31/23   Medina-Vargas, Monina C, NP  lactose free nutrition (BOOST) LIQD Take 237 mLs by mouth daily.  In the afternoon    [provider]  latanoprost  (XALATAN ) 0.005 % ophthalmic solution Place 1 drop into both eyes at bedtime. 02/12/23   [provider]  Multiple Vitamin (MULTIVITAMIN) tablet Take 1 tablet by mouth daily.    [provider]  ondansetron  (ZOFRAN ) 4 MG tablet Take 1 tablet (4 mg total) by mouth every 6 (six) hours as needed for nausea. 03/03/23   Danford, Lonni SQUIBB, MD  Ostomy Supplies (SKIN PREP WIPES) MISC Apply topically. Apply to Heels topically every day and evening shift for Skin care    [provider]  senna (SENOKOT) 8.6 MG TABS tablet Take 1 tablet (8.6 mg total) by mouth daily as needed for mild constipation. 03/03/23   Danford, Lonni SQUIBB, MD  Zinc Oxide 10 % OINT Apply topically as needed.    [provider]  zolpidem  (AMBIEN ) 5 MG tablet Take 5 mg by mouth daily. 12/24/19   [provider]     Physical Exam: Vitals:   10/08/24 1915 10/08/24 1930 10/08/24 1932 10/08/24 2023  BP: 117/71 ROLLEN)  123/59    Pulse: 96 99    Resp: (!) 28 (!) 21    Temp:    98.2  F (36.8 C)  TempSrc:    Oral  SpO2: (!) 88% 91%    Weight:   48.5 kg   Height:   5' 3 (1.6 m)     Physical Exam Constitutional:      General: She is not in acute distress.    Appearance: She is not ill-appearing.  HENT:     Mouth/Throat:     Mouth: Mucous membranes are moist.  Eyes:     Pupils: Pupils are equal, round, and reactive to light.  Cardiovascular:     Rate and Rhythm: Normal rate and regular rhythm.     Pulses: Normal pulses.     Heart sounds: Normal heart sounds.  Pulmonary:     Effort: Pulmonary effort is normal.     Breath sounds: Normal breath sounds.  Abdominal:     Palpations: Abdomen is soft.  Musculoskeletal:     Cervical back: Neck supple.     Right lower leg: No edema.     Left lower leg: No edema.  Skin:    Capillary Refill: Capillary refill takes less than 2 seconds.     Findings: No lesion or rash.  Neurological:     Mental Status: She is alert and oriented to person, place, and time.  Psychiatric:        Mood and Affect: Mood normal.        Behavior: Behavior normal.        Thought Content: Thought content normal.        Judgment: Judgment normal.      Labs on Admission: I have personally reviewed following labs and imaging studies  CBC: Recent Labs  Lab 10/08/24 1635  WBC 21.7*  NEUTROABS 19.0*  HGB 11.3*  HCT 35.3*  MCV 101.4*  PLT 291   Basic Metabolic Panel: Recent Labs  Lab 10/08/24 1635  NA 131*  K 3.5  CL 94*  CO2 25  GLUCOSE 182*  BUN 28*  CREATININE 1.04*  CALCIUM 9.3   GFR: Estimated Creatinine Clearance: 23.1 mL/min (A) (by C-G formula based on SCr of 1.04 mg/dL (H)). Liver Function Tests: Recent Labs  Lab 10/08/24 1635  AST 71*  ALT 33  ALKPHOS 129*  BILITOT 0.7  PROT 7.2  ALBUMIN 3.7   Recent Labs  Lab 10/08/24 1635  LIPASE 16   No results for input(s): AMMONIA in the last 168 hours. Coagulation Profile: No results for input(s): INR, PROTIME in the last 168 hours. Cardiac  Enzymes: No results for input(s): CKTOTAL, CKMB, CKMBINDEX, TROPONINI, TROPONINIHS in the last 168 hours. BNP (last 3 results) No results for input(s): BNP in the last 8760 hours. HbA1C: No results for input(s): HGBA1C in the last 72 hours. CBG: No results for input(s): GLUCAP in the last 168 hours. Lipid Profile: No results for input(s): CHOL, HDL, LDLCALC, TRIG, CHOLHDL, LDLDIRECT in the last 72 hours. Thyroid  Function Tests: No results for input(s): TSH, T4TOTAL, FREET4, T3FREE, THYROIDAB in the last 72 hours. Anemia Panel: No results for input(s): VITAMINB12, FOLATE, FERRITIN, TIBC, IRON, RETICCTPCT in the last 72 hours. Urine analysis:    Component Value Date/Time   COLORURINE YELLOW 09/24/2008 0940   APPEARANCEUR CLEAR 09/24/2008 0940   LABSPEC 1.022 09/24/2008 0940   PHURINE 7.5 09/24/2008 0940   GLUCOSEU NEGATIVE 09/24/2008 0940   HGBUR NEGATIVE 09/24/2008 0940   BILIRUBINUR Negative  04/17/2020 1530   KETONESUR NEGATIVE 09/24/2008 0940   PROTEINUR Negative 04/17/2020 1530   PROTEINUR NEGATIVE 09/24/2008 0940   UROBILINOGEN 1.0 04/17/2020 1530   UROBILINOGEN 0.2 09/24/2008 0940   NITRITE Negative 04/17/2020 1530   NITRITE NEGATIVE 09/24/2008 0940   LEUKOCYTESUR Large (3+) (A) 04/17/2020 1530    Radiological Exams on Admission: I have personally reviewed images CT Head Wo Contrast Result Date: 10/08/2024 EXAM: CT HEAD AND CERVICAL SPINE 10/08/2024 08:19:30 PM TECHNIQUE: CT of the head and cervical spine was performed without the administration of intravenous contrast. Multiplanar reformatted images are provided for review. Automated exposure control, iterative reconstruction, and/or weight-based adjustment of the mA/kV was utilized to reduce the radiation dose to as low as reasonably achievable. COMPARISON: Comparison is made with cervical spine MRI of 03/24/20. No prior cross-sectional imaging of the brain. CLINICAL HISTORY:  Polytrauma, blunt. Patient was sent to ED by Dr. Nichole (PCP) for pneumonia and UTI. Clemens a few nights ago and increased confusion. She lives at [facility] and was seen by staff night of fall. Reports some soreness across hips and shoulders from. Denies LOC; and was able to scoot on rear to call for help. FINDINGS: CT HEAD BRAIN AND VENTRICLES: Mild to moderate cerebrocerebellar atrophy, atrophic ventriculomegaly, and small vessel disease of the cerebral white matter are seen. There are a few small chronic bilateral gangliocapsular lacunar infarcts, and senescent mineralization in both basal ganglia. No acute intracranial hemorrhage. No mass effect or midline shift. No abnormal extra-axial fluid collection. No evidence of acute infarct. No hydrocephalus. ORBITS: No acute abnormality. SINUSES AND MASTOIDS: Mild mucosal thickening of the paranasal sinuses without fluid levels. No mastoid effusion is seen. SOFT TISSUES AND SKULL: No depressed skull fractures. No focal skull lesions. No visible scalp hematoma. . Benign dural calcifications are scattered along the falx and tentorium. Heavy calcification of the carotid siphons is seen. No hyperdense vessels. Osteopenia is present without evidence of fracture or focal pathologic bone lesion. CT CERVICAL SPINE BONES AND ALIGNMENT: Minimal 1 to 2 mm grade 1 degenerative anterior subluxation of C2-3 and C3-4, and 3 to 4 mm grade 1 anterolisthesis of C7 upon C1, also degenerative, all stable. No new alignment abnormality or traumatic malalignment is seen. Bone on bone anterior atlanto-dental joint space loss and bone on bone C1 C2 lateral mass articulation joint loss, with asymmetric bulky hypertrophic arthropathy of the left lateral mass articulation are also noted. DEGENERATIVE CHANGES: Degenerative subcortical cystic change and sclerosis of the dens, chronic discogenic endplate sclerosis opposite C6-C7, and ankylosed hypertrophic facet arthropathy bilaterally at C2-C4  and on the right at C5-6. SABRA The cervical discs are diffusely degenerated except for C2-C3, which is normal in height. The other discs are collapsed with varying degrees of partial interbody ankylosis. Bidirectional endplate osteophytes are seen from C2-3 through C7-T1, with mild effacement of the ventral cord surface secondary 2 posterior osteophytes at C4-5, C5-6, and C6-7, and additional hypertrophic dorsal ligamentous ossification present C6-C7. No levels show frank cord compression. Diffuse hypertrophic facet arthropathy, multilevel uncinate osteophytes, and multilevel severe acquired foraminal stenosis are seen, similar to the MRI in 2021. SOFT TISSUES: No prevertebral soft tissue swelling. VASCULATURE: Calcific plaques are seen at both carotid bifurcations. LUNGS: Asymmetric left greater than right pleuroparenchymal scarlike opacity is seen in the lung apices. THYROID : A 2.2 cm hypodense solid nodule is seen in the right lobe of the thyroid  gland. Further evaluation should only be considered taking into account advanced age and life expectancy.  IMPRESSION: 1. No acute intracranial CT findings or depressed skull fractures. Chronic changes. 2. No acute fracture or traumatic malalignment of the cervical spine. 3. Multilevel severe cervical foraminal stenosis, and cervical spondylosis without cord compression, similar to prior MRI in 2021. 4. Right thyroid  lobe solid nodule measuring 2.2 cm; ordinarily would recommend dedicated thyroid  ultrasound for characterization. In this case, further evaluation should take into account advanced age and life expectancy. 5. Carotid atherosclerosis. Electronically signed by: Francis Quam MD 10/08/2024 08:51 PM EDT RP Workstation: [id number]   CT Cervical Spine Wo Contrast Result Date: 10/08/2024 EXAM: CT HEAD AND CERVICAL SPINE 10/08/2024 08:19:30 PM TECHNIQUE: CT of the head and cervical spine was performed without the administration of intravenous contrast. Multiplanar  reformatted images are provided for review. Automated exposure control, iterative reconstruction, and/or weight-based adjustment of the mA/kV was utilized to reduce the radiation dose to as low as reasonably achievable. COMPARISON: Comparison is made with cervical spine MRI of 03/24/20. No prior cross-sectional imaging of the brain. CLINICAL HISTORY: Polytrauma, blunt. Patient was sent to ED by Dr. Nichole (PCP) for pneumonia and UTI. Clemens a few nights ago and increased confusion. She lives at [facility] and was seen by staff night of fall. Reports some soreness across hips and shoulders from. Denies LOC; and was able to scoot on rear to call for help. FINDINGS: CT HEAD BRAIN AND VENTRICLES: Mild to moderate cerebrocerebellar atrophy, atrophic ventriculomegaly, and small vessel disease of the cerebral white matter are seen. There are a few small chronic bilateral gangliocapsular lacunar infarcts, and senescent mineralization in both basal ganglia. No acute intracranial hemorrhage. No mass effect or midline shift. No abnormal extra-axial fluid collection. No evidence of acute infarct. No hydrocephalus. ORBITS: No acute abnormality. SINUSES AND MASTOIDS: Mild mucosal thickening of the paranasal sinuses without fluid levels. No mastoid effusion is seen. SOFT TISSUES AND SKULL: No depressed skull fractures. No focal skull lesions. No visible scalp hematoma. . Benign dural calcifications are scattered along the falx and tentorium. Heavy calcification of the carotid siphons is seen. No hyperdense vessels. Osteopenia is present without evidence of fracture or focal pathologic bone lesion. CT CERVICAL SPINE BONES AND ALIGNMENT: Minimal 1 to 2 mm grade 1 degenerative anterior subluxation of C2-3 and C3-4, and 3 to 4 mm grade 1 anterolisthesis of C7 upon C1, also degenerative, all stable. No new alignment abnormality or traumatic malalignment is seen. Bone on bone anterior atlanto-dental joint space loss and bone on bone  C1 C2 lateral mass articulation joint loss, with asymmetric bulky hypertrophic arthropathy of the left lateral mass articulation are also noted. DEGENERATIVE CHANGES: Degenerative subcortical cystic change and sclerosis of the dens, chronic discogenic endplate sclerosis opposite C6-C7, and ankylosed hypertrophic facet arthropathy bilaterally at C2-C4 and on the right at C5-6. SABRA The cervical discs are diffusely degenerated except for C2-C3, which is normal in height. The other discs are collapsed with varying degrees of partial interbody ankylosis. Bidirectional endplate osteophytes are seen from C2-3 through C7-T1, with mild effacement of the ventral cord surface secondary 2 posterior osteophytes at C4-5, C5-6, and C6-7, and additional hypertrophic dorsal ligamentous ossification present C6-C7. No levels show frank cord compression. Diffuse hypertrophic facet arthropathy, multilevel uncinate osteophytes, and multilevel severe acquired foraminal stenosis are seen, similar to the MRI in 2021. SOFT TISSUES: No prevertebral soft tissue swelling. VASCULATURE: Calcific plaques are seen at both carotid bifurcations. LUNGS: Asymmetric left greater than right pleuroparenchymal scarlike opacity is seen in the lung apices. THYROID : A 2.2  cm hypodense solid nodule is seen in the right lobe of the thyroid  gland. Further evaluation should only be considered taking into account advanced age and life expectancy. IMPRESSION: 1. No acute intracranial CT findings or depressed skull fractures. Chronic changes. 2. No acute fracture or traumatic malalignment of the cervical spine. 3. Multilevel severe cervical foraminal stenosis, and cervical spondylosis without cord compression, similar to prior MRI in 2021. 4. Right thyroid  lobe solid nodule measuring 2.2 cm; ordinarily would recommend dedicated thyroid  ultrasound for characterization. In this case, further evaluation should take into account advanced age and life expectancy. 5.  Carotid atherosclerosis. Electronically signed by: Francis Quam MD 10/08/2024 08:51 PM EDT RP Workstation: [id number]   CT Angio Chest PE W and/or Wo Contrast Addendum Date: 10/08/2024  ADDENDUM: Results were discussed with Dr. Ruthe on 09/30/2022 at 7:21 pm. ---------------------------------------------------- Electronically signed by: Greig Pique MD 10/08/2024 07:21 PM EDT RP Workstation: [id number]   Result Date: 10/08/2024 ORIGINAL REPORT  EXAM: CTA of the Chest with contrast for PE 10/08/2024 06:19:47 PM TECHNIQUE: CTA of the chest was performed after the administration of intravenous contrast. Multiplanar reformatted images are provided for review. MIP images are provided for review. Automated exposure control, iterative reconstruction, and/or weight based adjustment of the mA/kV was utilized to reduce the radiation dose to as low as reasonably achievable. COMPARISON: Chest x-ray 10/08/2024. CLINICAL HISTORY: Pulmonary embolism (PE) suspected, high prob. Pt was sent to ED by Dr. Nichole (PCP) for pneumonia and UTI. Clemens a few nights ago and increased confusion. She lives at [facility] and was seen by staff night of fall. Reports some soreness across hips and shoulders from. Denies LOC and was able to scoot on rear to call for help. FINDINGS: PULMONARY ARTERIES: Pulmonary arteries are adequately opacified for evaluation. There is a single small non-occlusive pulmonary embolism within right lower lobe lobar branch. Additional significant findings include vessel cutoff and no pulmonary arteries identified in the anterior left upper lobe, although no definitive thrombus identified. Main pulmonary artery is normal in caliber. MEDIASTINUM: Heart is mildly enlarged. The pericardium demonstrates no acute abnormality. There are atherosclerotic calcifications of the aorta. There is a hypodense right thyroid  nodule measuring 18 mm. There is an enlarged subcarinal lymph node measuring 13 mm short axis.  LYMPH NODES: Enlarged subcarinal lymph node measuring 13 mm short axis. No hilar or axillary lymphadenopathy. LUNGS AND PLEURA: There are trace bilateral pleural effusions. No pneumothorax. There is airspace consolidation within the inferior left lower lobe and lingula. Additional patchy smaller multifocal areas of airspace consolidation seen centrally and peripherally within the left upper lobe. Multifocal ground-glass opacities are seen peripherally in the right upper lobe. Minimal emphysema present. UPPER ABDOMEN: Limited images of the upper abdomen are unremarkable. SOFT TISSUES AND BONES: L1 chronic-appearing compression deformity. No acute soft tissue abnormality. IMPRESSION: 1. Single small non-occlusive pulmonary embolism within the right lower lobe lobar branch. 2. No pulmonary arteries identified in the anterior left upper lobe, although no definitive thrombus identified; correlate clinically and consider follow-up imaging if concern persists. 3. Right heart strain with rv/lv ratio 1.2. 4. Multifocal ground-glass and airspace opacities are seen throughout both lungs, left greater than right. The most significant consolidation is in the left lower lobe. 5. Enlarged subcarinal lymph node measuring 13 mm short axis; indeterminate and may be reactive. 6. Trace bilateral pleural effusions. 7. Incidental right thyroid  nodule measuring 18 mm. Per ACR guidelines for patients 35 years, recommend non-emergent thyroid  ultrasound. Electronically signed by: Greig Pique  MD 10/08/2024 06:37 PM EDT RP Workstation: [id number]   CT ABDOMEN PELVIS W CONTRAST Result Date: 10/08/2024 EXAM: CT ABDOMEN AND PELVIS WITH CONTRAST 10/08/2024 06:19:47 PM TECHNIQUE: CT of the abdomen and pelvis was performed with the administration of 75 mL of iohexol (OMNIPAQUE) 350 MG/ML injection. Multiplanar reformatted images are provided for review. Automated exposure control, iterative reconstruction, and/or weight-based adjustment of  the mA/kV was utilized to reduce the radiation dose to as low as reasonably achievable. COMPARISON: None available. CLINICAL HISTORY: Abdominal pain, acute, nonlocalized. Pt was sent to ED by Dr. Nichole (PCP) for pneumonia and UTI. Clemens a few nights ago and increased confusion. She lives at [facility] and was seen by staff night of fall. Reports some soreness across hips and shoulders from. Denies LOC and was able to scoot on rear to call for help. FINDINGS: LOWER CHEST: Bronchiectasis and chronic lung changes. Consolidative process in the left lower lobe. See separately dictated chest CT. LIVER: The liver is unremarkable. GALLBLADDER AND BILE DUCTS: Gallbladder is unremarkable. No calcified gallstone. Prominent common bile duct measuring up to 10 mm. SPLEEN: Splenic granuloma. PANCREAS: Atrophic pancreas without inflammation. Proximal pancreatic duct slightly dilated up to 4 mm. ADRENAL GLANDS: No acute abnormality. KIDNEYS, URETERS AND BLADDER: No stones in the kidneys or ureters. No hydronephrosis. No perinephric or periureteral stranding. Bladder partially obscured by artifact from right hip hardware. GI AND BOWEL: Stomach demonstrates no acute abnormality. Under distention versus mild colitis type changes of the transverse colon and splenic flexure. Diverticular disease of the sigmoid colon without acute wall thickening. There is no bowel obstruction. PERITONEUM AND RETROPERITONEUM: No ascites. No free air. VASCULATURE: Aorta is normal in caliber. Advanced aortic atherosclerosis without aneurysm. LYMPH NODES: No lymphadenopathy. REPRODUCTIVE ORGANS: Hysterectomy. No adnexal mass. BONES AND SOFT TISSUES: Right hip replacement with artifact. No acute osseous abnormality. Scoliosis and degenerative changes of the spine. Chronic-appearing superior endplate deformities at L1 and L2. No focal soft tissue abnormality. IMPRESSION: 1. Consolidative process in the left lower lobe with small pleural effusions,  reference previously dictated chest CT. 2. Collapsed appearance versus Mild colitis-type changes involving the transverse colon and splenic flexure. 3. Sigmoid diverticulosis without acute inflammatory wall thickening. 4. Prominent common bile duct measuring up to 10 mm, with slight prominence of the pancreatic duct, correlate with liver enzymes with follow-up MRCP if deemed appropriate. 5. Chronic-appearing superior endplate deformities at L1 and L2 Electronically signed by: Luke Bun MD 10/08/2024 06:55 PM EDT RP Workstation: [id number]   DG Chest 2 View Result Date: 10/08/2024 EXAM: 2 VIEW(S) XRAY OF THE CHEST 10/08/2024 04:21:24 PM COMPARISON: 02/24/2023 CLINICAL HISTORY: penumonia. Per chart - Pt was sent to ED by Dr. Nichole (PCP) for pneumonia and UTI. Clemens a few nights ago and increased confusion. She lives at [facility] and was seen by staff night of fall. Reports some soreness across hips and shoulders from. Denies LOC ; and was able to scoot on rear to call for help. FINDINGS: LUNGS AND PLEURA: Stable reticular densities are noted throughout both lungs most consistent with scarring. No pulmonary edema. No pleural effusion. No pneumothorax. HEART AND MEDIASTINUM: No acute abnormality of the cardiac and mediastinal silhouettes. BONES AND SOFT TISSUES: No acute osseous abnormality. IMPRESSION: 1. Stable reticular densities throughout both lungs, most consistent with scarring. Electronically signed by: Lynwood Seip MD 10/08/2024 04:34 PM EDT RP Workstation: [id number]     EKG: My personal interpretation of EKG shows: Normal sinus rhythm heart rate 94.  Assessment/Plan: Principal Problem:   Sepsis due to pneumonia [facility]) Active Problems:   CAP (community acquired pneumonia)   Acute pulmonary embolism (HCC)   Colitis   Insomnia   CKD (chronic kidney disease) stage 3, GFR 30-59 ml/min (HCC)   Fall at home, initial encounter   Acute kidney injury superimposed on chronic kidney  disease   Transaminitis   Hyponatremia   Dilation of pancreatic duct   Joint pain of left hip on movement   Hyperlipidemia   Generalized anxiety disorder   Carotid stenosis    Assessment and Plan: Sepsis secondary to pneumonia Community-acquired pneumonia Acute hypoxic respiratory failure Acute metabolic encephalopathy-in terms of confusion in the setting of pneumonia -Presented emergency department complaining of mechanical fall and development of some confusion, cough and left-sided hip joint pain.  Family reported that patient has a fall Monday since then she has left-sided hip joint pain denies hitting of the head - At presentation to ED patient is tachypneic, O2 sat dropped 88% room.  CBC showed leukocytosis 21.7.  Elevated lactic acid level 2.2. - CTA chest showed small nonocclusive PE with right ventricular strain, multifocal pneumonia.  CT abdomen pelvis evidence for colitis, bile duct dilation 10 mm and prominence of the pancreatic duct. - Patient meets sepsis criteria in the setting of pneumonia and colitis. - In the ED patient received 2 L of NS and LR bolus and currently on maintenance fluid LR 150 cc/h.  Also received ceftriaxone  and azithromycin . - Continue broad-spectrum coverage with IV doxycycline  and IV Zosyn for management for both pneumonia and colitis. - Pending blood culture, sputum culture, urine Legionella antigen, urine strep antigen and respiratory panel.  Pending procalcitonin level.  Continue to trend lactic acid level.   Acute pulmonary embolism -CTA chest showed small nonocclusive PE and right ventricular strain.   -Discussed with patient daughter Avelina over phone she is not agreeable to start with IV blood thinner at this time until she will do further discussion with the family and will keep updated.  Heparin initiated heparin drip yet. - CT head and CT cervical spine no acute intracranial abnormality. CT cervical spine showed multilevel cervical  spondylosis without any cord compression. -Elevated troponin 21 and pending proBNP level. -Both of patient's daughter Avelina and Jori are not not agreeable to start IV heparin drip tonight and will let us  know after family discussion probably after 7 AM tomorrow to decide if they want to treat it or not. Continue SCD and TED hose. -Obtaining echocardiogram and bilateral lower extremity ultrasound to rule out DVT.  Addendum - At 9:20 PM patient's daughter Avelina called after discussion with other 2 sisters including Jori  have decided to moving forward to start with IV heparin drip.  Family understand that high risk of bleeding risk if patient falls and they would like to take the risk at this time given patient has a blood clot in the lung.  Elevated troponin-secondary to the manage in the context of sepsis - Elevated troponin 21.  EKG showed normal sinus rhythm heart 94.  Patient denies any chest pain shortness of breath.  At this time there is no concern for acute coronary syndrome.  Colitis -Continue IV vancomycin.  Blood cultures are in process.  Patient does not have any diarrhea.  Checking C. difficile and GI panel.  AKI on CKD stage III A -Elevated creatinine 1.04.  Prerenal acute kidney injury in the setting of sepsis.  Pending UA.  Continue IV hydration. -Family reported the  patient has history of recurrent UTI.  Checking UA, urine sodium and creatinine. -Continue to monitor improvement of renal function  Thyroid  nodule -CT cervical spine showed 2.2 cm right thyroid  solid nodule. -Need to follow-up with outpatient thyroid  ultrasound for characterization.  Checking TSH and T4 level   Fall at home Left-sided hip joint pain from fall -Present emergency department complaining of mechanical fall.  Daughter reported that nursing home states she did not hit her head. - Since the fall patient is complaining of left-sided hip joint pain - CT abdomen pelvis no evidence of fracture  or dislocation.  CT head no acute intracranial abnormality.  CT cervical spine no evidence of fracture or dislocation. -Continue fall precaution and ambulate with assistance. -Family requested for PT OT eval.  Consulted PT and OT for evaluation of balance.   Euvolemic hyponatremia -Low sodium 131.  Family reported that patient is drinking a lot of water lately.  Urine sodium, urine osmolarity and serum osmolarity.  Currently on maintenance fluid.  Continue to monitor serum sodium level.   Mild transaminitis Dilation of the hepatic and pancreatic duct -CMP showing slight elevated AST 41 and ALP 129.  Normal ALT and bilirubin level.  CT abdomen pelvis showing bile duct dilation 10 mm small pancreatic dilation.  Given there is no evidence of significant transaminitis and patient does not have any acute abdominal pain at this time there is no concern for  cholelithiasis and acute cholecystitis. There is no indication for MRCP at this time. -Will follow-up with morning CMP. - Please refer to GI outpatient on discharge.   History of glucoma Continue eyedrops  Generalized anxiety disorder - Continue Celexa   Insomnia - Holding Ambien  given patient presenting with fall and high risk of recurrent fall. Continue melatonin as needed at bedtime.    DVT prophylaxis:  SCDs and TED hose..  IV heparin drip Code Status:  DNR/DNI(Do NOT Intubate) verified with patient daughter Avelina over phone patient is DNR.  Also patient has a signed DNR form. Diet:  Family Communication:   Family was present at bedside, at the time of interview. Opportunity was given to ask question and all questions were answered satisfactorily.  Disposition Plan: Need to follow-up with culture results echocardiogram and DVT ultrasound. Consults: None indicated at this time Admission status:   Inpatient, Step Down Unit  Severity of Illness: The appropriate patient status for this patient is INPATIENT. Inpatient status is  judged to be reasonable and necessary in order to provide the required intensity of service to ensure the patient's safety. The patient's presenting symptoms, physical exam findings, and initial radiographic and laboratory data in the context of their chronic comorbidities is felt to place them at high risk for further clinical deterioration. Furthermore, it is not anticipated that the patient will be medically stable for discharge from the hospital within 2 midnights of admission.   * I certify that at the point of admission it is my clinical judgment that the patient will require inpatient hospital care spanning beyond 2 midnights from the point of admission due to high intensity of service, high risk for further deterioration and high frequency of surveillance required.DEWAINE    Arasely Akkerman, MD [facility]  How to contact the [facility] Attending or Consulting provider 7A - 7P or covering provider during after hours 7P -7A, for this patient.  Check the care team in [facility] and look for a) attending/consulting [facility] provider listed and b) the [facility] team listed Log into [email or web address] and use  [facility] universal password to access. If you do not have the password, please contact the hospital operator. Locate the [facility] provider you are looking for under [facility] and page to a number that you can be directly reached. If you still have difficulty reaching the provider, please page the [facility] (Director on Call) for the Hospitalists listed on amion for assistance.  10/08/2024, 9:22 PM

## 2024-10-08 NOTE — Sepsis Progress Note (Addendum)
 Secure chat sent to assigned staff, Cathryn H., regarding the need for second lactic acid to be drawn. She informed me that patient was on the floor and that it was not drawn because IVF bolus was still infusing. Will reach out to care team on the unit.   10/08/24 21:50 Notified bedside nurse on unit of need to draw repeat lactic acid.

## 2024-10-08 NOTE — ED Triage Notes (Addendum)
 Pt was sent to ED by Dr. Nichole (PCP) for pneumonia and UTI. Clemens a few nights ago and increased confusion. She lives at [facility] and was seen by staff night of fall. Reports some soreness across hips and shoulders from. Denies LOC and was able to scoot on rear to call for help.

## 2024-10-08 NOTE — Progress Notes (Signed)
 PHARMACY - ANTICOAGULATION CONSULT NOTE  Pharmacy Consult for heparin Indication: pulmonary embolus  No Known Allergies  Patient Measurements: Height: 5' 3 (160 cm) Weight: 48.5 kg (107 lb) IBW/kg (Calculated) : 52.4 HEPARIN DW (KG): 48.5  Vital Signs: Temp: 98.2 F (36.8 C) (10/28 2023) Temp Source: Oral (10/28 2023) BP: 123/59 (10/28 1930) Pulse Rate: 99 (10/28 1930)  Labs: Recent Labs    10/08/24 1635  HGB 11.3*  HCT 35.3*  PLT 291  CREATININE 1.04*    Estimated Creatinine Clearance: 23.1 mL/min (A) (by C-G formula based on SCr of 1.04 mg/dL (H)).   Medical History: Past Medical History:  Diagnosis Date   Arthritis    Back pain    Chronic kidney disease, stage 3a (HCC)    Insomnia    Leg pain    Neck pain      Assessment: 89 yo female with single small nonocclusive PE, pharmacy to dose heparin drip, no prior AC noted  Hgb 11.3, plts 291, Scr 1.04  Goal of Therapy:  Heparin level 0.3-0.7 units/ml Monitor platelets by anticoagulation protocol: Yes   Plan:  Using [place]: Heparin bolus 1500 units x 1 Start heparin drip at 700 units/hr Heparin level in 8 hours Daily CBC   Leeroy Mace RPh 10/08/2024, 9:39 PM

## 2024-10-08 NOTE — ED Provider Notes (Addendum)
 [place] EMERGENCY DEPARTMENT AT [facility] Provider Note   CSN: [id number] Arrival date & time: 10/08/24  1527     Patient presents with: Pneumonia and Urinary Tract Infection   Laura Hancock is a 89 y.o. female.   Here with cough pain with urination and hip pain.  Laura Hancock sounds like Laura Hancock has some sort of mechanical fall Monday.  Sounds like Laura Hancock is on Levaquin for possible pneumonia.  Laura Hancock has had some increased confusion possibly and sent here by her primary care doctor for evaluation.  Laura Hancock lives at friend's home.  Laura Hancock is complaining of some pain in her left hip.  Laura Hancock has been ambulating but with discomfort.  Laura Hancock is also had abdominal pain.  Laura Hancock has history of CKD arthritis.  Laura Hancock denies any fevers or chills.  Laura Hancock is not sure how long Laura Hancock has been on antibiotic for.  The history is provided by the patient.       Prior to Admission medications   Medication Sig Start Date End Date Taking? Authorizing Provider  acetaminophen  (TYLENOL ) 500 MG tablet Take 500 mg by mouth every 8 (eight) hours as needed for moderate pain.    [provider]  AZO CRANBERRY GUMMIES PO Take by mouth. 2 each day    [provider]  calcium carbonate (OSCAL) 1500 (600 Ca) MG TABS tablet Take 1,500 mg by mouth daily at 6 (six) AM.    [provider]  citalopram  (CELEXA ) 10 MG tablet Take 10 mg by mouth daily.    [provider]  HYDROcodone -acetaminophen  (NORCO/VICODIN) 5-325 MG tablet Take 1 tablet by mouth every 8 (eight) hours as needed for moderate pain. 03/31/23   Medina-Vargas, Monina C, NP  lactose free nutrition (BOOST) LIQD Take 237 mLs by mouth daily.  In the afternoon    [provider]  latanoprost  (XALATAN ) 0.005 % ophthalmic solution Place 1 drop into both eyes at bedtime. 02/12/23   [provider]  Multiple Vitamin (MULTIVITAMIN) tablet Take 1 tablet by mouth daily.    [provider]  ondansetron  (ZOFRAN ) 4 MG tablet Take 1  tablet (4 mg total) by mouth every 6 (six) hours as needed for nausea. 03/03/23   Danford, Lonni SQUIBB, MD  Ostomy Supplies (SKIN PREP WIPES) MISC Apply topically. Apply to Heels topically every day and evening shift for Skin care    [provider]  senna (SENOKOT) 8.6 MG TABS tablet Take 1 tablet (8.6 mg total) by mouth daily as needed for mild constipation. 03/03/23   Danford, Lonni SQUIBB, MD  Zinc Oxide 10 % OINT Apply topically as needed.    [provider]  zolpidem  (AMBIEN ) 5 MG tablet Take 5 mg by mouth daily. 12/24/19   [provider]    Allergies: Patient has no known allergies.    Review of Systems  Updated Vital Signs BP 117/71   Pulse 96   Temp 98.5 F (36.9 C) (Oral)   Resp (!) 28   Ht 5' 3 (1.6 m)   Wt 48.5 kg   SpO2 (!) 88%   BMI 18.95 kg/m   Physical Exam Vitals and nursing note reviewed.  Constitutional:      General: Laura Hancock is not in acute distress.    Appearance: Laura Hancock is well-developed. Laura Hancock is not ill-appearing.  HENT:     Head: Normocephalic and atraumatic.     Nose: Nose normal.     Mouth/Throat:     Mouth: Mucous membranes are moist.  Eyes:     Extraocular Movements: Extraocular movements intact.     Conjunctiva/sclera: Conjunctivae normal.     Pupils: Pupils are equal, round, and reactive to light.  Cardiovascular:     Rate and Rhythm: Normal rate and regular rhythm.     Pulses: Normal pulses.     Heart sounds: Normal heart sounds. No murmur heard. Pulmonary:     Effort: Pulmonary effort is normal. No respiratory distress.     Breath sounds: Normal breath sounds.  Abdominal:     Palpations: Abdomen is soft.     Tenderness: There is abdominal tenderness.  Musculoskeletal:        General: Tenderness present. No swelling.     Cervical back: Normal range of motion and neck supple.     Comments: Tenderness to the left hip but no midline spinal tenderness  Skin:    General: Skin is warm and dry.     Capillary Refill:  Capillary refill takes less than 2 seconds.  Neurological:     Mental Status: Laura Hancock is alert.  Psychiatric:        Mood and Affect: Mood normal.     (all labs ordered are listed, but only abnormal results are displayed) Labs Reviewed  COMPREHENSIVE METABOLIC PANEL WITH GFR - Abnormal; Notable for the following components:      Result Value   Sodium 131 (*)    Chloride 94 (*)    Glucose, Bld 182 (*)    BUN 28 (*)    Creatinine, Ser 1.04 (*)    AST 71 (*)    Alkaline Phosphatase 129 (*)    GFR, Estimated 48 (*)    All other components within normal limits  CBC WITH DIFFERENTIAL/PLATELET - Abnormal; Notable for the following components:   WBC 21.7 (*)    RBC 3.48 (*)    Hemoglobin 11.3 (*)    HCT 35.3 (*)    MCV 101.4 (*)    Neutro Abs 19.0 (*)    Monocytes Absolute 1.1 (*)    Abs Immature Granulocytes 0.29 (*)    All other components within normal limits  I-STAT CG4 LACTIC ACID, ED - Abnormal; Notable for the following components:   Lactic Acid, Venous 2.2 (*)    All other components within normal limits  RESP PANEL BY RT-PCR (RSV, FLU A&B, COVID)  RVPGX2  CULTURE, BLOOD (ROUTINE X 2)  CULTURE, BLOOD (ROUTINE X 2)  URINE CULTURE  EXPECTORATED SPUTUM ASSESSMENT W GRAM STAIN, RFLX TO RESP C  MRSA NEXT GEN BY PCR, NASAL  LIPASE, BLOOD  URINALYSIS, ROUTINE W REFLEX MICROSCOPIC  PRO BRAIN NATRIURETIC PEPTIDE  I-STAT CG4 LACTIC ACID, ED  TROPONIN T, HIGH SENSITIVITY    EKG: EKG Interpretation Date/Time:  Tuesday October 08 2024 18:27:57 EDT Ventricular Rate:  94 PR Interval:  214 QRS Duration:  115 QT Interval:  387 QTC Calculation: 484 R Axis:   -56  Text Interpretation: Sinus rhythm Borderline prolonged PR interval Confirmed by Ruthe Cornet [phone]) on 10/08/2024 6:39:33 PM  Radiology:    .Critical Care  Performed by: Ruthe Cornet, DO Authorized by: Ruthe Cornet, DO   Critical care provider statement:    Critical care time (minutes):  35   Critical  care was necessary to treat or prevent imminent or life-threatening deterioration of the following conditions:  Sepsis   Critical care was time spent personally by me on the following activities:  Blood draw for specimens, development of treatment plan with patient or surrogate, discussions  with primary provider, evaluation of patient's response to treatment, examination of patient, obtaining history from patient or surrogate, ordering and review of laboratory studies, ordering and performing treatments and interventions, ordering and review of radiographic studies, pulse oximetry and re-evaluation of patient's condition   Care discussed with: admitting provider      Medications Ordered in the ED  azithromycin  (ZITHROMAX ) 500 mg in sodium chloride  0.9 % 250 mL IVPB (has no administration in time range)  cefTRIAXone  (ROCEPHIN ) 1 g in sodium chloride  0.9 % 100 mL IVPB (1 g Intravenous New Bag/Given 10/08/24 1936)  lactated ringers  bolus 1,000 mL (has no administration in time range)  lactated ringers  infusion (has no administration in time range)  sodium chloride  0.9 % bolus 1,000 mL (0 mLs Intravenous Stopped 10/08/24 1936)  fentaNYL  (SUBLIMAZE ) injection 50 mcg (50 mcg Intravenous Given 10/08/24 1800)  iohexol (OMNIPAQUE) 350 MG/ML injection 75 mL (75 mLs Intravenous Contrast Given 10/08/24 1803)                                    Medical Decision Making Amount and/or Complexity of Data Reviewed Labs: ordered. Radiology: ordered.  Risk Prescription drug management. Decision regarding hospitalization.   Jenkins JONETTA Fabry is here with fall hip pain concern for pneumonia or UTI.  Laura Hancock has been on antibiotics for possible lung infection.  Laura Hancock has been coughing.  Denies any fevers.  Not sure how long Laura Hancock has been on antibiotics for but Laura Hancock thinks for the last few days.  Possibly some confusion per primary care doctor.  Overall Laura Hancock got normal vitals here.  Laura Hancock is having pain in her left hip.  Is  having abdominal pain.  Laura Hancock has a dry cough.  Maybe some UTI symptoms.  Laura Hancock has a history of arthritis insomnia.  Laura Hancock is well-appearing otherwise.  Will get a CT scan of her head neck chest abdomen and pelvis to evaluate for infectious and traumatic etiologies.  Will pursue infectious workup.  White count came back at 24 so we will add some blood cultures lactic acid and check remaining labs.  Will hold antibiotics at this time and look for source for infection.  Could be viral process.  Could be pneumonia could be UTI could be intra-abdominal process.  Overall chest x-ray did not show any obvious pneumonia.  Overall patient with no significant AKI but does have a white count of 24 lactic acid originally 2.2.  CT scan does show pneumonia shows PE with some mild right heart strain.  I talked to radiology on the phone.  Laura Hancock also has maybe colitis type changes in her colon.  Gallbladder and liver enzymes unremarkable.  Is not really having any right upper quadrant abdominal pain.  Do not think Laura Hancock really needs any further workup for GI process.  Overall we will start heparin for pulmonary embolism.  Will finish broad-spectrum IV antibiotics for pneumonia and admit for further care.  Hospitalist talked to family and they will hold actually on blood thinner treatment at this time and talk further about risks and benefits of this.  Heparin held at this time.  Will continue family discussion about this.  This chart was dictated using voice recognition software.  Despite best efforts to proofread,  errors can occur which can change the documentation meaning.      Final diagnoses:  Sepsis, due to unspecified organism, unspecified whether acute organ dysfunction present (HCC)  Other acute pulmonary embolism, unspecified whether acute cor pulmonale present [facility])  Multifocal pneumonia    ED Discharge Orders     None          Ruthe Cornet, DO 10/08/24 1923    Ruthe Cornet, DO 10/08/24 1940

## 2024-10-09 ENCOUNTER — Inpatient Hospital Stay (HOSPITAL_COMMUNITY)

## 2024-10-09 DIAGNOSIS — I2609 Other pulmonary embolism with acute cor pulmonale: Secondary | ICD-10-CM

## 2024-10-09 DIAGNOSIS — J189 Pneumonia, unspecified organism: Secondary | ICD-10-CM | POA: Diagnosis not present

## 2024-10-09 DIAGNOSIS — A419 Sepsis, unspecified organism: Secondary | ICD-10-CM | POA: Diagnosis not present

## 2024-10-09 DIAGNOSIS — M7989 Other specified soft tissue disorders: Secondary | ICD-10-CM | POA: Diagnosis not present

## 2024-10-09 LAB — CBC
HCT: 29.3 % — ABNORMAL LOW (ref 36.0–46.0)
Hemoglobin: 9.7 g/dL — ABNORMAL LOW (ref 12.0–15.0)
MCH: 33.8 pg (ref 26.0–34.0)
MCHC: 33.1 g/dL (ref 30.0–36.0)
MCV: 102.1 fL — ABNORMAL HIGH (ref 80.0–100.0)
Platelets: 239 K/uL (ref 150–400)
RBC: 2.87 MIL/uL — ABNORMAL LOW (ref 3.87–5.11)
RDW: 11.9 % (ref 11.5–15.5)
WBC: 19.5 K/uL — ABNORMAL HIGH (ref 4.0–10.5)
nRBC: 0 % (ref 0.0–0.2)

## 2024-10-09 LAB — COMPREHENSIVE METABOLIC PANEL WITH GFR
ALT: 38 U/L (ref 0–44)
AST: 75 U/L — ABNORMAL HIGH (ref 15–41)
Albumin: 2.8 g/dL — ABNORMAL LOW (ref 3.5–5.0)
Alkaline Phosphatase: 299 U/L — ABNORMAL HIGH (ref 38–126)
Anion gap: 8 (ref 5–15)
BUN: 18 mg/dL (ref 8–23)
CO2: 25 mmol/L (ref 22–32)
Calcium: 8.2 mg/dL — ABNORMAL LOW (ref 8.9–10.3)
Chloride: 104 mmol/L (ref 98–111)
Creatinine, Ser: 0.74 mg/dL (ref 0.44–1.00)
GFR, Estimated: >60 mL/min (ref >60)
Glucose, Bld: 115 mg/dL — ABNORMAL HIGH (ref 70–99)
Potassium: 3.6 mmol/L (ref 3.5–5.1)
Sodium: 137 mmol/L (ref 135–145)
Total Bilirubin: 0.5 mg/dL (ref 0.0–1.2)
Total Protein: 5.4 g/dL — ABNORMAL LOW (ref 6.5–8.1)

## 2024-10-09 LAB — C DIFFICILE QUICK SCREEN W PCR REFLEX
C Diff antigen: NEGATIVE
C Diff interpretation: NOT DETECTED
C Diff toxin: NEGATIVE

## 2024-10-09 LAB — ECHOCARDIOGRAM COMPLETE
AR max vel: 1.79 cm2
AV Area VTI: 1.46 cm2
AV Area mean vel: 1.53 cm2
AV Mean grad: 8 mmHg
AV Peak grad: 17.2 mmHg
Ao pk vel: 2.08 m/s
Area-P 1/2: 5.7 cm2
Calc EF: 59.7 %
Height: 63 in
MV VTI: 2.1 cm2
S' Lateral: 2.6 cm
Single Plane A2C EF: 60.3 %
Single Plane A4C EF: 61.6 %
Weight: 1712 [oz_av]

## 2024-10-09 LAB — PROTIME-INR
INR: 1.2 (ref 0.8–1.2)
Prothrombin Time: 15.5 s — ABNORMAL HIGH (ref 11.4–15.2)

## 2024-10-09 LAB — OSMOLALITY: Osmolality: 284 mosm/kg (ref 275–295)

## 2024-10-09 LAB — LACTIC ACID, PLASMA
Lactic Acid, Venous: 2.2 mmol/L (ref 0.5–1.9)
Lactic Acid, Venous: 0.9 mmol/L (ref 0.5–1.9)

## 2024-10-09 LAB — TROPONIN T, HIGH SENSITIVITY: Troponin T High Sensitivity: 23 ng/L — ABNORMAL HIGH (ref 0–19)

## 2024-10-09 LAB — HEPARIN LEVEL (UNFRACTIONATED)
Heparin Unfractionated: <0.1 [IU]/mL — ABNORMAL LOW (ref 0.30–0.70)
Heparin Unfractionated: 0.13 [IU]/mL — ABNORMAL LOW (ref 0.30–0.70)

## 2024-10-09 LAB — T4, FREE: Free T4: 0.81 ng/dL (ref 0.61–1.12)

## 2024-10-09 MED ORDER — HEPARIN BOLUS VIA INFUSION
1500.0000 [IU] | Freq: Once | INTRAVENOUS | Status: AC
  Administered 2024-10-09: 1500 [IU] via INTRAVENOUS
  Filled 2024-10-09: qty 1500

## 2024-10-09 MED ORDER — ZOLPIDEM TARTRATE 5 MG PO TABS
5.0000 mg | ORAL_TABLET | Freq: Every day | ORAL | Status: DC
  Administered 2024-10-09 – 2024-10-13 (×5): 5 mg via ORAL
  Filled 2024-10-09 (×6): qty 1

## 2024-10-09 MED ORDER — DOXYCYCLINE HYCLATE 100 MG PO TABS
100.0000 mg | ORAL_TABLET | Freq: Two times a day (BID) | ORAL | Status: AC
Start: 2024-10-09 — End: 2024-10-14
  Administered 2024-10-09 – 2024-10-14 (×11): 100 mg via ORAL
  Filled 2024-10-09 (×12): qty 1

## 2024-10-09 MED ORDER — HYDROCODONE-ACETAMINOPHEN 5-325 MG PO TABS
1.0000 | ORAL_TABLET | Freq: Three times a day (TID) | ORAL | Status: DC | PRN
  Administered 2024-10-09 – 2024-10-14 (×4): 1 via ORAL
  Filled 2024-10-09 (×4): qty 1

## 2024-10-09 NOTE — Progress Notes (Signed)
 Progress Note   Patient: Laura Hancock FMW:[id number] DOB: [birth year] DOA: 10/08/2024     1 DOS: the patient was seen and examined on 10/09/2024   Brief hospital course: HPI from admission: CANDELARIA PIES is a 89 y.o. female with medical history significant of CKD stage IIIa, hyperlipidemia, generalized anxiety disorder, insomnia and carotid stenosis presented to emergency department complaining of cough with frequent urination and hip joint pain. Per patient she probably of some kind of mechanical fall on Monday.  She has been also taking Levaquin for possible pneumonia.  However today she has increased confusion and seen by primary care doctor for evaluation.  Patient lives a friend's home.  She is complaining about left-sided hip joint pain with ambulation with head and discomfort.  Also complaining of abdominal pain.  Denies any fever, chill, nausea, vomiting constipation diarrhea.  Reporting having productive cough. Daughter reported patient has a fall at home however she did not hit her head during the fall.   Patient is complaining about ongoing cough for more than 2 weeks recently completed treatment with Levaquin without any improvement. ... See H&P for full HPI on admission & ED course.  Patient was found to have multifocal pneumonia, a small acute PE in the RLL, possible colitis of the transverse colon and splenic flexure and prominent common bile duct up to 10 mm.  After extensive discussion about risks vs benefits of anticoagulation, family agreed and pt was started on IV heparin.    Pt being treated with IV antibiotics for sepsis due to pneumonia and possible colitis, IV heparin for acute PE.  Further hospital course and management as outlined below.     Assessment and Plan:  Severe Sepsis secondary to pneumonia Community-acquired pneumonia Acute hypoxic respiratory failure Acute metabolic encephalopathy-in terms of confusion in the setting of pneumonia Severe sepsis  evidenced by tachypnea and leukocytosis in setting of pneumonia and colitis.  Elevated lactic acid and acute hypoxic respiratory failure consistent with organ dysfunction. CTA chest showed small nonocclusive PE with right ventricular strain, multifocal pneumonia.  - In the ED patient received 2 L of NS and LR bolus and currently on maintenance fluid LR 150 cc/h.  Also received ceftriaxone  and azithromycin . -- Continue IV doxycycline  and IV Zosyn  -- Following blood culture, sputum culture, urine Legionella antigen, urine strep antigen and respiratory panel.   --Trend lactate --Monitor fever curve, CBC, hemodynamics --Symptomatic care per orders PRN     Acute pulmonary embolism CTA chest showed small nonocclusive PE and right ventricular strain.   --Continue heparin drip --Follow pending echo --Follow pending BLE doppler U/S --Consider IVC filter --Need to discuss risks/benefits of long term anticoagulation with DOAC given falls history  Elevated troponin-secondary to demand ischemia in the context of sepsis, acute PE, hypoxia.  Elevated troponin 21.  EKG no acute ischemic changes.  No chest pain.  Less likely ACS.     Colitis -- transverse colon and splenic flexure. Pt free of abdominal pain or N/V/D -Continue IV antibiotics as above --Following blood cultures  --Cancel C. difficile and GI panel if no diarrhea in next 24 hours.   AKI on CKD stage III A AKI resolved with IV fluids on admission Creatinine 1.04 >> 0.74.   Prerenal acute kidney injury in the setting of sepsis.   --On sepsis IV fluids --Follow pending UA (family report pt history of recurrent UTI) --Monitor BMP   Thyroid  nodule - TSH and free T4 are normal. CT cervical spine showed 2.2  cm right thyroid  solid nodule. - Outpatient thyroid  ultrasound for characterization.       Fall at home Left-sided hip joint pain from fall Present after mechanical fall.   Daughter reported that nursing home states she did not hit  her head. CT abdomen pelvis no evidence of fracture or dislocation.  CT head no acute intracranial abnormality.  CT cervical spine no evidence of fracture or dislocation. -Fall precaution  --Ambulate with assistance. -PT/OT evaluations   Euvolemic hyponatremia - resolved Given IV fluids on admission --Monitor BMP    Mild transaminitis Dilation of the hepatic and pancreatic duct CT abdomen pelvis showing bile duct dilation 10 mm small pancreatic dilation.   Pt asymptomatic without abdominal pain or N/V, tolerating diet. Alk phos did rise 129 >> 299. T bili is normal. Discussed in detail with patient and daughter - will defer MRCP at this time, given there is no evidence of significant transaminitis and patient does not have any acute abdominal pain at this time there is no concern for cholelithiasis and acute cholecystitis. --Monitor CMP daily - Please refer to GI outpatient on discharge.     History of glucoma -- Continue eyedrops   Generalized anxiety disorder - Continue Celexa    Insomnia - Holding Ambien  given patient presenting with fall and high risk of recurrent fall.  --Melatonin as needed at bedtime.        Subjective: Pt seen with daughter at bedside this AM.  Pt drowsy, wakes to voice and stays engaged in conversation. Drifts to sleep after several minutes.  Pt denies abdominal pain, SOB, N/V, chest pain.  Overall feels okay.  No abdominal pain after meals, tolerated breakfast without issues.  No acute complaints or events otherwise reported.  Physical Exam: Vitals:   10/09/24 1100 10/09/24 1200 10/09/24 1300 10/09/24 1400  BP: (!) 121/45 (!) 115/53  (!) 173/51  Pulse: 85 77 74 90  Resp: (!) 32 (!) 37 (!) 34 20  Temp:  99.7 F (37.6 C)    TempSrc:  Oral    SpO2: 97% 97% 96% 92%  Weight:      Height:       General exam: awake, alert, no acute distress HEENT: atraumatic, clear conjunctiva, anicteric sclera, moist mucus membranes, hearing grossly normal   Respiratory system: CTAB diminished bases, no wheezes, rales or rhonchi, normal respiratory effort. Cardiovascular system: normal S1/S2, RRR, no pedal edema.   Gastrointestinal system: soft, NT, ND, no HSM felt, +bowel sounds. Central nervous system: A&O x2+. no gross focal neurologic deficits, normal speech Extremities: moves all, no edema, normal tone Skin: dry, intact, normal temperature Psychiatry: normal mood, congruent affect, judgement and insight appear normal   Data Reviewed:  Notable labs --   Glucose 115 Ca 8.2 Alk phos increased to 299 Albumin 2.8 AST 75 Normal ALT and T bili Lactic acid normalized to 0.9 WBC 19.5 Hbg 9.7  Normal TSH and free T4 Negative C diff  ECHO -- EF 55-60%, no LV rWMA's, grade 1 DD, RV systolic function and size are normal, mildly elevated PASP  Family Communication: daughter at bedside  Disposition: Status is: Inpatient Remains inpatient appropriate because:  on IV therapies, ongoing evaluation   Planned Discharge Destination: return to prior living environment     Time spent: 45 minutes  Author: Burnard DELENA Cunning, DO 10/09/2024 2:50 PM  For on call review [email or web address].

## 2024-10-09 NOTE — Progress Notes (Addendum)
 PHARMACY - ANTICOAGULATION CONSULT NOTE  Pharmacy Consult for heparin Indication: pulmonary embolus  No Known Allergies  Patient Measurements: Height: 5' 3 (160 cm) Weight: 48.5 kg (107 lb) IBW/kg (Calculated) : 52.4 HEPARIN DW (KG): 48.5  Vital Signs: Temp: 98.7 F (37.1 C) (10/29 0400) Temp Source: Oral (10/29 0400) BP: 122/47 (10/29 0700) Pulse Rate: 82 (10/29 0700)  Labs: Recent Labs    10/08/24 1635 10/09/24 0329 10/09/24 0758  HGB 11.3* 9.7*  --   HCT 35.3* 29.3*  --   PLT 291 239  --   LABPROT  --  15.5*  --   INR  --  1.2  --   HEPARINUNFRC  --   --  <0.10*  CREATININE 1.04* 0.74  --     Estimated Creatinine Clearance: 30.1 mL/min (by C-G formula based on SCr of 0.74 mg/dL).   Medical History: Past Medical History:  Diagnosis Date   Arthritis    Back pain    Chronic kidney disease, stage 3a (HCC)    Insomnia    Leg pain    Neck pain      Assessment: 89 yo female with single small nonocclusive PE with R heart strain, pharmacy to dose heparin drip, no prior AC noted  10/09/2024 First heparin level undetectable after 1500 unit bolus and drip at 700 units/hr - heparin drip infusing well per RN (Zosyn & KVO IVF infiltrated)  Hg 11.3> 9.7 probably dehydrated on admission, Hg 02/24/23 = 10.4  PLT WNL No bleeding reported SCr 1.04>> 0.74  Goal of Therapy:  Heparin level 0.3-0.7 units/ml Monitor platelets by anticoagulation protocol: Yes   Plan:  Repeat Heparin bolus 1500 units x 1 Increase heparin drip to 850 units/hr Heparin level 8 hours after bolus & rate increase Daily CBC & heparin level F/u transition to DOAC  Rosaline IVAR Edison, Pharm.D Use secure chat for questions 10/09/2024 9:03 AM

## 2024-10-09 NOTE — Evaluation (Signed)
 Physical Therapy Evaluation Patient Details Name: Laura Hancock MRN: [id number] DOB: 01/14/1924 Today's Date: 10/09/2024  History of Present Illness  Patient is a 89 year old female who presented from ILF after a fall  a few days prior, complains of hip left pain,small nonocclusive PE,  treated for possible pneumonia,  UTI symptoms,AKI. PMH: back pain, CKD, insomnia, RTHA, periprosthetic femur fx  Clinical Impression  Pt admitted with above diagnosis.  Pt currently with functional limitations due to the deficits listed below (see PT Problem List). Pt will benefit from acute skilled PT to increase their independence and safety with mobility to allow discharge.       The patient   reports feeling tired, wants to have breakfast and sleep. Patient min assist to pivot to  recliner with HHA,  Patient maintained  3 L. SPO2 ?93 % HR 83, RR noted 42.  Patient resides in ILF, uses rollator at baseline. Does not drive. Patient may benefit from SNF rehab  prior to return to  ILF. Patient will benefit from continued inpatient follow up therapy, <3 hours/day.  Noted vestibular eval order, patient did not report any dizziness, no formal W/U indicated at this time.    If plan is discharge home, recommend the following: A lot of help with walking and/or transfers;A little help with bathing/dressing/bathroom;Assistance with cooking/housework;Assist for transportation;Help with stairs or ramp for entrance   Can travel by private vehicle   No    Equipment Recommendations None recommended by PT  Recommendations for Other Services       Functional Status Assessment Patient has had a recent decline in their functional status and demonstrates the ability to make significant improvements in function in a reasonable and predictable amount of time.     Precautions / Restrictions Precautions Precautions: Fall Restrictions Weight Bearing Restrictions Per Provider Order: No      Mobility  Bed  Mobility Overal bed mobility: Needs Assistance Bed Mobility: Supine to Sit     Supine to sit: Min assist     General bed mobility comments: assist with trunk    Transfers Overall transfer level: Needs assistance Equipment used: 1 person hand held assist Transfers: Sit to/from Stand, Bed to chair/wheelchair/BSC Sit to Stand: Min assist   Step pivot transfers: Min assist       General transfer comment: assist to stand and steady to step to recliner.    Ambulation/Gait                  Stairs            Wheelchair Mobility     Tilt Bed    Modified Rankin (Stroke Patients Only)       Balance                                             Pertinent Vitals/Pain Pain Assessment Pain Assessment: Faces Faces Pain Scale: Hurts little more Pain Location: left hip Pain Descriptors / Indicators: Discomfort Pain Intervention(s): Limited activity within patient's tolerance, Monitored during session    Home Living Family/patient expects to be discharged to:: Private residence (ILF) Living Arrangements: Alone Available Help at Discharge: Family Type of Home: Independent living facility Home Access: Level entry       Home Layout: One level Home Equipment: Rollator (4 wheels)      Prior Function Prior Level of Function :  Independent/Modified Independent             Mobility Comments: [place]depend[place] w/ rollator, ambulated to Dining, breakfast in apt. does not drive       Extremity/Trunk Assessment        Lower Extremity Assessment Lower Extremity Assessment: Generalized weakness    Cervical / Trunk Assessment Cervical / Trunk Assessment: Kyphotic  Communication   Communication Communication: Impaired Factors Affecting Communication: Hearing impaired    Cognition Arousal: Alert Behavior During Therapy: WFL for tasks assessed/performed   PT - Cognitive impairments: Orientation                       PT -  Cognition Comments: place- Physical therapy place, O to Oct, 25, able to provide info PLOF Following commands: Intact       Cueing       General Comments      Exercises     Assessment/Plan    PT Assessment Patient needs continued PT services  PT Problem List Decreased strength;Decreased activity tolerance;Decreased balance;Decreased mobility;Decreased safety awareness;Pain       PT Treatment Interventions DME instruction;Gait training;Functional mobility training;Therapeutic activities;Therapeutic exercise;Patient/family education    PT Goals (Current goals can be found in the Care Plan section)  Acute Rehab PT Goals Patient Stated Goal: return  to apt PT Goal Formulation: With patient Time For Goal Achievement: 10/23/24 Potential to Achieve Goals: Good    Frequency Min 2X/week     Co-evaluation               AM-PAC PT 6 Clicks Mobility  Outcome Measure Help needed turning from your back to your side while in a flat bed without using bedrails?: A Little Help needed moving from lying on your back to sitting on the side of a flat bed without using bedrails?: A Little Help needed moving to and from a bed to a chair (including a wheelchair)?: A Lot Help needed standing up from a chair using your arms (e.g., wheelchair or bedside chair)?: A Lot Help needed to walk in hospital room?: Total Help needed climbing 3-5 steps with a railing? : Total 6 Click Score: 12    End of Session Equipment Utilized During Treatment: Oxygen Activity Tolerance: Patient limited by fatigue Patient left: in chair;with call bell/phone within reach;with chair alarm set Nurse Communication: Mobility status PT Visit Diagnosis: Unsteadiness on feet (R26.81)    Time: 9188-9165 PT Time Calculation (min) (ACUTE ONLY): 23 min   Charges:   PT Evaluation $PT Eval Low Complexity: 1 Low   PT General Charges $$ ACUTE PT VISIT: 1 Visit         Darice Potters PT Acute Rehabilitation  Services Office [phone]   Potters Darice Norris 10/09/2024, 10:04 AM

## 2024-10-09 NOTE — Progress Notes (Signed)
 OT Cancellation Note  Patient Details Name: Laura Hancock MRN: [id number] DOB: October 10, 1924   Cancelled Treatment:    Reason Eval/Treat Not Completed: Other (comment). OT attempted to complete initial evaluation x2. During the first attempt, the nurse was in the room and attempting to obtain IV access. During the 2nd attempt, the nurse stated the pt was preparing to have a test performed. Will follow-up another day.   [name], OTR/L 10/09/2024, 5:32 PM

## 2024-10-09 NOTE — Progress Notes (Signed)
 PHARMACY - ANTICOAGULATION CONSULT NOTE  Pharmacy Consult for heparin Indication: pulmonary embolus  No Known Allergies  Patient Measurements: Height: 5' 3 (160 cm) Weight: 48.5 kg (107 lb) IBW/kg (Calculated) : 52.4 HEPARIN DW (KG): 48.5  Vital Signs: Temp: 99.7 F (37.6 C) (10/29 1200) Temp Source: Oral (10/29 1200) BP: 115/53 (10/29 1200) Pulse Rate: 77 (10/29 1200)  Labs: Recent Labs    10/08/24 1635 10/09/24 0329 10/09/24 0758  HGB 11.3* 9.7*  --   HCT 35.3* 29.3*  --   PLT 291 239  --   LABPROT  --  15.5*  --   INR  --  1.2  --   HEPARINUNFRC  --   --  <0.10*  CREATININE 1.04* 0.74  --     Estimated Creatinine Clearance: 30.1 mL/min (by C-G formula based on SCr of 0.74 mg/dL).   Medical History: Past Medical History:  Diagnosis Date   Arthritis    Back pain    Chronic kidney disease, stage 3a (HCC)    Insomnia    Leg pain    Neck pain      Assessment: 89 yo female with single small nonocclusive PE with R heart strain, pharmacy to dose heparin drip, no prior AC noted  10/09/2024 Heparin level 0.13, remains subtherapeutic on heparin 850 units/hr No bleeding or complications reported per RN.  No IV interruptions per RN.   Goal of Therapy:  Heparin level 0.3-0.7 units/ml Monitor platelets by anticoagulation protocol: Yes   Plan:  Give heparin 1500 units bolus IV x 1 Increase to heparin IV infusion at 1000 units/hr Heparin level 8 hours after rate change Daily CBC & heparin level   Wanda Hasting PharmD, BCPS [facility] main pharmacy [phone] 10/09/2024 6:49 PM

## 2024-10-10 DIAGNOSIS — J189 Pneumonia, unspecified organism: Secondary | ICD-10-CM | POA: Diagnosis not present

## 2024-10-10 DIAGNOSIS — A419 Sepsis, unspecified organism: Secondary | ICD-10-CM | POA: Diagnosis not present

## 2024-10-10 LAB — COMPREHENSIVE METABOLIC PANEL WITH GFR
ALT: 65 U/L — ABNORMAL HIGH (ref 0–44)
AST: 89 U/L — ABNORMAL HIGH (ref 15–41)
Albumin: 3 g/dL — ABNORMAL LOW (ref 3.5–5.0)
Alkaline Phosphatase: 140 U/L — ABNORMAL HIGH (ref 38–126)
Anion gap: 14 (ref 5–15)
BUN: 10 mg/dL (ref 8–23)
CO2: 20 mmol/L — ABNORMAL LOW (ref 22–32)
Calcium: 9 mg/dL (ref 8.9–10.3)
Chloride: 105 mmol/L (ref 98–111)
Creatinine, Ser: 0.78 mg/dL (ref 0.44–1.00)
GFR, Estimated: >60 mL/min (ref >60)
Glucose, Bld: 117 mg/dL — ABNORMAL HIGH (ref 70–99)
Potassium: 3.3 mmol/L — ABNORMAL LOW (ref 3.5–5.1)
Sodium: 139 mmol/L (ref 135–145)
Total Bilirubin: 0.6 mg/dL (ref 0.0–1.2)
Total Protein: 6.3 g/dL — ABNORMAL LOW (ref 6.5–8.1)

## 2024-10-10 LAB — GASTROINTESTINAL PANEL BY PCR, STOOL (REPLACES STOOL CULTURE)

## 2024-10-10 LAB — CBC
HCT: 35.8 % — ABNORMAL LOW (ref 36.0–46.0)
Hemoglobin: 11.3 g/dL — ABNORMAL LOW (ref 12.0–15.0)
MCH: 33.3 pg (ref 26.0–34.0)
MCHC: 31.6 g/dL (ref 30.0–36.0)
MCV: 105.6 fL — ABNORMAL HIGH (ref 80.0–100.0)
Platelets: 306 K/uL (ref 150–400)
RBC: 3.39 MIL/uL — ABNORMAL LOW (ref 3.87–5.11)
RDW: 12.1 % (ref 11.5–15.5)
WBC: 15.6 K/uL — ABNORMAL HIGH (ref 4.0–10.5)
nRBC: 0 % (ref 0.0–0.2)

## 2024-10-10 LAB — MAGNESIUM: Magnesium: 2 mg/dL (ref 1.7–2.4)

## 2024-10-10 LAB — HEPARIN LEVEL (UNFRACTIONATED): Heparin Unfractionated: 0.37 [IU]/mL (ref 0.30–0.70)

## 2024-10-10 MED ORDER — APIXABAN 5 MG PO TABS
10.0000 mg | ORAL_TABLET | Freq: Two times a day (BID) | ORAL | Status: DC
  Administered 2024-10-10 – 2024-10-15 (×10): 10 mg via ORAL
  Filled 2024-10-10 (×11): qty 2

## 2024-10-10 MED ORDER — POTASSIUM CHLORIDE CRYS ER 20 MEQ PO TBCR
40.0000 meq | EXTENDED_RELEASE_TABLET | Freq: Once | ORAL | Status: AC
  Administered 2024-10-10: 40 meq via ORAL
  Filled 2024-10-10: qty 2

## 2024-10-10 MED ORDER — APIXABAN 5 MG PO TABS: 5.0000 mg | ORAL_TABLET | Freq: Two times a day (BID) | ORAL | Status: DC

## 2024-10-10 NOTE — Progress Notes (Signed)
 OT Cancellation Note  Patient Details Name: KATHERINNE MOFIELD MRN: [id number] DOB: January 20, 1924   Cancelled Treatment:    Reason Eval/Treat Not Completed: Fatigue/lethargy limiting ability to participate  OT attempted eval this am and patient asleep. Re-attempt and patient had just gotten back to bed as per nursing and patient responded not today while dozing. OT will continue to follow for evaluation.   Jaymz Traywick OT/L Acute Rehabilitation Department  [phone]    10/10/2024, 3:35 PM

## 2024-10-10 NOTE — Progress Notes (Signed)
 PHARMACY - ANTICOAGULATION CONSULT NOTE  Pharmacy Consult for heparin Indication: pulmonary embolus  No Known Allergies  Patient Measurements: Height: 5' 3 (160 cm) Weight: 48.5 kg (107 lb) IBW/kg (Calculated) : 52.4 HEPARIN DW (KG): 48.5  Vital Signs: Temp: 98.6 F (37 C) (10/30 0900) Temp Source: Oral (10/30 0900) BP: 124/63 (10/30 0800) Pulse Rate: 87 (10/30 0800)  Labs: Recent Labs    10/08/24 1635 10/09/24 0329 10/09/24 0758 10/09/24 1903 10/10/24 0742  HGB 11.3* 9.7*  --   --  11.3*  HCT 35.3* 29.3*  --   --  35.8*  PLT 291 239  --   --  306  LABPROT  --  15.5*  --   --   --   INR  --  1.2  --   --   --   HEPARINUNFRC  --   --  <0.10* 0.13* 0.37  CREATININE 1.04* 0.74  --   --  0.78    Estimated Creatinine Clearance: 30.1 mL/min (by C-G formula based on SCr of 0.78 mg/dL).   Medical History: Past Medical History:  Diagnosis Date   Arthritis    Back pain    Chronic kidney disease, stage 3a (HCC)    Insomnia    Leg pain    Neck pain      Assessment: 89 yo female with single small nonocclusive PE with R heart strain, pharmacy to dose heparin drip, no prior AC noted  10/10/2024 Heparin level now therapeutic after bolus given and rate increased from 850 to 1000 units/hr last night CBC: Hgb improved, Plts WNL stable No bleeding or complications reported per RN.  No IV interruptions per RN.   Goal of Therapy:  Heparin level 0.3-0.7 units/ml Monitor platelets by anticoagulation protocol: Yes   Plan:  Continue current IV heparin rate of 1000 units/hr Recheck heparin level in 8 hours for confirmation Daily CBC & heparin level   Eva CHRISTELLA Allis, PharmD, BCPS Secure Chat if ?s 10/10/2024 10:25 AM

## 2024-10-10 NOTE — Progress Notes (Signed)
 Progress Note   Patient: Laura Hancock FMW:[id number] DOB: 1924-11-11 DOA: 10/08/2024     2 DOS: the patient was seen and examined on 10/10/2024   Brief hospital course: HPI from admission: Laura Hancock is a 89 y.o. female with medical history significant of CKD stage IIIa, hyperlipidemia, generalized anxiety disorder, insomnia and carotid stenosis presented to emergency department complaining of cough with frequent urination and hip joint pain. Per patient she probably of some kind of mechanical fall on Monday.  She has been also taking Levaquin for possible pneumonia.  However today she has increased confusion and seen by primary care doctor for evaluation.  Patient lives a friend's home.  She is complaining about left-sided hip joint pain with ambulation with head and discomfort.  Also complaining of abdominal pain.  Denies any fever, chill, nausea, vomiting constipation diarrhea.  Reporting having productive cough. Daughter reported patient has a fall at home however she did not hit her head during the fall.   Patient is complaining about ongoing cough for more than 2 weeks recently completed treatment with Levaquin without any improvement. ... See H&P for full HPI on admission & ED course.  Patient was found to have multifocal pneumonia, a small acute PE in the RLL, possible colitis of the transverse colon and splenic flexure and prominent common bile duct up to 10 mm.  After extensive discussion about risks vs benefits of anticoagulation, family agreed and pt was started on IV heparin.    Pt being treated with IV antibiotics for sepsis due to pneumonia and possible colitis, IV heparin for acute PE.  Further hospital course and management as outlined below.     Assessment and Plan:  Severe Sepsis secondary to pneumonia Community-acquired pneumonia Acute hypoxic respiratory failure Acute metabolic encephalopathy-in terms of confusion in the setting of pneumonia Severe sepsis  evidenced by tachypnea and leukocytosis in setting of pneumonia and colitis.  Elevated lactic acid and acute hypoxic respiratory failure consistent with organ dysfunction. CTA chest showed small nonocclusive PE with right ventricular strain, multifocal pneumonia.  - In the ED patient received 2 L of NS and LR bolus and currently on maintenance fluid LR 150 cc/h.  Also received ceftriaxone  and azithromycin . Viral swabs were negative. -- Continue IV doxycycline  and IV Zosyn  -- Following blood culture, sputum culture --Trend lactate --Monitor fever curve, CBC, hemodynamics --Symptomatic care per orders PRN --SLP evaluation for swallow --Incentive spirometry and flutter valve   Acute pulmonary embolism CTA chest showed small nonocclusive PE and right ventricular strain.   No DVT bilaterally on venous Doppler US  -- Will transition from heparin drip to Eliquis this evening --Discussed risks/benefits at length with patient and daughter, they want pt to be on anticoagulation.  Discussed importance of diligent fall precautions.  Left lower extremity SVT -will left small saphenous superficial venous thrombus seen on ultrasound.  Noted.  Hypokalemia -K3.3 -- Replace orally with 40 mill equivalents  Elevated troponin-secondary to demand ischemia in the context of sepsis, acute PE, hypoxia.  Elevated troponin 21.  EKG no acute ischemic changes.  No chest pain.  Less likely ACS.     Colitis -- transverse colon and splenic flexure. Pt has some mild mid upper abdominal discomfort, continues to tolerate diet well. Negative C. difficile and GI panel -Continue IV antibiotics as above --Following blood cultures     AKI on CKD stage III A AKI resolved with IV fluids on admission Creatinine 1.04 >> 0.74.   Prerenal acute kidney injury  in the setting of sepsis.   --On sepsis IV fluids --Follow pending UA (family report pt history of recurrent UTI) --Monitor BMP   Thyroid  nodule - TSH and free T4 are  normal. CT cervical spine showed 2.2 cm right thyroid  solid nodule. - Outpatient thyroid  ultrasound for characterization.       Fall at home Left-sided hip joint pain from fall Present after mechanical fall.   Daughter reported that nursing home states she did not hit her head. CT abdomen pelvis no evidence of fracture or dislocation.  CT head no acute intracranial abnormality.  CT cervical spine no evidence of fracture or dislocation. -Fall precaution  --Ambulate with assistance. -PT/OT evaluations   Euvolemic hyponatremia - resolved Given IV fluids on admission --Monitor BMP    Mild transaminitis Dilation of the hepatic and pancreatic duct CT abdomen pelvis showing bile duct dilation 10 mm small pancreatic dilation.   Pt asymptomatic without abdominal pain or N/V, tolerating diet. Alk phos did rise 129 >> 299. T bili is normal. Discussed in detail with patient and daughter - will defer MRCP at this time, given there is no evidence of significant transaminitis and patient does not have any acute abdominal pain at this time there is no concern for cholelithiasis and acute cholecystitis. --Monitor CMP daily - Please refer to GI outpatient on discharge.     History of glucoma -- Continue eyedrops   Generalized anxiety disorder - Continue Celexa    Insomnia - Home Ambien  was held on admission given fall and high risk of recurrence, resumed at request of pt and family --Melatonin dc'd        Subjective: Pt seen with daughter at bedside this AM.  Patient recovering from a coughing fit after she got choked while swallowing.  She and daughter report this is not a frequent issue for her.  She has never had any issues swallowing.  Patient denies feeling short of breath or having chest pain.  She had some upper mid abdominal pain without nausea or vomiting.  Tolerating diet.  No other acute complaints.   Physical Exam: Vitals:   10/10/24 1100 10/10/24 1200 10/10/24 1300 10/10/24  1310  BP: (!) 110/38 (!) 116/98 (!) 126/105 (!) 132/46  Pulse: 92 94 83 82  Resp: (!) 33 20 (!) 30 (!) 35  Temp:  99.3 F (37.4 C)    TempSrc:  Oral    SpO2: 91% 95% 93% 93%  Weight:      Height:       General exam: awake, alert, no acute distress HEENT: moist mucus membranes, hearing grossly normal  Respiratory system: CTAB with coarse upper airway sounds, no expiratory wheezes, rales or rhonchi, tachypneic but otherwise appears normal respiratory effort Cardiovascular system: normal S1/S2, RRR, no pedal edema.   Gastrointestinal system: soft, mild epigastric tenderness without rebound or guarding, nondistended Central nervous system: A&O x2+. no gross focal neurologic deficits, normal speech Extremities: moves all, no edema, normal tone Skin: dry, intact, normal temperature Psychiatry: normal mood, congruent affect, judgement and insight appear normal   Data Reviewed:  Notable labs --   K3.3 Bicarb 20 Glucose 117 Ca 8.2 Alk phos improved 299 >> 140 Albumin 3.0 AST 75 >> 89 ALT 65 Normal T. Bili  WBC 19.5 >> 0.6 Hbg 9.7 >> 11.3  Normal TSH and free T4 Negative C diff  ECHO -- EF 55-60%, no LV rWMA's, grade 1 DD, RV systolic function and size are normal, mildly elevated PASP  Lower extremity venous  Doppler ultrasound --negative for DVTs bilaterally, showed a left lower extremity superficial venous thrombus in the small saphenous vein   Family Communication: daughter at bedside on rounds 10/29, 10/30  Disposition: Status is: Inpatient Remains inpatient appropriate because:  on IV therapies, ongoing evaluation   Planned Discharge Destination: return to prior living environment     Time spent: 45 minutes  Author: Burnard DELENA Cunning, DO 10/10/2024 3:20 PM  For on call review [email or web address].

## 2024-10-10 NOTE — Discharge Instructions (Addendum)
 Information on my medicine - ELIQUIS (apixaban)  Why was Eliquis prescribed for you? Eliquis was prescribed to treat blood clots that may have been found in the veins of your legs (deep vein thrombosis) or in your lungs (pulmonary embolism) and to reduce the risk of them occurring again.  What do You need to know about Eliquis ? The starting dose is 10 mg (two 5 mg tablets) taken TWICE daily for the FIRST SEVEN (7) DAYS, then on 11/6 evening  the dose is reduced to ONE 5 mg tablet taken TWICE daily.  Eliquis may be taken with or without food.   Try to take the dose about the same time in the morning and in the evening. If you have difficulty swallowing the tablet whole please discuss with your pharmacist how to take the medication safely.  Take Eliquis exactly as prescribed and DO NOT stop taking Eliquis without talking to the doctor who prescribed the medication.  Stopping may increase your risk of developing a new blood clot.  Refill your prescription before you run out.  After discharge, you should have regular check-up appointments with your healthcare provider that is prescribing your Eliquis.    What do you do if you miss a dose? If a dose of ELIQUIS is not taken at the scheduled time, take it as soon as possible on the same day and twice-daily administration should be resumed. The dose should not be doubled to make up for a missed dose.  Important Safety Information A possible side effect of Eliquis is bleeding. You should call your healthcare provider right away if you experience any of the following: Bleeding from an injury or your nose that does not stop. Unusual colored urine (red or dark brown) or unusual colored stools (red or black). Unusual bruising for unknown reasons. A serious fall or if you hit your head (even if there is no bleeding).  Some medicines may interact with Eliquis and might increase your risk of bleeding or clotting while on Eliquis. To help avoid  this, consult your healthcare provider or pharmacist prior to using any new prescription or non-prescription medications, including herbals, vitamins, non-steroidal anti-inflammatory drugs (NSAIDs) and supplements.  This website has more information on Eliquis (apixaban): http://www.eliquis.com/eliquis/home

## 2024-10-10 NOTE — TOC Initial Note (Signed)
 Transition of Care [facility]) - Initial/Assessment Note    Patient Details  Name: Laura Hancock MRN: [id number] Date of Birth: [birth year]  [facility] CM/SW Contact:    Jon ONEIDA Anon, RN Phone Number: 10/10/2024, 1:43 PM  Clinical Narrative:                 Pt is from Independent Living at [facility]. PT recommending STR at SNF at discharge. NCM spoke with pt daughter Lolah Coghlan via telephone at [phone] and discussed PT rec, pt daughter would prefer pt return to IL at time of DC. Pt daughter states pt uses walker to ambulate at baseline. Has caregivers that come in several times a week. States family will be able to provide transportation at DC. ICM will continue to follow.  Expected Discharge Plan: Home w Home Health Services Barriers to Discharge: Continued Medical Work up   Patient Goals and CMS Choice Patient states their goals for this hospitalization and ongoing recovery are:: Per pt daughter Marilynn Ekstein, she would prefer pt return to Independent Living not Skilled Nursing CMS Medicare.gov Compare Post Acute Care list provided to:: Patient Represenative (must comment) (Pt daughter) Choice offered to / list presented to : Adult Children [place] ownership interest in [facility].provided to:: Adult Children    Expected Discharge Plan and Services In-house Referral: NA Discharge Planning Services: CM Consult Post Acute Care Choice: Durable Medical Equipment, Home Health Living arrangements for the past 2 months: Independent Living Facility                 DME Arranged: N/A DME Agency: NA       HH Arranged: NA HH Agency: NA        Prior Living Arrangements/Services Living arrangements for the past 2 months: Independent Living Facility Lives with:: Self Patient language and need for interpreter reviewed:: Yes Do you feel safe going back to the place where you live?: Yes      Need for Family Participation in Patient Care: Yes  (Comment) Care giver support system in place?: Yes (comment) Current home services: DME, Homehealth aide [facility]) Criminal Activity/Legal Involvement Pertinent to Current Situation/Hospitalization: No - Comment as needed  Activities of Daily Living      Permission Sought/Granted Permission sought to share information with : Family Supports, Oceanographer granted to share information with : Yes, Verbal Permission Granted  Share Information with NAME: Honor, Frison (Daughter)  [phone]  Permission granted to share info w AGENCY: [facility]        Emotional Assessment Appearance:: Appears stated age Attitude/Demeanor/Rapport: Unable to Assess Affect (typically observed): Unable to Assess Orientation: : Oriented to Self, Oriented to Place, Oriented to  Time, Oriented to Situation Alcohol  / Substance Use: Not Applicable Psych Involvement: No (comment)  Admission diagnosis:  Acute pulmonary embolism (HCC) [I26.99] Multifocal pneumonia [J18.8] Sepsis, due to unspecified organism, unspecified whether acute organ dysfunction present (HCC) [A41.9] Other acute pulmonary embolism, unspecified whether acute cor pulmonale present (HCC) [I26.99] Patient Active Problem List   Diagnosis Date Noted   Sepsis due to pneumonia (HCC) 10/08/2024   Acute pulmonary embolism (HCC) 10/08/2024   Colitis 10/08/2024   Acute kidney injury superimposed on chronic kidney disease 10/08/2024   Transaminitis 10/08/2024   Hyponatremia 10/08/2024   Dilation of pancreatic duct 10/08/2024   Joint pain of left hip on movement 10/08/2024   Hyperlipidemia 10/08/2024   Generalized anxiety disorder 10/08/2024   Carotid stenosis 10/08/2024   Fall  at home, initial encounter 03/20/2023   Dysuria 03/17/2023   CKD (chronic kidney disease) stage 3, GFR 30-59 ml/min (HCC) 03/17/2023   Anemia 03/17/2023   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 [address] Phone: [phone] Fax: [phone]     Social Drivers of Health (SDOH) Social History: SDOH Screenings   Food Insecurity: No Food Insecurity (10/09/2024)  Housing: Low Risk  (10/09/2024)  Transportation Needs: No Transportation Needs (10/09/2024)  Utilities: Not At Risk (10/09/2024)  Tobacco Use: Low Risk  (10/08/2024)   SDOH Interventions:     Readmission Risk Interventions    10/10/2024    1:35 PM  Readmission Risk Prevention Plan  Post Dischage Appt Complete  Medication Screening Complete  Transportation Screening Complete

## 2024-10-11 DIAGNOSIS — J189 Pneumonia, unspecified organism: Secondary | ICD-10-CM | POA: Diagnosis not present

## 2024-10-11 DIAGNOSIS — A419 Sepsis, unspecified organism: Secondary | ICD-10-CM | POA: Diagnosis not present

## 2024-10-11 LAB — COMPREHENSIVE METABOLIC PANEL WITH GFR
ALT: 73 U/L — ABNORMAL HIGH (ref 0–44)
AST: 92 U/L — ABNORMAL HIGH (ref 15–41)
Albumin: 2.9 g/dL — ABNORMAL LOW (ref 3.5–5.0)
Alkaline Phosphatase: 123 U/L (ref 38–126)
Anion gap: 10 (ref 5–15)
BUN: 9 mg/dL (ref 8–23)
CO2: 24 mmol/L (ref 22–32)
Calcium: 8.9 mg/dL (ref 8.9–10.3)
Chloride: 106 mmol/L (ref 98–111)
Creatinine, Ser: 0.72 mg/dL (ref 0.44–1.00)
GFR, Estimated: >60 mL/min (ref >60)
Glucose, Bld: 133 mg/dL — ABNORMAL HIGH (ref 70–99)
Potassium: 3.6 mmol/L (ref 3.5–5.1)
Sodium: 141 mmol/L (ref 135–145)
Total Bilirubin: 0.5 mg/dL (ref 0.0–1.2)
Total Protein: 5.9 g/dL — ABNORMAL LOW (ref 6.5–8.1)

## 2024-10-11 LAB — CBC
HCT: 32.5 % — ABNORMAL LOW (ref 36.0–46.0)
Hemoglobin: 10.3 g/dL — ABNORMAL LOW (ref 12.0–15.0)
MCH: 32 pg (ref 26.0–34.0)
MCHC: 31.7 g/dL (ref 30.0–36.0)
MCV: 100.9 fL — ABNORMAL HIGH (ref 80.0–100.0)
Platelets: 329 K/uL (ref 150–400)
RBC: 3.22 MIL/uL — ABNORMAL LOW (ref 3.87–5.11)
RDW: 11.9 % (ref 11.5–15.5)
WBC: 14 K/uL — ABNORMAL HIGH (ref 4.0–10.5)
nRBC: 0 % (ref 0.0–0.2)

## 2024-10-11 LAB — MAGNESIUM: Magnesium: 1.7 mg/dL (ref 1.7–2.4)

## 2024-10-11 NOTE — Progress Notes (Addendum)
 Progress Note   Patient: Laura Hancock FMW:[id number] DOB: 06-Oct-1924 DOA: 10/08/2024     3 DOS: the patient was seen and examined on 10/11/2024   Brief hospital course: HPI from admission: Laura Hancock is a 89 y.o. female with medical history significant of CKD stage IIIa, hyperlipidemia, generalized anxiety disorder, insomnia and carotid stenosis presented to emergency department complaining of cough with frequent urination and hip joint pain. Per patient she probably of some kind of mechanical fall on Monday.  She has been also taking Levaquin for possible pneumonia.  However today she has increased confusion and seen by primary care doctor for evaluation.  Patient lives a friend's home.  She is complaining about left-sided hip joint pain with ambulation with head and discomfort.  Also complaining of abdominal pain.  Denies any fever, chill, nausea, vomiting constipation diarrhea.  Reporting having productive cough. Daughter reported patient has a fall at home however she did not hit her head during the fall.   Patient is complaining about ongoing cough for more than 2 weeks recently completed treatment with Levaquin without any improvement. ... See H&P for full HPI on admission & ED course.  Patient was found to have multifocal pneumonia, a small acute PE in the RLL, possible colitis of the transverse colon and splenic flexure and prominent common bile duct up to 10 mm.  After extensive discussion about risks vs benefits of anticoagulation, family agreed and pt was started on IV heparin.    Pt being treated with IV antibiotics for sepsis due to pneumonia and possible colitis, IV heparin for acute PE.  Further hospital course and management as outlined below.     Assessment and Plan:  Severe Sepsis secondary to pneumonia Community-acquired pneumonia Acute hypoxic respiratory failure Acute metabolic encephalopathy-in terms of confusion in the setting of pneumonia Severe sepsis  evidenced by tachypnea and leukocytosis in setting of pneumonia and colitis.  Elevated lactic acid and acute hypoxic respiratory failure consistent with organ dysfunction. CTA chest showed small nonocclusive PE with right ventricular strain, multifocal pneumonia.  - In the ED patient received 2 L of NS and LR bolus and currently on maintenance fluid LR 150 cc/h.  Also received ceftriaxone  and azithromycin . Viral swabs were negative. 10/31 --patient seems less tachypneic, remains stable on room air -- Continue IV doxycycline  and IV Zosyn  --May transition to oral antibiotics as soon as tomorrow if ongoing stability and further improvement -- Following blood culture, sputum culture --Trend lactate --Monitor fever curve, CBC, hemodynamics --Symptomatic care per orders PRN --SLP evaluation for swallow --Incentive spirometry and flutter valve  -- Stable for transfer out of stepdown to telemetry floor   Acute pulmonary embolism CTA chest showed small nonocclusive PE and right ventricular strain.   No DVT bilaterally on venous Doppler US  -- Initially on heparin drip  -- Transitioned to Eliquis 10/30 PM --Continue Eliquis 10 mg twice daily to complete 7 days followed by 5 mg twice daily --Discussed risks/benefits at length with patient and daughter, they want pt to be on anticoagulation.  Discussed importance of diligent fall precautions.  Left lower extremity SVT -will left small saphenous superficial venous thrombus seen on ultrasound.  Noted.  Hypokalemia -K3.3 on 10/30 was replaced -- Monitor BMP, replace K as needed  Elevated troponin-secondary to demand ischemia in the context of sepsis, acute PE, hypoxia.  Elevated troponin 21.  EKG no acute ischemic changes.  No chest pain.  Less likely ACS.     Colitis -- transverse  colon and splenic flexure. Pt has some mild mid upper abdominal discomfort, continues to tolerate diet well. Negative C. difficile and GI panel -Continue IV antibiotics  as above --Following blood cultures  -NGTD   AKI on CKD stage III A AKI resolved with IV fluids on admission Creatinine 1.04 >> 0.74.   Prerenal acute kidney injury in the setting of sepsis.   -- Completed sepsis IV fluids -- UA was ordered on admission but never collected.  Patient has no UTI symptoms --Monitor BMP   Thyroid  nodule - TSH and free T4 are normal. CT cervical spine showed 2.2 cm right thyroid  solid nodule. - Outpatient thyroid  ultrasound for characterization.       Fall at home Left-sided hip joint pain from fall Present after mechanical fall.   Daughter reported that nursing home states she did not hit her head. CT abdomen pelvis no evidence of fracture or dislocation.  CT head no acute intracranial abnormality.  CT cervical spine no evidence of fracture or dislocation. -Fall precaution  --Ambulate with assistance. -PT/OT evaluations --SNF recommended, TOC consulted   Euvolemic hyponatremia - resolved Given IV fluids on admission --Monitor BMP    Mild transaminitis Dilation of the hepatic and pancreatic duct CT abdomen pelvis showing bile duct dilation 10 mm small pancreatic dilation.   Pt asymptomatic without abdominal pain or N/V, tolerating diet. Alk phos did rise, peaking at 299 has since normalized T bili has remained normal. Discussed in detail with patient and daughter - will defer MRCP at this time, given there is no no clinical concern for cholelithiasis and acute cholecystitis at this time. --Monitor closely --Monitor CMP daily - Please refer to GI outpatient on discharge.     History of glucoma -- Continue eyedrops   Generalized anxiety disorder - Continue Celexa    Insomnia - Home Ambien  was held on admission given fall and high risk of recurrence, resumed at request of pt and family --Melatonin dc'd        Subjective: Pt was up in recliner, daughter at bedside when seen on rounds this morning.  Patient reports overall feeling better.   She has intermittent upper mid abdominal pain but no nausea or vomiting and tolerating diet well.  Denies shortness of breath, fevers chills.  Does have occasional cough but otherwise feeling well and hopes to go home soon.  She would prefer to return to her ILF apartment over going to rehab but states she wants to do what is best.   Physical Exam: Vitals:   10/11/24 0700 10/11/24 0800 10/11/24 0900 10/11/24 1000  BP: (!) 152/71 132/61 (!) 158/71 (!) 143/57  Pulse: 73 72 72 88  Resp: (!) 32 (!) 35 19 15  Temp:      TempSrc:      SpO2: 97% 93% 98% 95%  Weight:      Height:       General exam: awake, alert, no acute distress HEENT: moist mucus membranes, mildly hard of hearing Respiratory system: Lungs clear bilaterally, no expiratory wheezes or rhonchi, normal respiratory effort on room air, less tachypneic today Cardiovascular system: normal S1/S2, RRR, no pedal edema.   Gastrointestinal system: soft, nontender nondistended Central nervous system: A&O x2+. no gross focal neurologic deficits, normal speech Extremities: moves all, no edema, normal tone Skin: dry, intact, normal temperature Psychiatry: normal mood, congruent affect, judgement and insight appear normal   Data Reviewed:  Notable labs --   Glucose 133  Alk phos normalized Albumin 2.9 AST 75 >>  89 >> 92 ALT 65 >> 73 Normal T. Bili 0.5  WBC 19.5 >> 15.6 >> 14.0 Hbg 9.7 >> 11.3 >> 10.3    Normal TSH and free T4 Negative C diff  ECHO -- EF 55-60%, no LV rWMA's, grade 1 DD, RV systolic function and size are normal, mildly elevated PASP  Lower extremity venous Doppler ultrasound --negative for DVTs bilaterally, showed a left lower extremity superficial venous thrombus in the small saphenous vein   Family Communication: daughter at bedside on rounds 10/29, 10/30, 10/31  Disposition: Status is: Inpatient Remains inpatient appropriate because:  on IV therapies, ongoing evaluation   Planned Discharge  Destination: return to ALF versus SNF     Time spent: 42 minutes  Author: Burnard DELENA Cunning, DO 10/11/2024 12:19 PM  For on call review [email or web address].

## 2024-10-11 NOTE — NC FL2 (Addendum)
 Laura Hancock  MEDICAID FL2 LEVEL OF CARE FORM     IDENTIFICATION  Patient Name: Laura Hancock Birthdate: [birth year] Sex: female Admission Date (Current Location): 10/08/2024  [facility] and [place] Number:  Producer, Television/film/video and Address:  [facility],  [address], [place] [address]      Provider Number: [id number]  Attending Physician Name and Address:  Laura Burnard LABOR, DO  Relative Name and Phone Number:  Laura Hancock, Laura Hancock (Daughter)  [phone]    Current Level of Care: Hospital Recommended Level of Care: Skilled Nursing Facility Prior Approval Number:    Date Approved/Denied:   PASRR Number: [id number] Hancock  Discharge Plan: SNF    Current Diagnoses: Patient Active Problem List   Diagnosis Date Noted   Sepsis due to pneumonia (HCC) 10/08/2024   Acute pulmonary embolism (HCC) 10/08/2024   Colitis 10/08/2024   Acute kidney injury superimposed on chronic kidney disease 10/08/2024   Transaminitis 10/08/2024   Hyponatremia 10/08/2024   Dilation of pancreatic duct 10/08/2024   Joint pain of left hip on movement 10/08/2024   Hyperlipidemia 10/08/2024   Generalized anxiety disorder 10/08/2024   Carotid stenosis 10/08/2024   Fall at home, initial encounter 03/20/2023   Dysuria 03/17/2023   CKD (chronic kidney disease) stage 3, GFR 30-59 ml/min (HCC) 03/17/2023   Anemia 03/17/2023   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, sequela 02/24/2023   DNR (do not resuscitate)/DNI(Do Not Intubate) 02/24/2023   CAP (community acquired pneumonia) 02/24/2023   Insomnia 02/24/2023    Orientation RESPIRATION BLADDER Height & Weight     Self, Time, Situation, Place  Normal Continent Weight: 48.5 kg Height:  5' 3 (160 cm)  BEHAVIORAL SYMPTOMS/MOOD NEUROLOGICAL BOWEL NUTRITION STATUS      Continent Diet (Heart Healthy, Thin Liquids)  AMBULATORY STATUS COMMUNICATION OF NEEDS Skin   Limited Assist Verbally Normal                        Personal Care Assistance Level of Assistance  Bathing, Feeding, Dressing Bathing Assistance: Limited assistance Feeding assistance: Limited assistance Dressing Assistance: Limited assistance     Functional Limitations Info  Sight, Hearing, Speech Sight Info: Adequate Hearing Info: Impaired Speech Info: Adequate    SPECIAL CARE FACTORS FREQUENCY  PT (By licensed PT), OT (By licensed OT)     PT Frequency: 5x/wk OT Frequency: 5x/wk            Contractures Contractures Info: Not present    Additional Factors Info  Code Status, Allergies Code Status Info: DNR Allergies Info: No Known Allergies           Current Medications (10/12/2024):  This is the current hospital active medication list Current Facility-Administered Medications  Medication Dose Route Frequency Provider Last Rate Last Admin   acetaminophen  (TYLENOL ) tablet 650 mg  650 mg Oral Q6H PRN Sundil, Subrina, MD       Or   acetaminophen  (TYLENOL ) suppository 650 mg  650 mg Rectal Q6H PRN Sundil, Subrina, MD       albuterol (PROVENTIL) (2.5 MG/3ML) 0.083% nebulizer solution 2.5 mg  2.5 mg Nebulization Q4H PRN Sundil, Subrina, MD       amoxicillin-clavulanate (AUGMENTIN) 875-125 MG per tablet 1 tablet  1 tablet Oral Q12H Laura Burnard A, DO       apixaban (ELIQUIS) tablet 10 mg  10 mg Oral BID Laura Burnard A, DO   10 mg at 10/12/24 0609   Followed by   Laura Hancock ON 10/17/2024]  apixaban (ELIQUIS) tablet 5 mg  5 mg Oral BID Laura Sor A, DO       Chlorhexidine Gluconate Cloth 2 % PADS 6 each  6 each Topical Daily Sundil, Subrina, MD   6 each at 10/11/24 2011   citalopram  (CELEXA ) tablet 10 mg  10 mg Oral Daily Sundil, Subrina, MD   10 mg at 10/11/24 0857   doxycycline  (VIBRA -TABS) tablet 100 mg  100 mg Oral Q12H Laura Hancock Hancock, RPH   100 mg at 10/11/24 2000   feeding supplement (ENSURE PLUS HIGH PROTEIN) liquid 237 mL  237 mL Oral Q1400 Sundil, Subrina, MD   237 mL at 10/11/24 1349   guaiFENesin (MUCINEX)  12 hr tablet 600 mg  600 mg Oral BID Sundil, Subrina, MD   600 mg at 10/11/24 2000   HYDROcodone -acetaminophen  (NORCO/VICODIN) 5-325 MG per tablet 1 tablet  1 tablet Oral Q8H PRN Laura Sor A, DO   1 tablet at 10/11/24 1955   latanoprost  (XALATAN ) 0.005 % ophthalmic solution 1 drop  1 drop Both Eyes QHS Sundil, Subrina, MD   1 drop at 10/11/24 2000   melatonin tablet 5 mg  5 mg Oral QHS PRN Sundil, Subrina, MD   5 mg at 10/11/24 2326   ondansetron  (ZOFRAN ) tablet 4 mg  4 mg Oral Q6H PRN Sundil, Subrina, MD       Or   ondansetron  (ZOFRAN ) injection 4 mg  4 mg Intravenous Q6H PRN Sundil, Subrina, MD       potassium chloride SA (KLOR-CON M) CR tablet 40 mEq  40 mEq Oral Once Laura Sor LABOR, DO       senna (SENOKOT) tablet 8.6 mg  1 tablet Oral Daily PRN Sundil, Subrina, MD       sodium chloride  flush (NS) 0.9 % injection 3 mL  3 mL Intravenous Q12H Sundil, Subrina, MD   3 mL at 10/11/24 2009   sodium chloride  flush (NS) 0.9 % injection 3 mL  3 mL Intravenous Q12H Sundil, Subrina, MD   3 mL at 10/11/24 0857   sodium chloride  flush (NS) 0.9 % injection 3 mL  3 mL Intravenous PRN Sundil, Subrina, MD       zolpidem  (AMBIEN ) tablet 5 mg  5 mg Oral QHS Laura Sor A, DO   5 mg at 10/11/24 2000     Discharge Medications: Please see discharge summary for Hancock list of discharge medications.  Relevant Imaging Results:  Relevant Lab Results:   Additional Information SSN: [id number]  Laura Hancock Anon, RN

## 2024-10-11 NOTE — Evaluation (Signed)
 Clinical/Bedside Swallow Evaluation Patient Details  Name: Laura Hancock MRN: [id number] Date of Birth: [birth year]  Today's Date: 10/11/2024 Time: SLP Start Time (ACUTE ONLY): 9182 SLP Stop Time (ACUTE ONLY): 0853 SLP Time Calculation (min) (ACUTE ONLY): 36 min  Past Medical History:  Past Medical History:  Diagnosis Date   Arthritis    Back pain    Chronic kidney disease, stage 3a (HCC)    Insomnia    Leg pain    Neck pain    Past Surgical History: History reviewed. No pertinent surgical history. HPI:  Laura Hancock is a 89 y.o. female with medical history significant of CKD stage IIIa, hyperlipidemia, generalized anxiety disorder, insomnia and carotid stenosis presented to emergency department complaining of cough with frequent urination and hip joint pain. Admitting diagnoses of sepsis due to pneumonia.    Assessment / Plan / Recommendation  Clinical Impression   Pt presents with a grossly functional oropharyngeal swallow per clinical swallow assessment completed today; however, cannot completely rule out aspiration at bedside. Recommend continue regular diet and thin liquids as tolerated with SLP plan to follow up for diet tolerance assessment.   Pt does not have a hx of dysphagia per chart review and her report. She denies acute changes in swallow function at this time, though does report a non-productive cough, likely secondary to her pneumonia. Oral phase judged to be WNL. Pharyngeal swallow prompt with laryngeal elevation noted. Pt did exhibit immediate and delayed coughing following initial sips of thin water and initial bites of applesauce, though she endorses this is related to respiratory infection vs swallowing. We discussed option for MBSS to objectively assess her swallow function. Pt politely declined at this time and requested to monitor diet tolerance. She is open to a MBSS if further concerns arise with her eating and drinking during hospitalization. At this time, pt  remains on room air and her WBC is trending down.   Plan: SLP will follow up to assess diet tolerance. Will consider MBSS if further concerns for aspiration with PO intake arise.  SLP Visit Diagnosis:  (r/o dysphagia; aspiration risk in the setting of deconditioning and acute respiratory infection)    Aspiration Risk  Mild aspiration risk    Diet Recommendation Regular;Thin liquid    Liquid Administration via: Cup;Straw Medication Administration: Whole meds with liquid Supervision: Patient able to self feed Compensations: Slow rate;Small sips/bites Postural Changes: Seated upright at 90 degrees    Other  Recommendations Oral Care Recommendations: Oral care BID     Assistance Recommended at Discharge    Functional Status Assessment Patient has had a recent decline in their functional status and demonstrates the ability to make significant improvements in function in a reasonable and predictable amount of time.  Frequency and Duration min 1 x/week  1 week       Prognosis Prognosis for improved oropharyngeal function: Good      Swallow Study   General Date of Onset: 10/08/24 HPI: Laura Hancock is a 89 y.o. female with medical history significant of CKD stage IIIa, hyperlipidemia, generalized anxiety disorder, insomnia and carotid stenosis presented to emergency department complaining of cough with frequent urination and hip joint pain. Admitting diagnoses of sepsis due to pneumonia. Type of Study: Bedside Swallow Evaluation Previous Swallow Assessment: none per chart Diet Prior to this Study: Regular;Thin liquids (Level 0) Temperature Spikes Noted: No Respiratory Status: Room air History of Recent Intubation: No Behavior/Cognition: Alert;Cooperative;Pleasant mood Oral Cavity Assessment: Within Functional Limits Oral Cavity -  Dentition: Adequate natural dentition Vision: Functional for self-feeding Self-Feeding Abilities: Able to feed self Patient Positioning: Upright in  bed Baseline Vocal Quality: Normal Volitional Cough: Strong Volitional Swallow: Able to elicit    Oral/Motor/Sensory Function Overall Oral Motor/Sensory Function: Within functional limits   Ice Chips Ice chips: Not tested   Thin Liquid Thin Liquid: Within functional limits Presentation: Straw    Nectar Thick Nectar Thick Liquid: Not tested   Honey Thick Honey Thick Liquid: Not tested   Puree Puree: Within functional limits Presentation: Self Fed   Solid     Solid: Within functional limits Presentation: Self Fed      [employer] 10/11/2024,9:05 AM

## 2024-10-11 NOTE — Evaluation (Signed)
 Occupational Therapy Evaluation Patient Details Name: Laura Hancock MRN: [id number] DOB: [birth year] Today's Date: 10/11/2024   History of Present Illness   Patient is a 89 year old female admitted to the hospital with cough, increased urinary frequency, and hip joint pain. Patient was found to have small acute PE, possible pneumonia,  and  AKI. PMH: back pain, CKD, insomnia, RTHA, periprosthetic femur fx     Clinical Impressions The pt is currently presenting below her baseline level of functioning for self-care management. She is limited by the below listed deficits (see OT problem list). As such, her occupational performance is compromised and she requires assistance for self-care management. During the session today, she required min assist for sit to stand, to stand-pivot to and from the bedside commode, and for toileting management at bedside commode level. She will benefit from further OT services to maximize her independence with self-care tasks, to decrease the risk for restricted participation in meaningful activities, and to decrease the risk for further weakness and deconditioning. Patient will benefit from continued inpatient follow up therapy, <3 hours/day.      If plan is discharge home, recommend the following:   A little help with walking and/or transfers;A little help with bathing/dressing/bathroom;Assistance with cooking/housework;Assist for transportation     Functional Status Assessment   Patient has had a recent decline in their functional status and demonstrates the ability to make significant improvements in function in a reasonable and predictable amount of time.     Equipment Recommendations   None recommended by OT     Recommendations for Other Services         Precautions/Restrictions   Precautions Precautions: Fall Restrictions Weight Bearing Restrictions Per Provider Order: No     Mobility Bed Mobility Overal bed mobility: Needs  Assistance Bed Mobility: Sit to Supine       Sit to supine: Contact guard assist        Transfers Overall transfer level: Needs assistance Equipment used: Rolling walker (2 wheels) Transfers: Sit to/from Stand, Bed to chair/wheelchair/BSC Sit to Stand: Min assist Stand pivot transfers: Min assist         General transfer comment:  (Pt required min assist using a RW to stand-pivot from the chair to bedside commode then to the bed)      Balance     Sitting balance-Leahy Scale: Good         Standing balance comment: CGA to min assist with RW           ADL either performed or assessed with clinical judgement   ADL Overall ADL's : Needs assistance/impaired Eating/Feeding: Independent;Sitting   Grooming: Set up;Sitting   Upper Body Bathing: Set up;Supervision/ safety;Sitting   Lower Body Bathing: Minimal assistance;Sitting/lateral leans;Sit to/from stand   Upper Body Dressing : Set up;Supervision/safety;Sitting   Lower Body Dressing: Minimal assistance;Sitting/lateral leans;Sit to/from stand   Toilet Transfer: Minimal assistance;BSC/3in1;Rolling walker (2 wheels);Stand-pivot   Toileting- Clothing Manipulation and Hygiene: Minimal assistance;Sit to/from stand               Vision   Additional Comments: She correctly read the time depicted on the wall clock.            Pertinent Vitals/Pain Pain Assessment Pain Location: she reported a little abdominal pain Pain Intervention(s): Monitored during session, Repositioned     Extremity/Trunk Assessment Upper Extremity Assessment Upper Extremity Assessment: Right hand dominant;RUE deficits/detail;LUE deficits/detail RUE Deficits / Details: AROM WFL. Gross strength 4-/5 to 4/5 LUE Deficits /  Details: AROM WFL. Gross strength 4-/5 to 4/5   Lower Extremity Assessment Lower Extremity Assessment: Generalized weakness;RLE deficits/detail;LLE deficits/detail RLE Deficits / Details: AROM WFL LLE Deficits  / Details: AROM WFL       Communication Communication Communication: No apparent difficulties Factors Affecting Communication: Hearing impaired   Cognition Arousal: Alert Behavior During Therapy: WFL for tasks assessed/performed               OT - Cognition Comments: Oriented to person, place, month, year, and situation.            Following commands: Intact       Cueing  General Comments   Cueing Techniques: Verbal cues              Home Living Family/patient expects to be discharged to:: Other (Comment) ([facility], ILF) Living Arrangements: Alone   Type of Home: Independent living facility Home Access: Level entry     Home Layout: One level     Bathroom Shower/Tub: Producer, Television/film/video: Handicapped height     Home Equipment: Rollator (4 wheels)          Prior Functioning/Environment Prior Level of Function : Independent/Modified Independent             Mobility Comments:  (She used a rollator when ambulating outside of her room.) ADLs Comments:  (Modified independent to independent with ADLs and performed light meal prep. She eats most meals in the dining room.)    OT Problem List: Decreased strength;Decreased activity tolerance;Impaired balance (sitting and/or standing)   OT Treatment/Interventions: Self-care/ADL training;Therapeutic exercise;Therapeutic activities;Energy conservation;DME and/or AE instruction;Patient/family education      OT Goals(Current goals can be found in the care plan section)   Acute Rehab OT Goals OT Goal Formulation: With patient Time For Goal Achievement: 10/25/24 Potential to Achieve Goals: Good ADL Goals Pt Will Perform Grooming: with supervision;standing Pt Will Perform Lower Body Dressing: with supervision;sitting/lateral leans;sit to/from stand Pt Will Transfer to Toilet: with supervision;ambulating Pt Will Perform Toileting - Clothing Manipulation and hygiene: with  supervision;sit to/from stand   OT Frequency:  Min 2X/week       AM-PAC OT 6 Clicks Daily Activity     Outcome Measure Help from another person eating meals?: None Help from another person taking care of personal grooming?: A Little Help from another person toileting, which includes using toliet, bedpan, or urinal?: A Little Help from another person bathing (including washing, rinsing, drying)?: A Little Help from another person to put on and taking off regular upper body clothing?: A Little Help from another person to put on and taking off regular lower body clothing?: A Little 6 Click Score: 19   End of Session Equipment Utilized During Treatment: Rolling walker (2 wheels) Nurse Communication: Mobility status  Activity Tolerance: Patient tolerated treatment well Patient left: in bed;with call bell/phone within reach;with bed alarm set  OT Visit Diagnosis: Muscle weakness (generalized) (M62.81);Other abnormalities of gait and mobility (R26.89)                Time: 8669-8654 OT Time Calculation (min): 15 min Charges:  OT General Charges $OT Visit: 1 Visit OT Evaluation $OT Eval Moderate Complexity: 1 Mod    Laura Hancock, OTR/L 10/11/2024, 4:44 PM

## 2024-10-11 NOTE — Plan of Care (Signed)
  Problem: SLP Dysphagia Goals Goal: Misc Dysphagia Goal Flowsheets (Taken 10/11/2024 0858) Misc Dysphagia Goal: Pt will tolerate the least restrictive diet with no overt or subtle s/s of aspiration or decline in pulmonary function.

## 2024-10-11 NOTE — TOC Progression Note (Addendum)
 Transition of Care [facility]) - Progression Note    Patient Details  Name: Laura Hancock MRN: [id number] Date of Birth: 01-13-1924  [facility]) CM/SW Contact  Jon ONEIDA Anon, RN Phone Number: 10/11/2024, 3:56 PM  Clinical Narrative:     NCM spoke with pt daughter Willy and she states they would like pt to go to [facility] for Respite care. NCM spoke with [place] Nurse at [facility] at [phone] and she states they will need signed FL2 and most recent progress notes faxed to [phone]. ICM will continue to follow.   Addendum: 1607: NCM received call from [place] Nurse at [facility] stating that pt daughter Willy would like pt to go to [facility] for Skilled Nursing. Will need most recent progress notes and FL2 faxed.   Expected Discharge Plan: Home w Home Health Services Barriers to Discharge: Continued Medical Work up               Expected Discharge Plan and Services In-house Referral: NA Discharge Planning Services: CM Consult Post Acute Care Choice: Durable Medical Equipment, Home Health Living arrangements for the past 2 months: Independent Living Facility                 DME Arranged: N/A DME Agency: NA       HH Arranged: NA HH Agency: NA         Social Drivers of Health (SDOH) Interventions SDOH Screenings   Food Insecurity: No Food Insecurity (10/09/2024)  Housing: Low Risk  (10/09/2024)  Transportation Needs: No Transportation Needs (10/09/2024)  Utilities: Not At Risk (10/09/2024)  Tobacco Use: Low Risk  (10/08/2024)    Readmission Risk Interventions    10/10/2024    1:35 PM  Readmission Risk Prevention Plan  Post Dischage Appt Complete  Medication Screening Complete  Transportation Screening Complete

## 2024-10-11 NOTE — Progress Notes (Signed)
 Physical Therapy Treatment Patient Details Name: Laura Hancock MRN: [id number] DOB: 03-25-1924 Today's Date: 10/11/2024   History of Present Illness Patient is a 89 year old female who presented from ILF after a fall  a few days prior, complains of hip left pain,small nonocclusive PE,  treated for possible pneumonia,  UTI symptoms,AKI. PMH: back pain, CKD, insomnia, RTHA, periprosthetic femur fx    PT Comments  The patient dozes frequently when not   moving about. Patient moved to sitting with no assistance, min assistance to transfer to [facility] then step to recliner.  Patient noted to have 3-4/ dyspnea, RR 40.     SPo2 on RA 95%.  HR in 70's.   Patient's daughter present and declines SNF, PT recommend that patient will require 24/7 assistance. Dtr stated we will work something out. Patient will benefit from continued inpatient follow up therapy, <3 hours/day or HHPT if returns to ILF with 24/7.   If plan is discharge home, recommend the following: A lot of help with walking and/or transfers;A little help with bathing/dressing/bathroom;Assistance with cooking/housework;Assist for transportation;Help with stairs or ramp for entrance   Can travel by private vehicle     No  Equipment Recommendations  None recommended by PT    Recommendations for Other Services       Precautions / Restrictions Precautions Precautions: Fall Precaution/Restrictions Comments: wath sats Restrictions Weight Bearing Restrictions Per Provider Order: No     Mobility  Bed Mobility Overal bed mobility: Needs Assistance Bed Mobility: Supine to Sit     Supine to sit: Supervision          Transfers Overall transfer level: Needs assistance Equipment used: 1 person hand held assist Transfers: Sit to/from Stand, Bed to chair/wheelchair/BSC Sit to Stand: Min assist   Step pivot transfers: Min assist       General transfer comment: cues to step to [facility] then cues to step to recliner HHA ,noted increased  RR  and SOB    Ambulation/Gait                   Stairs             Wheelchair Mobility     Tilt Bed    Modified Rankin (Stroke Patients Only)       Balance Overall balance assessment: Needs assistance Sitting-balance support: No upper extremity supported, Feet supported Sitting balance-Leahy Scale: Good     Standing balance support: Bilateral upper extremity supported, During functional activity Standing balance-Leahy Scale: Poor                              Communication Communication Communication: Impaired Factors Affecting Communication: Hearing impaired  Cognition Arousal: Alert Behavior During Therapy: WFL for tasks assessed/performed   PT - Cognitive impairments: Orientation   Orientation impairments: Time, Situation                     Following commands: Intact      Cueing Cueing Techniques: Verbal cues, Tactile cues  Exercises General Exercises - Lower Extremity Long Arc Quad: AROM, Both, 10 reps Hip ABduction/ADduction: AROM, Both, 10 reps Hip Flexion/Marching: AROM, Both, 10 reps    General Comments        Pertinent Vitals/Pain Pain Assessment Pain Assessment: No/denies pain    Home Living  Prior Function            PT Goals (current goals can now be found in the care plan section) Progress towards PT goals: Progressing toward goals    Frequency    Min 2X/week      PT Plan      Co-evaluation              AM-PAC PT 6 Clicks Mobility   Outcome Measure  Help needed turning from your back to your side while in a flat bed without using bedrails?: A Little Help needed moving from lying on your back to sitting on the side of a flat bed without using bedrails?: A Little Help needed moving to and from a bed to a chair (including a wheelchair)?: A Lot Help needed standing up from a chair using your arms (e.g., wheelchair or bedside chair)?: A Lot Help  needed to walk in hospital room?: Total Help needed climbing 3-5 steps with a railing? : Total 6 Click Score: 12    End of Session   Activity Tolerance: Patient limited by fatigue Patient left: in chair;with call bell/phone within reach;with chair alarm set;with family/visitor present Nurse Communication: Mobility status PT Visit Diagnosis: Unsteadiness on feet (R26.81)     Time: 9053-8988 PT Time Calculation (min) (ACUTE ONLY): 25 min  Charges:    $Therapeutic Activity: 8-22 mins $Self Care/Home Management: 8-22 PT General Charges $$ ACUTE PT VISIT: 1 Visit                     Darice Potters PT Acute Rehabilitation Services Office [phone]    Potters Darice Norris 10/11/2024, 1:48 PM

## 2024-10-12 DIAGNOSIS — A419 Sepsis, unspecified organism: Secondary | ICD-10-CM | POA: Diagnosis not present

## 2024-10-12 DIAGNOSIS — J189 Pneumonia, unspecified organism: Secondary | ICD-10-CM | POA: Diagnosis not present

## 2024-10-12 LAB — COMPREHENSIVE METABOLIC PANEL WITH GFR
ALT: 59 U/L — ABNORMAL HIGH (ref 0–44)
AST: 53 U/L — ABNORMAL HIGH (ref 15–41)
Albumin: 2.7 g/dL — ABNORMAL LOW (ref 3.5–5.0)
Alkaline Phosphatase: 101 U/L (ref 38–126)
Anion gap: 12 (ref 5–15)
BUN: 10 mg/dL (ref 8–23)
CO2: 23 mmol/L (ref 22–32)
Calcium: 8.8 mg/dL — ABNORMAL LOW (ref 8.9–10.3)
Chloride: 105 mmol/L (ref 98–111)
Creatinine, Ser: 0.71 mg/dL (ref 0.44–1.00)
GFR, Estimated: >60 mL/min (ref >60)
Glucose, Bld: 116 mg/dL — ABNORMAL HIGH (ref 70–99)
Potassium: 3.4 mmol/L — ABNORMAL LOW (ref 3.5–5.1)
Sodium: 140 mmol/L (ref 135–145)
Total Bilirubin: 0.5 mg/dL (ref 0.0–1.2)
Total Protein: 5.8 g/dL — ABNORMAL LOW (ref 6.5–8.1)

## 2024-10-12 LAB — CBC
HCT: 31.3 % — ABNORMAL LOW (ref 36.0–46.0)
Hemoglobin: 10.3 g/dL — ABNORMAL LOW (ref 12.0–15.0)
MCH: 33.6 pg (ref 26.0–34.0)
MCHC: 32.9 g/dL (ref 30.0–36.0)
MCV: 102 fL — ABNORMAL HIGH (ref 80.0–100.0)
Platelets: 444 K/uL — ABNORMAL HIGH (ref 150–400)
RBC: 3.07 MIL/uL — ABNORMAL LOW (ref 3.87–5.11)
RDW: 12.2 % (ref 11.5–15.5)
WBC: 13.4 K/uL — ABNORMAL HIGH (ref 4.0–10.5)
nRBC: 0 % (ref 0.0–0.2)

## 2024-10-12 LAB — MAGNESIUM: Magnesium: 1.7 mg/dL (ref 1.7–2.4)

## 2024-10-12 MED ORDER — POTASSIUM CHLORIDE CRYS ER 20 MEQ PO TBCR
40.0000 meq | EXTENDED_RELEASE_TABLET | Freq: Once | ORAL | Status: AC
  Administered 2024-10-12: 40 meq via ORAL
  Filled 2024-10-12: qty 2

## 2024-10-12 MED ORDER — AMOXICILLIN-POT CLAVULANATE 875-125 MG PO TABS
1.0000 | ORAL_TABLET | Freq: Two times a day (BID) | ORAL | Status: AC
  Administered 2024-10-12 – 2024-10-14 (×6): 1 via ORAL
  Filled 2024-10-12 (×6): qty 1

## 2024-10-12 NOTE — Plan of Care (Signed)

## 2024-10-12 NOTE — Progress Notes (Signed)
 Progress Note   Patient: Laura Hancock FMW:[id number] DOB: 1924-02-24 DOA: 10/08/2024     4 DOS: the patient was seen and examined on 10/12/2024   Brief hospital course: HPI from admission: YOSSELYN TAX is a 89 y.o. female with medical history significant of CKD stage IIIa, hyperlipidemia, generalized anxiety disorder, insomnia and carotid stenosis presented to emergency department complaining of cough with frequent urination and hip joint pain. Per patient she probably of some kind of mechanical fall on Monday.  She has been also taking Levaquin for possible pneumonia.  However today she has increased confusion and seen by primary care doctor for evaluation.  Patient lives a friend's home.  She is complaining about left-sided hip joint pain with ambulation with head and discomfort.  Also complaining of abdominal pain.  Denies any fever, chill, nausea, vomiting constipation diarrhea.  Reporting having productive cough. Daughter reported patient has a fall at home however she did not hit her head during the fall.   Patient is complaining about ongoing cough for more than 2 weeks recently completed treatment with Levaquin without any improvement. ... See H&P for full HPI on admission & ED course.  Patient was found to have multifocal pneumonia, a small acute PE in the RLL, possible colitis of the transverse colon and splenic flexure and prominent common bile duct up to 10 mm.  After extensive discussion about risks vs benefits of anticoagulation, family agreed and pt was started on IV heparin.    Pt being treated with IV antibiotics for sepsis due to pneumonia and possible colitis, IV heparin for acute PE.  Further hospital course and management as outlined below.     Assessment and Plan:  Severe Sepsis secondary to pneumonia Community-acquired pneumonia Acute hypoxic respiratory failure Acute metabolic encephalopathy-in terms of confusion in the setting of pneumonia Severe sepsis  evidenced by tachypnea and leukocytosis in setting of pneumonia and colitis.  Elevated lactic acid and acute hypoxic respiratory failure consistent with organ dysfunction. CTA chest showed small nonocclusive PE with right ventricular strain, multifocal pneumonia.  - In the ED patient received 2 L of NS and LR bolus and currently on maintenance fluid LR 150 cc/h.  Also received ceftriaxone  and azithromycin . Viral swabs were negative. 10/31 --patient seems less tachypneic, remains stable on room air -- Transition IV doxycycline  Zosyn >> p.o. Doxy and p.o. Augmentin to complete 7-day course -- Following blood culture, sputum culture --Monitor fever curve, CBC, hemodynamics --Symptomatic care per orders PRN --SLP evaluation for swallow --Incentive spirometry and flutter valve  Acute pulmonary embolism CTA chest showed small nonocclusive PE and right ventricular strain.   No DVT bilaterally on venous Doppler US  -- Initially on heparin drip  -- Transitioned to Eliquis 10/30 PM --Continue Eliquis 10 mg twice daily to complete 7 days followed by 5 mg twice daily --Discussed risks/benefits at length with patient and daughter, they want pt to be on anticoagulation.  Discussed importance of diligent fall precautions.  Left lower extremity SVT -will left small saphenous superficial venous thrombus seen on ultrasound.  Noted.  Hypokalemia -K3.3 on 10/30 was replaced -- Monitor BMP, replace K as needed  Elevated troponin-secondary to demand ischemia in the context of sepsis, acute PE, hypoxia.  Elevated troponin 21.  EKG no acute ischemic changes.  No chest pain.  Less likely ACS.     Colitis -- transverse colon and splenic flexure. Pt has some mild mid upper abdominal discomfort, continues to tolerate diet well. Negative C. difficile and GI  panel -Continue IV antibiotics as above --Following blood cultures  -NGTD   AKI on CKD stage III A AKI resolved with IV fluids on admission Creatinine 1.04  >> 0.74.   Prerenal acute kidney injury in the setting of sepsis.   -- Completed sepsis IV fluids -- UA was ordered on admission but never collected.  Patient has no UTI symptoms --Monitor BMP   Thyroid  nodule - TSH and free T4 are normal. CT cervical spine showed 2.2 cm right thyroid  solid nodule. - Outpatient thyroid  ultrasound for characterization.       Fall at home Left-sided hip joint pain from fall Present after mechanical fall.   Daughter reported that nursing home states she did not hit her head. CT abdomen pelvis no evidence of fracture or dislocation.  CT head no acute intracranial abnormality.  CT cervical spine no evidence of fracture or dislocation. -Fall precaution  --Ambulate with assistance. -PT/OT evaluations --SNF recommended, TOC consulted   Euvolemic hyponatremia - resolved Given IV fluids on admission --Monitor BMP    Mild transaminitis Dilation of the hepatic and pancreatic duct CT abdomen pelvis showing bile duct dilation 10 mm small pancreatic dilation.   Pt asymptomatic without abdominal pain or N/V, tolerating diet. Alk phos did rise, peaking at 299 has since normalized T bili has remained normal. Discussed in detail with patient and daughter - will defer MRCP at this time, given there is no no clinical concern for cholelithiasis and acute cholecystitis at this time. --Monitor closely --Monitor CMP daily - Please refer to GI outpatient on discharge.     History of glucoma -- Continue eyedrops   Generalized anxiety disorder - Continue Celexa    Insomnia - Home Ambien  was held on admission given fall and high risk of recurrence, resumed at request of pt and family --Melatonin dc'd        Subjective: Pt was sleeping comfortably but woke up easily to voice when seen this morning.  Daughter was not present at the time.  Patient reports feeling well and hopes to go home soon.  She denies any acute complaints for me today.  No acute events  reported   Physical Exam: Vitals:   10/11/24 1952 10/11/24 2322 10/12/24 0431 10/12/24 1314  BP: (!) 126/55 (!) 140/65 (!) 142/74 (!) 149/64  Pulse: 79 73 81 76  Resp: 14 14 14 18   Temp: 99.2 F (37.3 C) (!) 97.5 F (36.4 C)  98.5 F (36.9 C)  TempSrc: Oral Oral  Oral  SpO2: 96% 93% 95% 93%  Weight:      Height:       General exam: Sleeping comfortably, woke easily to voice and remains engaged in conversation, no acute distress, in good spirits HEENT: moist mucus membranes, mildly hard of hearing Respiratory system: Lungs clear bilaterally, no expiratory wheezes or rhonchi, normal respiratory effort and no longer tachypneic Cardiovascular system: normal S1/S2, RRR, no pedal edema.   Gastrointestinal system: soft, nontender nondistended Central nervous system: A&O x2+. no gross focal neurologic deficits, normal speech Extremities: moves all, no edema, normal tone Skin: dry, intact, normal temperature Psychiatry: normal mood, congruent affect, judgement and insight appear normal   Data Reviewed:  Notable labs --   K3.4 Glucose 116 Calcium 8.8  Alk phos normalized Albumin 2.7 AST 75 >> 89 >> 92 >> 53 ALT 65 >> 73 >> 59 Normal T. Bili 0.5  WBC 19.5 >> 15.6 >> 14.0 >> 13.4 Hbg 9.7 >> 11.3 >> 10.3 >> 10.3  Normal TSH and free T4 Negative C diff  ECHO -- EF 55-60%, no LV rWMA's, grade 1 DD, RV systolic function and size are normal, mildly elevated PASP  Lower extremity venous Doppler ultrasound --negative for DVTs bilaterally, showed a left lower extremity superficial venous thrombus in the small saphenous vein   Family Communication: daughter at bedside on rounds 10/29, 10/30, 10/31.  None present today, will attempt to call daughter as time allows  Disposition: Status is: Inpatient Remains inpatient appropriate because:  Waiting SNF placement   Planned Discharge Destination: return to ALF versus SNF     Time spent: 35 minutes  Author: Burnard DELENA Cunning, DO 10/12/2024 3:01 PM  For on call review [email or web address]

## 2024-10-12 NOTE — Plan of Care (Signed)
   Problem: Education: Goal: Knowledge of General Education information will improve Description: Including pain rating scale, medication(s)/side effects and non-pharmacologic comfort measures Outcome: Progressing   Problem: Nutrition: Goal: Adequate nutrition will be maintained Outcome: Progressing   Problem: Coping: Goal: Level of anxiety will decrease Outcome: Progressing

## 2024-10-13 DIAGNOSIS — J189 Pneumonia, unspecified organism: Secondary | ICD-10-CM | POA: Diagnosis not present

## 2024-10-13 DIAGNOSIS — A419 Sepsis, unspecified organism: Secondary | ICD-10-CM | POA: Diagnosis not present

## 2024-10-13 LAB — CBC
HCT: 38.1 % (ref 36.0–46.0)
Hemoglobin: 11.1 g/dL — ABNORMAL LOW (ref 12.0–15.0)
MCH: 32.6 pg (ref 26.0–34.0)
MCHC: 29.1 g/dL — ABNORMAL LOW (ref 30.0–36.0)
MCV: 112.1 fL — ABNORMAL HIGH (ref 80.0–100.0)
Platelets: 408 K/uL — ABNORMAL HIGH (ref 150–400)
RBC: 3.4 MIL/uL — ABNORMAL LOW (ref 3.87–5.11)
RDW: 12.3 % (ref 11.5–15.5)
WBC: 13.8 K/uL — ABNORMAL HIGH (ref 4.0–10.5)
nRBC: 0.1 % (ref 0.0–0.2)

## 2024-10-13 LAB — COMPREHENSIVE METABOLIC PANEL WITH GFR
ALT: 58 U/L — ABNORMAL HIGH (ref 0–44)
AST: 53 U/L — ABNORMAL HIGH (ref 15–41)
Albumin: 3 g/dL — ABNORMAL LOW (ref 3.5–5.0)
Alkaline Phosphatase: 100 U/L (ref 38–126)
Anion gap: 11 (ref 5–15)
BUN: 11 mg/dL (ref 8–23)
CO2: 23 mmol/L (ref 22–32)
Calcium: 9 mg/dL (ref 8.9–10.3)
Chloride: 104 mmol/L (ref 98–111)
Creatinine, Ser: 0.66 mg/dL (ref 0.44–1.00)
GFR, Estimated: >60 mL/min (ref >60)
Glucose, Bld: 103 mg/dL — ABNORMAL HIGH (ref 70–99)
Potassium: 3.7 mmol/L (ref 3.5–5.1)
Sodium: 138 mmol/L (ref 135–145)
Total Bilirubin: 0.5 mg/dL (ref 0.0–1.2)
Total Protein: 6.3 g/dL — ABNORMAL LOW (ref 6.5–8.1)

## 2024-10-13 LAB — CULTURE, BLOOD (ROUTINE X 2): Culture: NO GROWTH

## 2024-10-13 NOTE — Plan of Care (Signed)
  Problem: Activity: Goal: Risk for activity intolerance will decrease Outcome: Progressing   Problem: Elimination: Goal: Will not experience complications related to bowel motility Outcome: Progressing Goal: Will not experience complications related to urinary retention Outcome: Progressing   Problem: Pain Managment: Goal: General experience of comfort will improve and/or be controlled Outcome: Progressing   Problem: Safety: Goal: Ability to remain free from injury will improve Outcome: Progressing   Problem: Skin Integrity: Goal: Risk for impaired skin integrity will decrease Outcome: Progressing

## 2024-10-13 NOTE — Progress Notes (Addendum)
 Progress Note   Patient: Laura Hancock FMW:[id number] DOB: Feb 18, 1924 DOA: 10/08/2024     5 DOS: the patient was seen and examined on 10/13/2024   Brief hospital course: HPI from admission: Laura Hancock is a 89 y.o. female with medical history significant of CKD stage IIIa, hyperlipidemia, generalized anxiety disorder, insomnia and carotid stenosis presented to emergency department complaining of cough with frequent urination and hip joint pain. Per patient she probably of some kind of mechanical fall on Monday.  She has been also taking Levaquin for possible pneumonia.  However today she has increased confusion and seen by primary care doctor for evaluation.  Patient lives a friend's home.  She is complaining about left-sided hip joint pain with ambulation with head and discomfort.  Also complaining of abdominal pain.  Denies any fever, chill, nausea, vomiting constipation diarrhea.  Reporting having productive cough. Daughter reported patient has a fall at home however she did not hit her head during the fall.   Patient is complaining about ongoing cough for more than 2 weeks recently completed treatment with Levaquin without any improvement. ... See H&P for full HPI on admission & ED course.  Patient was found to have multifocal pneumonia, a small acute PE in the RLL, possible colitis of the transverse colon and splenic flexure and prominent common bile duct up to 10 mm.  After extensive discussion about risks vs benefits of anticoagulation, family agreed and pt was started on IV heparin.    Pt being treated with IV antibiotics for sepsis due to pneumonia and possible colitis, IV heparin for acute PE.  Further hospital course and management as outlined below.     Assessment and Plan:  Severe Sepsis secondary to pneumonia Community-acquired pneumonia Acute hypoxic respiratory failure Acute metabolic encephalopathy-in terms of confusion in the setting of pneumonia Severe sepsis  evidenced by tachypnea and leukocytosis in setting of pneumonia and colitis.  Elevated lactic acid and acute hypoxic respiratory failure consistent with organ dysfunction. CTA chest showed small nonocclusive PE with right ventricular strain, multifocal pneumonia.  - In the ED patient received 2 L of NS and LR bolus and currently on maintenance fluid LR 150 cc/h.  Also received ceftriaxone  and azithromycin . Viral swabs were negative. 10/31 --patient seems less tachypneic, remains stable on room air -- Transition IV doxycycline  Zosyn >> p.o. Doxy and p.o. Augmentin to complete 7-day course -- Following blood culture, sputum culture --Monitor fever curve, CBC, hemodynamics --Symptomatic care per orders PRN --SLP evaluation for swallow --Incentive spirometry and flutter valve  Acute pulmonary embolism CTA chest showed small nonocclusive PE and right ventricular strain.   No DVT bilaterally on venous Doppler US  -- Initially on heparin drip  -- Transitioned to Eliquis 10/30 PM --Continue Eliquis 10 mg twice daily to complete 7 days followed by 5 mg twice daily --Discussed risks/benefits at length with patient and daughter, they want pt to be on anticoagulation.  Discussed importance of diligent fall precautions.  Left lower extremity SVT -will left small saphenous superficial venous thrombus seen on ultrasound.  Noted.  Hypokalemia -resolved with replacement -- Monitor BMP, replace K as needed  Elevated troponin-secondary to demand ischemia in the context of sepsis, acute PE, hypoxia.  Elevated troponin 21.  EKG no acute ischemic changes.  No chest pain.  Less likely ACS.     Colitis -- transverse colon and splenic flexure. Pt has some mild mid upper abdominal discomfort, continues to tolerate diet well. Negative C. difficile and GI panel -Continue  antibiotics as above --Following blood cultures  -NGTD   AKI ruled out  -- mild Cr elevation 1.o4 on admission from baseline 0.8 CKD stage III  A Prerenal acute kidney injury in the setting of sepsis.   -- Completed sepsis IV fluids -- UA was ordered on admission but never collected.  Patient has no UTI symptoms --Monitor BMP   Thyroid  nodule - TSH and free T4 are normal. CT cervical spine showed 2.2 cm right thyroid  solid nodule. - Outpatient thyroid  ultrasound for characterization.       Fall at home Left-sided hip joint pain from fall Present after mechanical fall.   Daughter reported that nursing home states she did not hit her head. CT abdomen pelvis no evidence of fracture or dislocation.  CT head no acute intracranial abnormality.  CT cervical spine no evidence of fracture or dislocation. -Fall precaution  --Ambulate with assistance. -PT/OT evaluations --SNF recommended, TOC consulted   Euvolemic hyponatremia - resolved Given IV fluids on admission --Monitor BMP    Mild transaminitis Dilation of the hepatic and pancreatic duct CT abdomen pelvis showing bile duct dilation 10 mm small pancreatic dilation.   Pt asymptomatic without abdominal pain or N/V, tolerating diet. Alk phos did rise, peaking at 299 has since normalized T bili has remained normal. Discussed in detail with patient and daughter - will defer MRCP at this time, given there is no no clinical concern for cholelithiasis and acute cholecystitis at this time. --Monitor closely --Monitor CMP daily - Please refer to GI outpatient on discharge.     History of glucoma -- Continue eyedrops   Generalized anxiety disorder - Continue Celexa    Insomnia - Home Ambien  was held on admission given fall and high risk of recurrence, resumed at request of pt and family --Melatonin dc'd        Subjective: Pt up in recliner, daughter at bedside this AM.  Pt reports feeling well, hopes to go home soon, happy to hear that should be tomorrow AM.  She reports cough improving some.  No other complaints.    Physical Exam: Vitals:   10/12/24 0431 10/12/24 1314  10/12/24 2022 10/13/24 0435  BP: (!) 142/74 (!) 149/64 (!) 146/62 (!) 140/67  Pulse: 81 76 77 85  Resp: 14 18 16 16   Temp:  98.5 F (36.9 C) 98.5 F (36.9 C) 97.9 F (36.6 C)  TempSrc:  Oral Oral Oral  SpO2: 95% 93% 99% 94%  Weight:      Height:       General exam: awake, alert, up in recliner, no acute distress HEENT: moist mucus membranes, mildly hard of hearing Respiratory system: Lungs clear bilaterally, no expiratory wheezes or rhonchi, normal respiratory effort on room air, normal RR Cardiovascular system: normal S1/S2, RRR, no pedal edema.   Gastrointestinal system: soft, nontender nondistended Central nervous system: A&O x2+. no gross focal neurologic deficits, normal speech Extremities: moves all, no edema, normal tone Skin: dry, intact, normal temperature Psychiatry: normal mood, congruent affect, judgement and insight appear normal   Data Reviewed:  Notable labs --   Albumin 3.0 AST 75 >> 89 >> 92 >> 53 >> 53 ALT 65 >> 73 >> 59 >> 58 Normal T. Bili 0.5  WBC 19.5 >> 15.6 >> 14.0 >> 13.4 >> 13.8 Hbg stable 11.1 Platelets 408    Normal TSH and free T4 Negative C diff  ECHO -- EF 55-60%, no LV rWMA's, grade 1 DD, RV systolic function and size are normal, mildly elevated  PASP  Lower extremity venous Doppler ultrasound --negative for DVTs bilaterally, showed a left lower extremity superficial venous thrombus in the small saphenous vein   Family Communication: daughter at bedside on rounds this AM  Disposition: Status is: Inpatient Remains inpatient appropriate because:  Awaiting SNF, facility unable to admit today       Time spent: 35 minutes  Author: Burnard DELENA Cunning, DO 10/13/2024 2:03 PM  For on call review [email or web address]

## 2024-10-13 NOTE — TOC Progression Note (Addendum)
 Transition of Care [facility]) - Progression Note    Patient Details  Name: Laura Hancock MRN: [id number] Date of Birth: 03-07-1924  Transition of Care [facility]) CM/SW Contact  Sonda Manuella Quill, RN Phone Number: 10/13/2024, 9:14 AM  Clinical Narrative:    Notified by Dr Fausto pt ready for d/c; LVM for Lindsey Ada- [facility] Resident Health Coordinator, and [place], Admissions at [facility] see if ins auth received; awaiting return call.  -1530- ins auth pending in [employer].   Expected Discharge Plan: Home w Home Health Services Barriers to Discharge: Continued Medical Work up               Expected Discharge Plan and Services In-house Referral: NA Discharge Planning Services: CM Consult Post Acute Care Choice: Durable Medical Equipment, Home Health Living arrangements for the past 2 months: Independent Living Facility                 DME Arranged: N/A DME Agency: NA       HH Arranged: NA HH Agency: NA         Social Drivers of Health (SDOH) Interventions SDOH Screenings   Food Insecurity: No Food Insecurity (10/09/2024)  Housing: Low Risk  (10/09/2024)  Transportation Needs: No Transportation Needs (10/09/2024)  Utilities: Not At Risk (10/09/2024)  Social Connections: Moderately Isolated (10/12/2024)  Tobacco Use: Low Risk  (10/08/2024)    Readmission Risk Interventions    10/10/2024    1:35 PM  Readmission Risk Prevention Plan  Post Dischage Appt Complete  Medication Screening Complete  Transportation Screening Complete

## 2024-10-13 NOTE — Plan of Care (Signed)
   Problem: Education: Goal: Knowledge of General Education information will improve Description: Including pain rating scale, medication(s)/side effects and non-pharmacologic comfort measures Outcome: Progressing   Problem: Coping: Goal: Level of anxiety will decrease Outcome: Progressing   Problem: Safety: Goal: Ability to remain free from injury will improve Outcome: Progressing

## 2024-10-14 ENCOUNTER — Other Ambulatory Visit: Payer: Self-pay

## 2024-10-14 DIAGNOSIS — A419 Sepsis, unspecified organism: Secondary | ICD-10-CM | POA: Diagnosis not present

## 2024-10-14 DIAGNOSIS — J189 Pneumonia, unspecified organism: Secondary | ICD-10-CM | POA: Diagnosis not present

## 2024-10-14 LAB — CULTURE, BLOOD (ROUTINE X 2)
Culture: NO GROWTH
Special Requests: ADEQUATE

## 2024-10-14 MED ORDER — PHENOL 1.4 % MT LIQD
1.0000 | OROMUCOSAL | Status: DC | PRN
  Administered 2024-10-14: 1 via OROMUCOSAL
  Filled 2024-10-14: qty 177

## 2024-10-14 NOTE — Progress Notes (Signed)
 Speech Language Pathology Treatment: Dysphagia  Patient Details Name: Laura Hancock MRN: [id number] DOB: 1924-03-06 Today's Date: 10/14/2024 Time: 8894-8878 SLP Time Calculation (min) (ACUTE ONLY): 16 min  Assessment / Plan / Recommendation Clinical Impression  Patient seen for follow up for dysphagia management.  Pt denies coughing with intake - admits to occasional indigestion since here in the hospital.   SLP administered 3 ounce Yale swallow screen, which pt easily passed but was noted to be dyspenic after intake.        Daughter arrived during session and was educated to swallow screen passage and for pt to be mindful if dyspneic with po, assuring she is taking rest breaks PRN.   Pt reports poor appetite.    Thankful pt passed a swallow screen - given her brain scan results showed There are a few small chronic bilateral gangliocapsular lacunar infarcts, and senescent mineralization in both basal ganglia. Which may impair oropharyngeal swallow.    No SLP follow up indicated.    HPI HPI: Laura Hancock is a 89 y.o. female with medical history significant of CKD stage IIIa, hyperlipidemia, generalized anxiety disorder, insomnia and carotid stenosis presented to emergency department complaining of cough with frequent urination and hip joint pain. Admitting diagnoses of sepsis due to pneumonia.  Pt was seen for swallow evaluation and did not present with any s/s of dysphagia - Follow up indicated however to assure she is managing po well.      SLP Plan  All goals met          Recommendations  Diet recommendations: Regular;Thin liquid Liquids provided via: Straw Medication Administration: Whole meds with liquid Supervision: Patient able to self feed Compensations: Minimize environmental distractions;Slow rate;Small sips/bites;Other (Comment) (rest if short of breath) Postural Changes and/or Swallow Maneuvers: Seated upright 90 degrees;Upright 30-60 min after meal                   Oral care BID   None Dysphagia, unspecified (R13.10)     All goals met    Laura POUR, MS [facility] SLP Acute Rehab Services Office [phone]  [place], [place]  10/14/2024, 11:35 AM

## 2024-10-14 NOTE — TOC Progression Note (Addendum)
 Transition of Care [facility]) - Progression Note    Patient Details  Name: Laura Hancock MRN: [id number] Date of Birth: [birth year]  Transition [facility] Care [facility]) CM/SW Contact  Toy LITTIE Agar, RN Phone Number:[phone]  10/14/2024, 12:47 PM  Clinical Narrative:   CM has verified with patient and daughter at bedside Jori that patient wishes to return to [facility] [place]. Insurance shara has not been initiated. CM to initiate insurance auth.   1400 Insurance shara has been initiated through [employer] # [id number] Member ID [id number] customer id [phone]   Expected Discharge Plan: Home w Home Health Services Barriers to Discharge: Continued Medical Work up               Expected Discharge Plan and Services In-house Referral: NA Discharge Planning Services: CM Consult Post Acute Care Choice: Durable Medical Equipment, Home Health Living arrangements for the past 2 months: Independent Living Facility                 DME Arranged: N/A DME Agency: NA       HH Arranged: NA HH Agency: NA         Social Drivers of Health (SDOH) Interventions SDOH Screenings   Food Insecurity: No Food Insecurity (10/09/2024)  Housing: Low Risk  (10/09/2024)  Transportation Needs: No Transportation Needs (10/09/2024)  Utilities: Not At Risk (10/09/2024)  Social Connections: Moderately Isolated (10/12/2024)  Tobacco Use: Low Risk  (10/08/2024)    Readmission Risk Interventions    10/10/2024    1:35 PM  Readmission Risk Prevention Plan  Post Dischage Appt Complete  Medication Screening Complete  Transportation Screening Complete

## 2024-10-14 NOTE — Hospital Course (Signed)
 Laura Hancock is a 89 y.o. female with medical history significant of CKD stage IIIa, hyperlipidemia, generalized anxiety disorder, insomnia and carotid stenosis presented to the hospital with cough frequent urination and hip joint pain after sustaining a mechanical fall.  Patient was also taking Levaquin for possible pneumonia and was more confused with increasing left hip pain so she decided to come to the hospital.  In the ED patient was noted to have multifocal pneumonia with small acute pulmonary embolism in the right lower lobe possible colitis in the transverse colon.  Patient was then started on IV heparin antibiotics and was out of the hospital for further evaluation and treatment.     Severe Sepsis secondary to community-acquired pneumonia Acute hypoxic respiratory failure Acute metabolic encephalopathy secondary to pneumonia  CTA of the chest showed small nonocclusive PE with right ventricular strain, multifocal pneumonia.  Currently on Augmentin and doxycycline .  Plans to complete 7-day course. continue supportive care.  On room air.  Acute pulmonary embolism CTA chest showed small nonocclusive PE and right ventricular strain.   No evidence of DVT on duplex ultrasound of the lower extremities but superficial vein thrombosis.  Was initially on heparin drip which has been transitioned to Eliquis at this time.  Hemodynamically stable.  Not on room air   Left lower extremity SVT -left small saphenous superficial venous thrombus seen on ultrasound.     Hypokalemia -resolved.  Latest potassium of 3.7   Elevated troponin-secondary to demand ischemia in the context of sepsis, acute pulmonary embolism, hypoxia.  Elevated troponin 21.  EKG no acute ischemic changes.  No chest pain.     Colitis -- transverse colon and splenic flexure. Negative C. difficile and GI panel.  Received antibiotics.  Blood cultures negative    CKD stage III A AKI ruled out.  Creatinine of 0.6 at this time    Thyroid  nodule - TSH and free T4 are normal. CT cervical spine showed 2.2 cm right thyroid  solid nodule.  Recommend Outpatient thyroid  ultrasound for characterization.       Fall at home Left-sided hip joint pain from fall Had a mechanical fall.  CT scan without any fracture or dislocation.  PT OT recommending skilled nursing facility placement.  Fall precautions.    Euvolemic hyponatremia - resolved Latest sodium of 138    Elevated LFTs Dilation of the hepatic and pancreatic duct CT abdomen pelvis showing bile duct dilation 10 mm small pancreatic dilation..  Patient asymptomatic.  Previous provider had spoken with the patient's family and deferring MRCP or further testing at this time.  Recommend follow-up with GI outpatient on discharge.     History of glucoma Continue ocular drops   Generalized anxiety disorder - Continue Celexa    Insomnia Has been resumed on Ambien  from home.  Melatonin DC'd

## 2024-10-14 NOTE — Plan of Care (Signed)

## 2024-10-14 NOTE — Progress Notes (Signed)
 PROGRESS NOTE  Laura Hancock FMW:[id number] DOB: 05/27/1924 DOA: 10/08/2024 PCP: Nichole Senior, MD   LOS: 6 days   Brief narrative:  Laura Hancock is a 89 y.o. female with medical history significant of CKD stage IIIa, hyperlipidemia, generalized anxiety disorder, insomnia and carotid stenosis presented to the hospital with cough, frequent urination and hip joint pain after sustaining a mechanical fall.  Patient was also taking Levaquin for possible pneumonia and was more confused with increasing left hip pain so she decided to come to the hospital.  In the ED, patient was noted to have multifocal pneumonia with small acute pulmonary embolism in the right lower lobe possible colitis in the transverse colon.  Patient was then started on IV heparin antibiotics and was out of the hospital for further evaluation and treatment.      Assessment/Plan: Principal Problem:   Sepsis due to pneumonia [facility]) Active Problems:   CAP (community acquired pneumonia)   Acute pulmonary embolism (HCC)   Colitis   Insomnia   CKD (chronic kidney disease) stage 3, GFR 30-59 ml/min (HCC)   Fall at home, initial encounter   Acute kidney injury superimposed on chronic kidney disease   Transaminitis   Hyponatremia   Dilation of pancreatic duct   Joint pain of left hip on movement   Hyperlipidemia   Generalized anxiety disorder   Carotid stenosis   Severe Sepsis secondary to community-acquired pneumonia Acute hypoxic respiratory failure Acute metabolic encephalopathy secondary to pneumonia  CTA of the chest showed small nonocclusive PE with right ventricular strain, multifocal pneumonia.  Currently on Augmentin and doxycycline  to complete the course.  Plan is to complete 7-day course. continue supportive care.  On room air.  Acute pulmonary embolism CTA chest showed small nonocclusive PE and right ventricular strain.   No evidence of DVT on duplex ultrasound of the lower extremities but superficial vein  thrombosis.  Was initially on heparin drip which has been transitioned to Eliquis at this time.  Hemodynamically stable.   on room air   Left lower extremity SVT -left small saphenous superficial venous thrombus seen on ultrasound.     Hypokalemia -resolved.  Latest potassium of 3.7   Elevated troponin-secondary to demand ischemia in the context of sepsis, acute pulmonary embolism, hypoxia.  Elevated troponin 21.  EKG no acute ischemic changes.  No chest pain.     Colitis -- transverse colon and splenic flexure. Negative C. difficile and GI panel.  Received antibiotics.  Blood cultures negative.  Clinically improved   CKD stage III A AKI ruled out.  Creatinine of 0.6 at this time   Thyroid  nodule - TSH and free T4 are normal. CT cervical spine showed 2.2 cm right thyroid  solid nodule.  Recommend Outpatient thyroid  ultrasound for characterization.      Fall at home/Left-sided hip joint pain from fall Had a mechanical fall.  CT scan without any fracture or dislocation.  PT OT recommending skilled nursing facility placement.  Fall precautions.   Hyponatremia. Resolved latest sodium of 138    Elevated LFTs Dilation of the hepatic and pancreatic duct CT abdomen pelvis showing bile duct dilation 10 mm small pancreatic dilation..  Patient asymptomatic.  Previous provider had spoken with the patient's family and deferring MRCP or further testing at this time.  Recommend follow-up with GI outpatient on discharge.    History of glucoma Continue ocular drops   Generalized anxiety disorder - Continue Celexa    Insomnia Has been resumed on Ambien  from home.  Melatonin DC'd   DVT prophylaxis: Place and maintain sequential compression device Start: 10/08/24 2105 SCDs Start: 10/08/24 1951 Place TED hose Start: 10/08/24 1951 apixaban (ELIQUIS) tablet 10 mg  apixaban (ELIQUIS) tablet 5 mg   Disposition: Skilled nursing facility.  Medically stable for disposition  Status is: Inpatient Remains  inpatient appropriate because: Need for rehabilitation    Code Status:     Code Status: Limited: Do not attempt resuscitation (DNR) -DNR-LIMITED -Do Not Intubate/DNI   Family Communication: None at bedside  Consultants: None  Procedures: None  Anti-infectives:  Augmentin and doxycycline   Anti-infectives (From admission, onward)    Start     Dose/Rate Route Frequency Ordered Stop   10/12/24 1200  amoxicillin-clavulanate (AUGMENTIN) 875-125 MG per tablet 1 tablet        1 tablet Oral Every 12 hours 10/12/24 0933 10/15/24 0959   10/09/24 1000  doxycycline  (VIBRA -TABS) tablet 100 mg        100 mg Oral Every 12 hours 10/09/24 0908 10/14/24 0926   10/09/24 0900  doxycycline  (VIBRAMYCIN ) 100 mg in sodium chloride  0.9 % 250 mL IVPB  Status:  Discontinued        100 mg 125 mL/hr over 120 Minutes Intravenous Every 12 hours 10/08/24 1944 10/08/24 1949   10/08/24 2200  piperacillin-tazobactam (ZOSYN) IVPB 3.375 g  Status:  Discontinued        3.375 g 12.5 mL/hr over 240 Minutes Intravenous Every 8 hours 10/08/24 1949 10/12/24 0933   10/08/24 1948  doxycycline  (VIBRAMYCIN ) 100 mg in sodium chloride  0.9 % 250 mL IVPB  Status:  Discontinued        100 mg 125 mL/hr over 120 Minutes Intravenous Every 12 hours 10/08/24 1949 10/09/24 0908   10/08/24 1915  azithromycin  (ZITHROMAX ) 500 mg in sodium chloride  0.9 % 250 mL IVPB        500 mg 250 mL/hr over 60 Minutes Intravenous  Once 10/08/24 1914 10/08/24 2205   10/08/24 1915  cefTRIAXone  (ROCEPHIN ) 1 g in sodium chloride  0.9 % 100 mL IVPB        1 g 200 mL/hr over 30 Minutes Intravenous  Once 10/08/24 1914 10/08/24 2200        Subjective: Today, patient was seen and examined at bedside.  Patient denies any nausea, vomiting, shortness of breath, dyspnea or chest pain.  Feels okay.  Objective: Vitals:   10/14/24 0505 10/14/24 0801  BP: (!) 128/47   Pulse: 73   Resp: 20   Temp: (!) 97.4 F (36.3 C)   SpO2: 96% 98%    Intake/Output  Summary (Last 24 hours) at 10/14/2024 1059 Last data filed at 10/14/2024 0900 Gross per 24 hour  Intake 123 ml  Output --  Net 123 ml   Filed Weights   10/08/24 1932  Weight: 48.5 kg   Body mass index is 18.95 kg/m.   Physical Exam: GENERAL: Patient is alert awake and oriented. Not in obvious distress.  Thinly built, mildly hard of hearing HENT: No scleral pallor or icterus. Pupils equally reactive to light. Oral mucosa is moist NECK: is supple, no gross swelling noted. CHEST: Clear to auscultation. No crackles or wheezes.  Diminished breath sounds bilaterally. CVS: S1 and S2 heard, no murmur. Regular rate and rhythm.  ABDOMEN: Soft, non-tender, bowel sounds are present. EXTREMITIES: No edema. CNS: Cranial nerves are intact.  Moves all extremities with SKIN: warm and dry without rashes.  Data Review: I have personally reviewed the following laboratory data and studies,  CBC: Recent Labs  Lab 10/08/24 1635 10/09/24 0329 10/10/24 0742 10/11/24 0344 10/12/24 0654 10/13/24 0631  WBC 21.7* 19.5* 15.6* 14.0* 13.4* 13.8*  NEUTROABS 19.0*  --   --   --   --   --   HGB 11.3* 9.7* 11.3* 10.3* 10.3* 11.1*  HCT 35.3* 29.3* 35.8* 32.5* 31.3* 38.1  MCV 101.4* 102.1* 105.6* 100.9* 102.0* 112.1*  PLT 291 239 306 329 444* 408*   Basic Metabolic Panel: Recent Labs  Lab 10/09/24 0329 10/10/24 0742 10/11/24 0344 10/12/24 0654 10/13/24 0631  NA 137 139 141 140 138  K 3.6 3.3* 3.6 3.4* 3.7  CL 104 105 106 105 104  CO2 25 20* 24 23 23   GLUCOSE 115* 117* 133* 116* 103*  BUN 18 10 9 10 11   CREATININE 0.74 0.78 0.72 0.71 0.66  CALCIUM 8.2* 9.0 8.9 8.8* 9.0  MG  --  2.0 1.7 1.7  --    Liver Function Tests: Recent Labs  Lab 10/09/24 0329 10/10/24 0742 10/11/24 0344 10/12/24 0654 10/13/24 0631  AST 75* 89* 92* 53* 53*  ALT 38 65* 73* 59* 58*  ALKPHOS 299* 140* 123 101 100  BILITOT 0.5 0.6 0.5 0.5 0.5  PROT 5.4* 6.3* 5.9* 5.8* 6.3*  ALBUMIN 2.8* 3.0* 2.9* 2.7* 3.0*    Recent Labs  Lab 10/08/24 1635  LIPASE 16   No results for input(s): AMMONIA in the last 168 hours. Cardiac Enzymes: No results for input(s): CKTOTAL, CKMB, CKMBINDEX, TROPONINI in the last 168 hours. BNP (last 3 results) No results for input(s): BNP in the last 8760 hours.  ProBNP (last 3 results) Recent Labs    10/08/24 1949  PROBNP 1,984.0*    CBG: No results for input(s): GLUCAP in the last 168 hours. Recent Results (from the past 240 hours)  Blood culture (routine x 2)     Status: None   Collection Time: 10/08/24  6:00 PM   Specimen: BLOOD  Result Value Ref Range Status   Specimen Description   Final    BLOOD LEFT ANTECUBITAL Performed at [facility], [address] [place], [place] [address]    Special Requests   Final    BOTTLES DRAWN AEROBIC AND ANAEROBIC Blood Culture results may not be optimal due to an inadequate volume of blood received in culture bottles Performed at [facility], [address], [place], [place] [address]    Culture   Final    NO GROWTH 5 DAYS Performed at [facility], [address], [place], [place] [address]    Report Status 10/13/2024 FINAL  Final  Blood culture (routine x 2)     Status: None   Collection Time: 10/08/24  6:36 PM   Specimen: BLOOD  Result Value Ref Range Status   Specimen Description   Final    BLOOD RIGHT ANTECUBITAL Performed at [facility], [address], [place], [place] [address]    Special Requests   Final    BOTTLES DRAWN AEROBIC AND ANAEROBIC Blood Culture adequate volume Performed at [facility], [address] [place], [place] [address]    Culture   Final    NO GROWTH 5 DAYS Performed at [facility], [address], [place], [place] [address]    Report Status 10/14/2024 FINAL  Final  Resp panel by RT-PCR (RSV, Flu A&B, Covid) Peripheral     Status: None   Collection Time: 10/08/24  6:43 PM  Specimen: Peripheral; Nasal Swab  Result Value Ref Range Status   SARS Coronavirus 2 by RT PCR NEGATIVE NEGATIVE Final    Comment: (NOTE) SARS-CoV-2 target nucleic acids are NOT DETECTED.  The SARS-CoV-2 RNA is generally detectable in upper respiratory specimens during the acute phase of infection. The lowest concentration of SARS-CoV-2 viral copies this assay can detect is 138 copies/mL. A negative result does not preclude SARS-Cov-2 infection and should not be used as the sole basis for treatment or other patient management decisions. A negative result may occur with  improper specimen collection/handling, submission of specimen other than nasopharyngeal swab, presence of viral mutation(s) within the areas targeted by this assay, and inadequate number of viral copies(<138 copies/mL). A negative result must be combined with clinical observations, patient history, and epidemiological information. The expected result is Negative.  Fact Sheet for Patients:  bloggercourse.com  Fact Sheet for Healthcare Providers:  seriousbroker.it  This test is no t yet approved or cleared by the [place]  FDA and  has been authorized for detection and/or diagnosis of SARS-CoV-2 by FDA under an Emergency Use Authorization (EUA). This EUA will remain  in effect (meaning this test can be used) for the duration of the COVID-19 declaration under Section 564(b)(1) of the Act, 21 U.S.C.section 360bbb-3(b)(1), unless the authorization is terminated  or revoked sooner.       Influenza A by PCR NEGATIVE NEGATIVE Final   Influenza B by PCR NEGATIVE NEGATIVE Final    Comment: (NOTE) The Xpert Xpress SARS-CoV-2/FLU/RSV plus assay is intended as an aid in the diagnosis of influenza from Nasopharyngeal swab specimens and should not be used as a sole basis for treatment. Nasal washings and aspirates are unacceptable for Xpert Xpress  SARS-CoV-2/FLU/RSV testing.  Fact Sheet for Patients: bloggercourse.com  Fact Sheet for Healthcare Providers: seriousbroker.it  This test is not yet approved or cleared by the [place]  FDA and has been authorized for detection and/or diagnosis of SARS-CoV-2 by FDA under an Emergency Use Authorization (EUA). This EUA will remain in effect (meaning this test can be used) for the duration of the COVID-19 declaration under Section 564(b)(1) of the Act, 21 U.S.C. section 360bbb-3(b)(1), unless the authorization is terminated or revoked.     Resp Syncytial Virus by PCR NEGATIVE NEGATIVE Final    Comment: (NOTE) Fact Sheet for Patients: bloggercourse.com  Fact Sheet for Healthcare Providers: seriousbroker.it  This test is not yet approved or cleared by the [place]  FDA and has been authorized for detection and/or diagnosis of SARS-CoV-2 by FDA under an Emergency Use Authorization (EUA). This EUA will remain in effect (meaning this test can be used) for the duration of the COVID-19 declaration under Section 564(b)(1) of the Act, 21 U.S.C. section 360bbb-3(b)(1), unless the authorization is terminated or revoked.  Performed at [facility], [address], [place], [place] [address]   MRSA Next Gen by PCR, Nasal     Status: None   Collection Time: 10/08/24  7:37 PM   Specimen: Nasal Mucosa; Nasal Swab  Result Value Ref Range Status   MRSA by PCR Next Gen NOT DETECTED NOT DETECTED Final    Comment: (NOTE) The GeneXpert MRSA Assay (FDA approved for NASAL specimens only), is one component of a comprehensive MRSA colonization surveillance program. It is not intended to diagnose MRSA infection nor to guide or monitor treatment for MRSA infections. Test performance is not FDA approved in patients less than 82 years old. Performed at [facility],  [address] [place], [place] [address]   Gastrointestinal Panel by PCR , Stool     Status: None   Collection Time: 10/09/24 10:51 AM   Specimen: Stool  Result Value Ref Range Status   Campylobacter species NOT DETECTED NOT DETECTED Final   Plesimonas shigelloides NOT DETECTED NOT DETECTED Final   Salmonella species NOT DETECTED NOT DETECTED Final   Yersinia enterocolitica NOT DETECTED NOT DETECTED Final   Vibrio species NOT DETECTED NOT DETECTED Final   Vibrio cholerae NOT DETECTED NOT DETECTED Final   Enteroaggregative E coli (EAEC) NOT DETECTED NOT DETECTED Final   Enteropathogenic E coli (EPEC) NOT DETECTED NOT DETECTED Final   Enterotoxigenic E coli (ETEC) NOT DETECTED NOT DETECTED Final   Shiga like toxin producing E coli (STEC) NOT DETECTED NOT DETECTED Final   Shigella/Enteroinvasive E coli (EIEC) NOT DETECTED NOT DETECTED Final   Cryptosporidium NOT DETECTED NOT DETECTED Final   Cyclospora cayetanensis NOT DETECTED NOT DETECTED Final   Entamoeba histolytica NOT DETECTED NOT DETECTED Final   Giardia lamblia NOT DETECTED NOT DETECTED Final   Adenovirus F40/41 NOT DETECTED NOT DETECTED Final   Astrovirus NOT DETECTED NOT DETECTED Final   Norovirus GI/GII NOT DETECTED NOT DETECTED Final   Rotavirus A NOT DETECTED NOT DETECTED Final   Sapovirus (I, II, IV, and V) NOT DETECTED NOT DETECTED Final    Comment: Performed at [facility], [address] [place], [place] [address]  C Difficile Quick Screen w PCR reflex     Status: None   Collection Time: 10/09/24 10:51 AM   Specimen: STOOL  Result Value Ref Range Status   C Diff antigen NEGATIVE NEGATIVE Final   C Diff toxin NEGATIVE NEGATIVE Final   C Diff interpretation No C. difficile detected.  Final    Comment: Performed at [facility], [address], [place], [place] [address]     Studies: No results found.    Airyonna Franklyn, MD  [facility] 10/14/2024  If 7PM-7AM,  please contact night-coverage

## 2024-10-14 NOTE — Progress Notes (Signed)
 RT asked to assess pt per RN due to WOB. Pt sats 98% on room air, HR 69, RR 22. Pt states her breathing is ok. No further orders for RT at this time and no distress noted.

## 2024-10-15 DIAGNOSIS — A419 Sepsis, unspecified organism: Secondary | ICD-10-CM | POA: Diagnosis not present

## 2024-10-15 DIAGNOSIS — J189 Pneumonia, unspecified organism: Secondary | ICD-10-CM | POA: Diagnosis not present

## 2024-10-15 MED ORDER — ACETAMINOPHEN 325 MG PO TABS: 650.0000 mg | ORAL_TABLET | Freq: Four times a day (QID) | ORAL | Status: AC | PRN

## 2024-10-15 MED ORDER — ENSURE PLUS HIGH PROTEIN PO LIQD: 237.0000 mL | Freq: Every day | ORAL | Status: AC

## 2024-10-15 MED ORDER — HYDROCODONE-ACETAMINOPHEN 5-325 MG PO TABS: 1.0000 | ORAL_TABLET | Freq: Three times a day (TID) | ORAL | 0 refills | Status: DC | PRN

## 2024-10-15 MED ORDER — APIXABAN 5 MG PO TABS: ORAL_TABLET | ORAL | Status: DC

## 2024-10-15 MED ORDER — SENNA 8.6 MG PO TABS: 1.0000 | ORAL_TABLET | Freq: Every day | ORAL | Status: AC

## 2024-10-15 MED ORDER — ALBUTEROL SULFATE (2.5 MG/3ML) 0.083% IN NEBU: 2.5000 mg | INHALATION_SOLUTION | Freq: Four times a day (QID) | RESPIRATORY_TRACT | Status: AC | PRN

## 2024-10-15 MED ORDER — ZOLPIDEM TARTRATE 5 MG PO TABS: 5.0000 mg | ORAL_TABLET | Freq: Every day | ORAL | 0 refills | Status: DC

## 2024-10-15 NOTE — Discharge Summary (Signed)
 Physician Discharge Summary  Laura Hancock FMW:[id number] DOB: Jun 30, 1924 DOA: 10/08/2024  PCP: Laura Senior, MD  Admit date: 10/08/2024 Discharge date: 10/15/2024  Admitted From: Independent living facility  Discharge disposition: Skilled nursing facility   Recommendations for Outpatient Follow-Up:   Follow up with your primary care provider at the skilled nursing facility in 3 to 5 days Check CBC, CMP, magnesium in the next visit Follow-up recommended with GI as outpatient for abnormal dilatation of the hepatic and pancreatic duct   Discharge Diagnosis:   Principal Problem:   Sepsis due to pneumonia [facility]) Active Problems:   CAP (community acquired pneumonia)   Acute pulmonary embolism (HCC)   Colitis   Insomnia   CKD (chronic kidney disease) stage 3, GFR 30-59 ml/min (HCC)   Fall at home, initial encounter   Acute kidney injury superimposed on chronic kidney disease   Transaminitis   Hyponatremia   Dilation of pancreatic duct   Joint pain of left hip on movement   Hyperlipidemia   Generalized anxiety disorder   Carotid stenosis   Discharge Condition: Improved.  Diet recommendation: Low sodium, heart healthy.    Wound care: None.  Code status: DNR   History of Present Illness:     Laura Hancock is a 89 y.o. female with medical history significant of CKD stage IIIa, hyperlipidemia, generalized anxiety disorder, insomnia and carotid stenosis presented to the hospital with cough, frequent urination and hip joint pain after sustaining a mechanical fall.  Patient was also taking Levaquin for possible pneumonia and was more confused with increasing left hip pain so she decided to come to the hospital.  In the ED, patient was noted to have multifocal pneumonia with small acute pulmonary embolism in the right lower lobe possible colitis in the transverse colon.  Patient was then started on IV heparin antibiotics and was out of the hospital for further evaluation and  treatment.    Hospital Course:   Following conditions were addressed during hospitalization as listed below,  Severe Sepsis secondary to community-acquired pneumonia Acute hypoxic respiratory failure Acute metabolic encephalopathy secondary to pneumonia   CTA of the chest showed small nonocclusive PE with right ventricular strain, multifocal pneumonia.  Completed 7-day course of Augmentin and doxycycline .   On room air.   Acute pulmonary embolism CTA chest showed small nonocclusive PE and right ventricular strain.   No evidence of DVT on duplex ultrasound of the lower extremities but superficial vein thrombosis.  Was initially on heparin drip which has been transitioned to Eliquis at this time.  Hemodynamically stable.   on room air.  No chest pain or acute issues.   Left lower extremity SVT -left small saphenous superficial venous thrombus seen on ultrasound.     Hypokalemia -resolved.  Latest potassium of 3.7   Elevated troponin-secondary to demand ischemia in the context of sepsis, acute pulmonary embolism, hypoxia.  Elevated troponin 21.  EKG no acute ischemic changes.  No chest pain.     Colitis -- transverse colon and splenic flexure. Negative C. difficile and GI panel.  Received antibiotics.  Blood cultures negative.  Clinically improved   CKD stage III A AKI ruled out.  Creatinine of 0.6 at this time   Thyroid  nodule - TSH and free T4 are normal. CT cervical spine showed 2.2 cm right thyroid  solid nodule.  Recommend Outpatient thyroid  ultrasound for characterization.      Fall at home/Left-sided hip joint pain from fall Had a mechanical fall.  CT scan  without any fracture or dislocation.  PT OT recommending skilled nursing facility placement.  Continue fall precautions.   Hyponatremia. Resolved latest sodium of 138    Elevated LFTs Dilation of the hepatic and pancreatic duct CT abdomen pelvis showing bile duct dilation 10 mm small pancreatic dilation..  Patient  asymptomatic.  Previous provider had spoken with the patient's family and deferring MRCP or further testing at this time.  Recommend follow-up with GI outpatient on discharge.    History of glucoma Continue ocular drops   Generalized anxiety disorder - Continue Celexa    Insomnia Has been resumed on Ambien  from home.  Melatonin DC'd   Disposition.  At this time, patient is stable for disposition to skilled nursing facility  Medical Consultants:   None.  Procedures:    None Subjective:   Today, patient was seen and examined at bedside.  Anxious about getting discharged.  Denies any shortness of breath dyspnea chest pain fever chills or rigor.  Patient's daughter at bedside   Discharge Exam:   Vitals:   10/14/24 2025 10/15/24 0541  BP: (!) 114/54 (!) 118/56  Pulse: 66 72  Resp: 16 16  Temp: 98.2 F (36.8 C) 97.8 F (36.6 C)  SpO2: 93% 96%   Vitals:   10/14/24 0801 10/14/24 1436 10/14/24 2025 10/15/24 0541  BP:  (!) 141/64 (!) 114/54 (!) 118/56  Pulse:  76 66 72  Resp:  14 16 16   Temp:  97.6 F (36.4 C) 98.2 F (36.8 C) 97.8 F (36.6 C)  TempSrc:  Oral Oral Oral  SpO2: 98% 97% 93% 96%  Weight:      Height:        GENERAL: Alert awake and Communicative.  Thinly built, hard of hearing.  Anxious..  On room air HENT: No scleral pallor or icterus. Pupils equally reactive to light. Oral mucosa is moist NECK: is supple, no gross swelling noted. CHEST: Clear to auscultation.  Decreased breath sounds bilaterally CVS: S1 and S2 heard, no murmur. Regular rate and rhythm.  ABDOMEN: Soft, non-tender, bowel sounds are present. EXTREMITIES: No edema. CNS: Cranial nerves are intact.  Moves all extremities  SKIN: warm and dry without rashes.  The results of significant diagnostics from this hospitalization (including imaging, microbiology, ancillary and laboratory) are listed below for reference.     Diagnostic Studies:   VAS US  LOWER EXTREMITY VENOUS (DVT) Result Date:  10/10/2024  Lower Venous DVT Study Patient Name:  Laura Hancock  Date of Exam:   10/09/2024 Medical Rec #: [id number]      Accession #:    [id number] Date of Birth: 05-17-1924     Patient Gender: F Patient Age:   65 years Exam [facility]  [facility] Procedure:      VAS US  LOWER EXTREMITY VENOUS (DVT) Referring Phys: MICAELA SUNDIL --------------------------------------------------------------------------------  Indications: Swelling, Edema, and pulmonary embolism.  Comparison Study: Previous study of the left lower extremity on 2.5.2021. Performing Technologist: Edilia Elden Appl  Examination Guidelines: A complete evaluation includes B-mode imaging, spectral Doppler, color Doppler, and power Doppler as needed of all accessible portions of each vessel. Bilateral testing is considered an integral part of a complete examination. Limited examinations for reoccurring indications may be performed as noted. The reflux portion of the exam is performed with the patient in reverse Trendelenburg.  +---------+---------------+---------+-----------+----------+--------------+ RIGHT    CompressibilityPhasicitySpontaneityPropertiesThrombus Aging +---------+---------------+---------+-----------+----------+--------------+ CFV      Full           Yes  Yes                                 +---------+---------------+---------+-----------+----------+--------------+ SFJ      Full           Yes      Yes                                 +---------+---------------+---------+-----------+----------+--------------+ FV Prox  Full                                                        +---------+---------------+---------+-----------+----------+--------------+ FV Mid   Full                                                        +---------+---------------+---------+-----------+----------+--------------+ FV DistalFull           Yes      Yes                                  +---------+---------------+---------+-----------+----------+--------------+ PFV      Full                                                        +---------+---------------+---------+-----------+----------+--------------+ POP      Full           No       Yes                  Rouleaux flow. +---------+---------------+---------+-----------+----------+--------------+ PTV      Full                                                        +---------+---------------+---------+-----------+----------+--------------+ PERO     Full                                                        +---------+---------------+---------+-----------+----------+--------------+   +---------+---------------+---------+-----------+----------+--------------+ LEFT     CompressibilityPhasicitySpontaneityPropertiesThrombus Aging +---------+---------------+---------+-----------+----------+--------------+ CFV      Full           Yes      Yes                                 +---------+---------------+---------+-----------+----------+--------------+ SFJ      Full           Yes      Yes                                 +---------+---------------+---------+-----------+----------+--------------+  FV Prox  Full                                                        +---------+---------------+---------+-----------+----------+--------------+ FV Mid   Full                                                        +---------+---------------+---------+-----------+----------+--------------+ FV DistalFull                                                        +---------+---------------+---------+-----------+----------+--------------+ PFV      Full                                                        +---------+---------------+---------+-----------+----------+--------------+ POP      Full           Yes      Yes                                  +---------+---------------+---------+-----------+----------+--------------+ PTV      Full                                                        +---------+---------------+---------+-----------+----------+--------------+ PERO     Full                                                        +---------+---------------+---------+-----------+----------+--------------+ SSV      None           No       No                   Chronic        +---------+---------------+---------+-----------+----------+--------------+ Chronic calcified deep vein thrombosis noted in the left small saphenous vein at the proximal segment.    Summary: RIGHT: - There is no evidence of deep vein thrombosis in the lower extremity.  - No cystic structure found in the popliteal fossa.  LEFT: - Findings consistent with chronic superficial vein thrombosis involving the left small saphenous vein.  - No cystic structure found in the popliteal fossa.  *See table(s) above for measurements and observations. Electronically signed by Debby Robertson on 10/10/2024 at 8:01:23 PM.    Final    ECHOCARDIOGRAM COMPLETE Result Date: 10/09/2024    ECHOCARDIOGRAM REPORT   Patient Name:   Jayma D Ahrendt Date of Exam: 10/09/2024 Medical Rec #:  [id number]  Height:       63.0 in Accession #:    [id number]    Weight:       107.0 lb Date of Birth:  11/12/1924    BSA:          1.482 m Patient Age:    89 years      BP:           122/47 mmHg Patient Gender: F             HR:           109 bpm. Exam Location:  Inpatient Procedure: 2D Echo, Color Doppler and Cardiac Doppler (Both Spectral and Color            Flow Doppler were utilized during procedure). Indications:    Pulmonary Embolism I26.9  History:        Patient has no prior history of Echocardiogram examinations.                 Signs/Symptoms:Shortness of Breath. H/O Hyperlipidemia.  Sonographer:    BERNARDA ROCKS Referring Phys: [id number] SUBRINA SUNDIL IMPRESSIONS  1. Left ventricular ejection  fraction, by estimation, is 55 to 60%. Left ventricular ejection fraction by 2D MOD biplane is 59.7 %. The left ventricle has normal function. The left ventricle has no regional wall motion abnormalities. Left ventricular diastolic parameters are consistent with Grade I diastolic dysfunction (impaired relaxation). Elevated left ventricular end-diastolic pressure.  2. Right ventricular systolic function is normal. The right ventricular size is normal. There is mildly elevated pulmonary artery systolic pressure. The estimated right ventricular systolic pressure is 40.5 mmHg.  3. The mitral valve is grossly normal. Trivial mitral valve regurgitation.  4. The aortic valve is tricuspid. Aortic valve regurgitation is not visualized. Aortic valve sclerosis/calcification is present, without any evidence of aortic stenosis. Aortic valve mean gradient measures 8.0 mmHg.  5. The inferior vena cava is normal in size with greater than 50% respiratory variability, suggesting right atrial pressure of 3 mmHg. Comparison(s): No prior Echocardiogram. FINDINGS  Left Ventricle: Left ventricular ejection fraction, by estimation, is 55 to 60%. Left ventricular ejection fraction by 2D MOD biplane is 59.7 %. The left ventricle has normal function. The left ventricle has no regional wall motion abnormalities. The left ventricular internal cavity size was normal in size. There is no left ventricular hypertrophy. Left ventricular diastolic parameters are consistent with Grade I diastolic dysfunction (impaired relaxation). Elevated left ventricular end-diastolic pressure. Right Ventricle: The right ventricular size is normal. No increase in right ventricular wall thickness. Right ventricular systolic function is normal. There is mildly elevated pulmonary artery systolic pressure. The tricuspid regurgitant velocity is 3.06  m/s, and with an assumed right atrial pressure of 3 mmHg, the estimated right ventricular systolic pressure is 40.5 mmHg.  Left Atrium: Left atrial size was normal in size. Right Atrium: Right atrial size was normal in size. Pericardium: There is no evidence of pericardial effusion. Mitral Valve: The mitral valve is grossly normal. Trivial mitral valve regurgitation. MV peak gradient, 8.2 mmHg. The mean mitral valve gradient is 3.0 mmHg. Tricuspid Valve: The tricuspid valve is grossly normal. Tricuspid valve regurgitation is mild. Aortic Valve: The aortic valve is tricuspid. Aortic valve regurgitation is not visualized. Aortic valve sclerosis/calcification is present, without any evidence of aortic stenosis. Aortic valve mean gradient measures 8.0 mmHg. Aortic valve peak gradient measures 17.2 mmHg. Aortic valve area, by VTI measures 1.46 cm. Pulmonic Valve: The pulmonic valve was normal in structure. Pulmonic  valve regurgitation is not visualized. Aorta: The aortic root and ascending aorta are structurally normal, with no evidence of dilitation. Venous: The inferior vena cava is normal in size with greater than 50% respiratory variability, suggesting right atrial pressure of 3 mmHg. IAS/Shunts: No atrial level shunt detected by color flow Doppler.  LEFT VENTRICLE PLAX 2D                        Biplane EF (MOD) LVIDd:         4.10 cm         LV Biplane EF:   Left LVIDs:         2.60 cm                          ventricular LV PW:         0.90 cm                          ejection LV IVS:        0.90 cm                          fraction by LVOT diam:     2.10 cm                          2D MOD LV SV:         57                               biplane is LV SV Index:   39                               59.7 %. LVOT Area:     3.46 cm                                Diastology                                LV e' medial:    6.53 cm/s LV Volumes (MOD)               LV E/e' medial:  18.4 LV vol d, MOD    92.5 ml       LV e' lateral:   7.72 cm/s A2C:                           LV E/e' lateral: 15.5 LV vol d, MOD    73.9 ml A4C: LV vol s, MOD     36.7 ml A2C: LV vol s, MOD    28.4 ml A4C: LV SV MOD A2C:   55.8 ml LV SV MOD A4C:   73.9 ml LV SV MOD BP:    51.2 ml RIGHT VENTRICLE             IVC RV Basal diam:  2.70 cm     IVC diam: 1.70 cm RV S prime:     14.90 cm/s TAPSE (M-mode): 2.2 cm RVSP:  40.5 mmHg LEFT ATRIUM             Index        RIGHT ATRIUM           Index LA diam:        3.30 cm 2.23 cm/m   RA Pressure: 3.00 mmHg LA Vol (A2C):   51.8 ml 34.95 ml/m  RA Area:     11.40 cm LA Vol (A4C):   46.1 ml 31.10 ml/m  RA Volume:   21.60 ml  14.57 ml/m LA Biplane Vol: 49.2 ml 33.19 ml/m  AORTIC VALVE                     PULMONIC VALVE AV Area (Vmax):    1.79 cm      PV Vmax:       0.87 m/s AV Area (Vmean):   1.53 cm      PV Peak grad:  3.0 mmHg AV Area (VTI):     1.46 cm AV Vmax:           207.50 cm/s AV Vmean:          128.500 cm/s AV VTI:            0.395 m AV Peak Grad:      17.2 mmHg AV Mean Grad:      8.0 mmHg LVOT Vmax:         107.00 cm/s LVOT Vmean:        56.800 cm/s LVOT VTI:          0.166 m LVOT/AV VTI ratio: 0.42  AORTA Ao Root diam: 2.85 cm Ao Asc diam:  2.90 cm MITRAL VALVE                TRICUSPID VALVE MV Area (PHT): 5.70 cm     TR Peak grad:   37.5 mmHg MV Area VTI:   2.10 cm     TR Vmax:        306.00 cm/s MV Peak grad:  8.2 mmHg     Estimated RAP:  3.00 mmHg MV Mean grad:  3.0 mmHg     RVSP:           40.5 mmHg MV Vmax:       1.43 m/s MV Vmean:      78.8 cm/s    SHUNTS MV Decel Time: 133 msec     Systemic VTI:  0.17 m MV E velocity: 120.00 cm/s  Systemic Diam: 2.10 cm MV A velocity: 141.00 cm/s MV E/A ratio:  0.85 Vinie Maxcy MD Electronically signed by Vinie Maxcy MD Signature Date/Time: 10/09/2024/1:54:30 PM    Final    CT Head Wo Contrast Result Date: 10/08/2024 EXAM: CT HEAD AND CERVICAL SPINE 10/08/2024 08:19:30 PM TECHNIQUE: CT of the head and cervical spine was performed without the administration of intravenous contrast. Multiplanar reformatted images are provided for review. Automated exposure control,  iterative reconstruction, and/or weight-based adjustment of the mA/kV was utilized to reduce the radiation dose to as low as reasonably achievable. COMPARISON: Comparison is made with cervical spine MRI of 03/24/20. No prior cross-sectional imaging of the brain. CLINICAL HISTORY: Polytrauma, blunt. Patient was sent to ED by Dr. Nichole (PCP) for pneumonia and UTI. Clemens a few nights ago and increased confusion. She lives at [facility] and was seen by staff night of fall. Reports some soreness across hips and shoulders from. Denies LOC; and was able to scoot on rear to  call for help. FINDINGS: CT HEAD BRAIN AND VENTRICLES: Mild to moderate cerebrocerebellar atrophy, atrophic ventriculomegaly, and small vessel disease of the cerebral white matter are seen. There are a few small chronic bilateral gangliocapsular lacunar infarcts, and senescent mineralization in both basal ganglia. No acute intracranial hemorrhage. No mass effect or midline shift. No abnormal extra-axial fluid collection. No evidence of acute infarct. No hydrocephalus. ORBITS: No acute abnormality. SINUSES AND MASTOIDS: Mild mucosal thickening of the paranasal sinuses without fluid levels. No mastoid effusion is seen. SOFT TISSUES AND SKULL: No depressed skull fractures. No focal skull lesions. No visible scalp hematoma. . Benign dural calcifications are scattered along the falx and tentorium. Heavy calcification of the carotid siphons is seen. No hyperdense vessels. Osteopenia is present without evidence of fracture or focal pathologic bone lesion. CT CERVICAL SPINE BONES AND ALIGNMENT: Minimal 1 to 2 mm grade 1 degenerative anterior subluxation of C2-3 and C3-4, and 3 to 4 mm grade 1 anterolisthesis of C7 upon C1, also degenerative, all stable. No new alignment abnormality or traumatic malalignment is seen. Bone on bone anterior atlanto-dental joint space loss and bone on bone C1 C2 lateral mass articulation joint loss, with asymmetric bulky  hypertrophic arthropathy of the left lateral mass articulation are also noted. DEGENERATIVE CHANGES: Degenerative subcortical cystic change and sclerosis of the dens, chronic discogenic endplate sclerosis opposite C6-C7, and ankylosed hypertrophic facet arthropathy bilaterally at C2-C4 and on the right at C5-6. SABRA The cervical discs are diffusely degenerated except for C2-C3, which is normal in height. The other discs are collapsed with varying degrees of partial interbody ankylosis. Bidirectional endplate osteophytes are seen from C2-3 through C7-T1, with mild effacement of the ventral cord surface secondary 2 posterior osteophytes at C4-5, C5-6, and C6-7, and additional hypertrophic dorsal ligamentous ossification present C6-C7. No levels show frank cord compression. Diffuse hypertrophic facet arthropathy, multilevel uncinate osteophytes, and multilevel severe acquired foraminal stenosis are seen, similar to the MRI in 2021. SOFT TISSUES: No prevertebral soft tissue swelling. VASCULATURE: Calcific plaques are seen at both carotid bifurcations. LUNGS: Asymmetric left greater than right pleuroparenchymal scarlike opacity is seen in the lung apices. THYROID : A 2.2 cm hypodense solid nodule is seen in the right lobe of the thyroid  gland. Further evaluation should only be considered taking into account advanced age and life expectancy. IMPRESSION: 1. No acute intracranial CT findings or depressed skull fractures. Chronic changes. 2. No acute fracture or traumatic malalignment of the cervical spine. 3. Multilevel severe cervical foraminal stenosis, and cervical spondylosis without cord compression, similar to prior MRI in 2021. 4. Right thyroid  lobe solid nodule measuring 2.2 cm; ordinarily would recommend dedicated thyroid  ultrasound for characterization. In this case, further evaluation should take into account advanced age and life expectancy. 5. Carotid atherosclerosis. Electronically signed by: Francis Quam MD  10/08/2024 08:51 PM EDT RP Workstation: [id number]   CT Cervical Spine Wo Contrast Result Date: 10/08/2024 EXAM: CT HEAD AND CERVICAL SPINE 10/08/2024 08:19:30 PM TECHNIQUE: CT of the head and cervical spine was performed without the administration of intravenous contrast. Multiplanar reformatted images are provided for review. Automated exposure control, iterative reconstruction, and/or weight-based adjustment of the mA/kV was utilized to reduce the radiation dose to as low as reasonably achievable. COMPARISON: Comparison is made with cervical spine MRI of 03/24/20. No prior cross-sectional imaging of the brain. CLINICAL HISTORY: Polytrauma, blunt. Patient was sent to ED by Dr. Nichole (PCP) for pneumonia and UTI. Clemens a few nights ago and increased confusion. She lives at [facility] [place] and was seen by staff night of fall. Reports some soreness across hips and shoulders from. Denies LOC; and was able to scoot on rear to call for help. FINDINGS: CT HEAD BRAIN AND VENTRICLES: Mild to moderate cerebrocerebellar atrophy, atrophic ventriculomegaly, and small vessel disease of the cerebral white matter are seen. There are a few small chronic bilateral gangliocapsular lacunar infarcts, and senescent mineralization in both basal ganglia. No acute intracranial hemorrhage. No mass effect or midline shift. No abnormal extra-axial fluid collection. No evidence of acute infarct. No hydrocephalus. ORBITS: No acute abnormality. SINUSES AND MASTOIDS: Mild mucosal thickening of the paranasal sinuses without fluid levels. No mastoid effusion is seen. SOFT TISSUES AND SKULL: No depressed skull fractures. No focal skull lesions. No visible scalp hematoma. . Benign dural calcifications are scattered along the falx and tentorium. Heavy calcification of the carotid siphons is seen. No hyperdense vessels. Osteopenia is present without evidence of fracture or focal pathologic bone lesion. CT CERVICAL SPINE BONES AND ALIGNMENT: Minimal  1 to 2 mm grade 1 degenerative anterior subluxation of C2-3 and C3-4, and 3 to 4 mm grade 1 anterolisthesis of C7 upon C1, also degenerative, all stable. No new alignment abnormality or traumatic malalignment is seen. Bone on bone anterior atlanto-dental joint space loss and bone on bone C1 C2 lateral mass articulation joint loss, with asymmetric bulky hypertrophic arthropathy of the left lateral mass articulation are also noted. DEGENERATIVE CHANGES: Degenerative subcortical cystic change and sclerosis of the dens, chronic discogenic endplate sclerosis opposite C6-C7, and ankylosed hypertrophic facet arthropathy bilaterally at C2-C4 and on the right at C5-6. SABRA The cervical discs are diffusely degenerated except for C2-C3, which is normal in height. The other discs are collapsed with varying degrees of partial interbody ankylosis. Bidirectional endplate osteophytes are seen from C2-3 through C7-T1, with mild effacement of the ventral cord surface secondary 2 posterior osteophytes at C4-5, C5-6, and C6-7, and additional hypertrophic dorsal ligamentous ossification present C6-C7. No levels show frank cord compression. Diffuse hypertrophic facet arthropathy, multilevel uncinate osteophytes, and multilevel severe acquired foraminal stenosis are seen, similar to the MRI in 2021. SOFT TISSUES: No prevertebral soft tissue swelling. VASCULATURE: Calcific plaques are seen at both carotid bifurcations. LUNGS: Asymmetric left greater than right pleuroparenchymal scarlike opacity is seen in the lung apices. THYROID : A 2.2 cm hypodense solid nodule is seen in the right lobe of the thyroid  gland. Further evaluation should only be considered taking into account advanced age and life expectancy. IMPRESSION: 1. No acute intracranial CT findings or depressed skull fractures. Chronic changes. 2. No acute fracture or traumatic malalignment of the cervical spine. 3. Multilevel severe cervical foraminal stenosis, and cervical spondylosis  without cord compression, similar to prior MRI in 2021. 4. Right thyroid  lobe solid nodule measuring 2.2 cm; ordinarily would recommend dedicated thyroid  ultrasound for characterization. In this case, further evaluation should take into account advanced age and life expectancy. 5. Carotid atherosclerosis. Electronically signed by: Francis Quam MD 10/08/2024 08:51 PM EDT RP Workstation: [id number]   CT Angio Chest PE W and/or Wo Contrast Addendum Date: 10/08/2024 : Results were discussed with Dr. Ruthe on 09/30/2022 at 7:21 pm. ---------------------------------------------------- Electronically signed by: Greig Pique MD 10/08/2024 07:21 PM EDT RP Workstation: [id number]   EXAM: CTA of the Chest with contrast for PE 10/08/2024 06:19:47 PM TECHNIQUE: CTA of the chest was performed after the administration of intravenous contrast. Multiplanar reformatted images are provided for review. MIP images are provided for review. Automated exposure control, iterative reconstruction,  and/or weight based adjustment of the mA/kV was utilized to reduce the radiation dose to as low as reasonably achievable. COMPARISON: Chest x-ray 10/08/2024. CLINICAL HISTORY: Pulmonary embolism (PE) suspected, high prob. Pt was sent to ED by Dr. Nichole (PCP) for pneumonia and UTI. Clemens a few nights ago and increased confusion. She lives at [facility] and was seen by staff night of fall. Reports some soreness across hips and shoulders from. Denies LOC and was able to scoot on rear to call for help. FINDINGS: PULMONARY ARTERIES: Pulmonary arteries are adequately opacified for evaluation. There is a single small non-occlusive pulmonary embolism within right lower lobe lobar branch. Additional significant findings include vessel cutoff and no pulmonary arteries identified in the anterior left upper lobe, although no definitive thrombus identified. Main pulmonary artery is normal in caliber. MEDIASTINUM: Heart is mildly enlarged. The  pericardium demonstrates no acute abnormality. There are atherosclerotic calcifications of the aorta. There is a hypodense right thyroid  nodule measuring 18 mm. There is an enlarged subcarinal lymph node measuring 13 mm short axis. LYMPH NODES: Enlarged subcarinal lymph node measuring 13 mm short axis. No hilar or axillary lymphadenopathy. LUNGS AND PLEURA: There are trace bilateral pleural effusions. No pneumothorax. There is airspace consolidation within the inferior left lower lobe and lingula. Additional patchy smaller multifocal areas of airspace consolidation seen centrally and peripherally within the left upper lobe. Multifocal ground-glass opacities are seen peripherally in the right upper lobe. Minimal emphysema present. UPPER ABDOMEN: Limited images of the upper abdomen are unremarkable. SOFT TISSUES AND BONES: L1 chronic-appearing compression deformity. No acute soft tissue abnormality. IMPRESSION: 1. Single small non-occlusive pulmonary embolism within the right lower lobe lobar branch. 2. No pulmonary arteries identified in the anterior left upper lobe, although no definitive thrombus identified; correlate clinically and consider follow-up imaging if concern persists. 3. Right heart strain with rv/lv ratio 1.2. 4. Multifocal ground-glass and airspace opacities are seen throughout both lungs, left greater than right. The most significant consolidation is in the left lower lobe. 5. Enlarged subcarinal lymph node measuring 13 mm short axis; indeterminate and may be reactive. 6. Trace bilateral pleural effusions. 7. Incidental right thyroid  nodule measuring 18 mm. Per ACR guidelines for patients 35 years, recommend non-emergent thyroid  ultrasound. Electronically signed by: Greig Pique MD 10/08/2024 06:37 PM EDT RP Workstation: [id number]   CT ABDOMEN PELVIS W CONTRAST Result Date: 10/08/2024 EXAM: CT ABDOMEN AND PELVIS WITH CONTRAST 10/08/2024 06:19:47 PM TECHNIQUE: CT of the abdomen and pelvis was  performed with the administration of 75 mL of iohexol (OMNIPAQUE) 350 MG/ML injection. Multiplanar reformatted images are provided for review. Automated exposure control, iterative reconstruction, and/or weight-based adjustment of the mA/kV was utilized to reduce the radiation dose to as low as reasonably achievable. COMPARISON: None available. CLINICAL HISTORY: Abdominal pain, acute, nonlocalized. Pt was sent to ED by Dr. Nichole (PCP) for pneumonia and UTI. Clemens a few nights ago and increased confusion. She lives at [facility] and was seen by staff night of fall. Reports some soreness across hips and shoulders from. Denies LOC and was able to scoot on rear to call for help. FINDINGS: LOWER CHEST: Bronchiectasis and chronic lung changes. Consolidative process in the left lower lobe. See separately dictated chest CT. LIVER: The liver is unremarkable. GALLBLADDER AND BILE DUCTS: Gallbladder is unremarkable. No calcified gallstone. Prominent common bile duct measuring up to 10 mm. SPLEEN: Splenic granuloma. PANCREAS: Atrophic pancreas without inflammation. Proximal pancreatic duct slightly dilated up to 4 mm. ADRENAL GLANDS:  No acute abnormality. KIDNEYS, URETERS AND BLADDER: No stones in the kidneys or ureters. No hydronephrosis. No perinephric or periureteral stranding. Bladder partially obscured by artifact from right hip hardware. GI AND BOWEL: Stomach demonstrates no acute abnormality. Under distention versus mild colitis type changes of the transverse colon and splenic flexure. Diverticular disease of the sigmoid colon without acute wall thickening. There is no bowel obstruction. PERITONEUM AND RETROPERITONEUM: No ascites. No free air. VASCULATURE: Aorta is normal in caliber. Advanced aortic atherosclerosis without aneurysm. LYMPH NODES: No lymphadenopathy. REPRODUCTIVE ORGANS: Hysterectomy. No adnexal mass. BONES AND SOFT TISSUES: Right hip replacement with artifact. No acute osseous abnormality. Scoliosis  and degenerative changes of the spine. Chronic-appearing superior endplate deformities at L1 and L2. No focal soft tissue abnormality. IMPRESSION: 1. Consolidative process in the left lower lobe with small pleural effusions, reference previously dictated chest CT. 2. Collapsed appearance versus Mild colitis-type changes involving the transverse colon and splenic flexure. 3. Sigmoid diverticulosis without acute inflammatory wall thickening. 4. Prominent common bile duct measuring up to 10 mm, with slight prominence of the pancreatic duct, correlate with liver enzymes with follow-up MRCP if deemed appropriate. 5. Chronic-appearing superior endplate deformities at L1 and L2 Electronically signed by: Luke Bun MD 10/08/2024 06:55 PM EDT RP Workstation: [id number]   DG Chest 2 View Result Date: 10/08/2024 EXAM: 2 VIEW(S) XRAY OF THE CHEST 10/08/2024 04:21:24 PM COMPARISON: 02/24/2023 CLINICAL HISTORY: penumonia. Per chart - Pt was sent to ED by Dr. Nichole (PCP) for pneumonia and UTI. Clemens a few nights ago and increased confusion. She lives at [facility] and was seen by staff night of fall. Reports some soreness across hips and shoulders from. Denies LOC ; and was able to scoot on rear to call for help. FINDINGS: LUNGS AND PLEURA: Stable reticular densities are noted throughout both lungs most consistent with scarring. No pulmonary edema. No pleural effusion. No pneumothorax. HEART AND MEDIASTINUM: No acute abnormality of the cardiac and mediastinal silhouettes. BONES AND SOFT TISSUES: No acute osseous abnormality. IMPRESSION: 1. Stable reticular densities throughout both lungs, most consistent with scarring. Electronically signed by: Lynwood Seip MD 10/08/2024 04:34 PM EDT RP Workstation: [id number]     Labs:   Basic Metabolic Panel: Recent Labs  Lab 10/09/24 0329 10/10/24 0742 10/11/24 0344 10/12/24 0654 10/13/24 0631  NA 137 139 141 140 138  K 3.6 3.3* 3.6 3.4* 3.7  CL 104 105 106 105 104   CO2 25 20* 24 23 23   GLUCOSE 115* 117* 133* 116* 103*  BUN 18 10 9 10 11   CREATININE 0.74 0.78 0.72 0.71 0.66  CALCIUM 8.2* 9.0 8.9 8.8* 9.0  MG  --  2.0 1.7 1.7  --    GFR Estimated Creatinine Clearance: 30.1 mL/min (by C-G formula based on SCr of 0.66 mg/dL). Liver Function Tests: Recent Labs  Lab 10/09/24 0329 10/10/24 0742 10/11/24 0344 10/12/24 0654 10/13/24 0631  AST 75* 89* 92* 53* 53*  ALT 38 65* 73* 59* 58*  ALKPHOS 299* 140* 123 101 100  BILITOT 0.5 0.6 0.5 0.5 0.5  PROT 5.4* 6.3* 5.9* 5.8* 6.3*  ALBUMIN 2.8* 3.0* 2.9* 2.7* 3.0*   Recent Labs  Lab 10/08/24 1635  LIPASE 16   No results for input(s): AMMONIA in the last 168 hours. Coagulation profile Recent Labs  Lab 10/09/24 0329  INR 1.2    CBC: Recent Labs  Lab 10/08/24 1635 10/09/24 0329 10/10/24 0742 10/11/24 0344 10/12/24 0654 10/13/24 0631  WBC 21.7* 19.5* 15.6* 14.0*  13.4* 13.8*  NEUTROABS 19.0*  --   --   --   --   --   HGB 11.3* 9.7* 11.3* 10.3* 10.3* 11.1*  HCT 35.3* 29.3* 35.8* 32.5* 31.3* 38.1  MCV 101.4* 102.1* 105.6* 100.9* 102.0* 112.1*  PLT 291 239 306 329 444* 408*   Cardiac Enzymes: No results for input(s): CKTOTAL, CKMB, CKMBINDEX, TROPONINI in the last 168 hours. BNP: Invalid input(s): POCBNP CBG: No results for input(s): GLUCAP in the last 168 hours. D-Dimer No results for input(s): DDIMER in the last 72 hours. Hgb A1c No results for input(s): HGBA1C in the last 72 hours. Lipid Profile No results for input(s): CHOL, HDL, LDLCALC, TRIG, CHOLHDL, LDLDIRECT in the last 72 hours. Thyroid  function studies No results for input(s): TSH, T4TOTAL, T3FREE, THYROIDAB in the last 72 hours.  Invalid input(s): FREET3 Anemia work up No results for input(s): VITAMINB12, FOLATE, FERRITIN, TIBC, IRON, RETICCTPCT in the last 72 hours. Microbiology Recent Results (from the past 240 hours)  Blood culture (routine x 2)     Status:  None   Collection Time: 10/08/24  6:00 PM   Specimen: BLOOD  Result Value Ref Range Status   Specimen Description   Final    BLOOD LEFT ANTECUBITAL Performed at [facility], [address], [place], [place] [address]    Special Requests   Final    BOTTLES DRAWN AEROBIC AND ANAEROBIC Blood Culture results may not be optimal due to an inadequate volume of blood received in culture bottles Performed at [facility], [address], [place], [place] [address]    Culture   Final    NO GROWTH 5 DAYS Performed at [facility], [address] [place], [place] [address]    Report Status 10/13/2024 FINAL  Final  Blood culture (routine x 2)     Status: None   Collection Time: 10/08/24  6:36 PM   Specimen: BLOOD  Result Value Ref Range Status   Specimen Description   Final    BLOOD RIGHT ANTECUBITAL Performed at [facility], [address], [place], [place] [address]    Special Requests   Final    BOTTLES DRAWN AEROBIC AND ANAEROBIC Blood Culture adequate volume Performed at [facility], [address], [place], [place] [address]    Culture   Final    NO GROWTH 5 DAYS Performed at [facility], [address], [place], [place] [address]    Report Status 10/14/2024 FINAL  Final  Resp panel by RT-PCR (RSV, Flu A&B, Covid) Peripheral     Status: None   Collection Time: 10/08/24  6:43 PM   Specimen: Peripheral; Nasal Swab  Result Value Ref Range Status   SARS Coronavirus 2 by RT PCR NEGATIVE NEGATIVE Final    Comment: (NOTE) SARS-CoV-2 target nucleic acids are NOT DETECTED.  The SARS-CoV-2 RNA is generally detectable in upper respiratory specimens during the acute phase of infection. The lowest concentration of SARS-CoV-2 viral copies this assay can detect is 138 copies/mL. A negative result does not preclude SARS-Cov-2 infection and should not be used as the sole basis for treatment or other  patient management decisions. A negative result may occur with  improper specimen collection/handling, submission of specimen other than nasopharyngeal swab, presence of viral mutation(s) within the areas targeted by this assay, and inadequate number of viral copies(<138 copies/mL). A negative result must be combined with clinical observations, patient history, and epidemiological information. The  expected result is Negative.  Fact Sheet for Patients:  bloggercourse.com  Fact Sheet for Healthcare Providers:  seriousbroker.it  This test is no t yet approved or cleared by the [place]  FDA and  has been authorized for detection and/or diagnosis of SARS-CoV-2 by FDA under an Emergency Use Authorization (EUA). This EUA will remain  in effect (meaning this test can be used) for the duration of the COVID-19 declaration under Section 564(b)(1) of the Act, 21 U.S.C.section 360bbb-3(b)(1), unless the authorization is terminated  or revoked sooner.       Influenza A by PCR NEGATIVE NEGATIVE Final   Influenza B by PCR NEGATIVE NEGATIVE Final    Comment: (NOTE) The Xpert Xpress SARS-CoV-2/FLU/RSV plus assay is intended as an aid in the diagnosis of influenza from Nasopharyngeal swab specimens and should not be used as a sole basis for treatment. Nasal washings and aspirates are unacceptable for Xpert Xpress SARS-CoV-2/FLU/RSV testing.  Fact Sheet for Patients: bloggercourse.com  Fact Sheet for Healthcare Providers: seriousbroker.it  This test is not yet approved or cleared by the [place]  FDA and has been authorized for detection and/or diagnosis of SARS-CoV-2 by FDA under an Emergency Use Authorization (EUA). This EUA will remain in effect (meaning this test can be used) for the duration of the COVID-19 declaration under Section 564(b)(1) of the Act, 21 U.S.C. section  360bbb-3(b)(1), unless the authorization is terminated or revoked.     Resp Syncytial Virus by PCR NEGATIVE NEGATIVE Final    Comment: (NOTE) Fact Sheet for Patients: bloggercourse.com  Fact Sheet for Healthcare Providers: seriousbroker.it  This test is not yet approved or cleared by the [place]  FDA and has been authorized for detection and/or diagnosis of SARS-CoV-2 by FDA under an Emergency Use Authorization (EUA). This EUA will remain in effect (meaning this test can be used) for the duration of the COVID-19 declaration under Section 564(b)(1) of the Act, 21 U.S.C. section 360bbb-3(b)(1), unless the authorization is terminated or revoked.  Performed at [facility], [address] [place], [place] [address]   MRSA Next Gen by PCR, Nasal     Status: None   Collection Time: 10/08/24  7:37 PM   Specimen: Nasal Mucosa; Nasal Swab  Result Value Ref Range Status   MRSA by PCR Next Gen NOT DETECTED NOT DETECTED Final    Comment: (NOTE) The GeneXpert MRSA Assay (FDA approved for NASAL specimens only), is one component of a comprehensive MRSA colonization surveillance program. It is not intended to diagnose MRSA infection nor to guide or monitor treatment for MRSA infections. Test performance is not FDA approved in patients less than 20 years old. Performed at [facility], [address] [place], [place] [address]   Gastrointestinal Panel by PCR , Stool     Status: None   Collection Time: 10/09/24 10:51 AM   Specimen: Stool  Result Value Ref Range Status   Campylobacter species NOT DETECTED NOT DETECTED Final   Plesimonas shigelloides NOT DETECTED NOT DETECTED Final   Salmonella species NOT DETECTED NOT DETECTED Final   Yersinia enterocolitica NOT DETECTED NOT DETECTED Final   Vibrio species NOT DETECTED NOT DETECTED Final   Vibrio cholerae NOT DETECTED NOT DETECTED Final    Enteroaggregative E coli (EAEC) NOT DETECTED NOT DETECTED Final   Enteropathogenic E coli (EPEC) NOT DETECTED NOT DETECTED Final   Enterotoxigenic E coli (ETEC) NOT DETECTED NOT DETECTED Final   Shiga like toxin producing E coli (STEC) NOT DETECTED NOT DETECTED  Final   Shigella/Enteroinvasive E coli (EIEC) NOT DETECTED NOT DETECTED Final   Cryptosporidium NOT DETECTED NOT DETECTED Final   Cyclospora cayetanensis NOT DETECTED NOT DETECTED Final   Entamoeba histolytica NOT DETECTED NOT DETECTED Final   Giardia lamblia NOT DETECTED NOT DETECTED Final   Adenovirus F40/41 NOT DETECTED NOT DETECTED Final   Astrovirus NOT DETECTED NOT DETECTED Final   Norovirus GI/GII NOT DETECTED NOT DETECTED Final   Rotavirus A NOT DETECTED NOT DETECTED Final   Sapovirus (I, II, IV, and V) NOT DETECTED NOT DETECTED Final    Comment: Performed at [facility], [address], [place], [place] [address]  C Difficile Quick Screen w PCR reflex     Status: None   Collection Time: 10/09/24 10:51 AM   Specimen: STOOL  Result Value Ref Range Status   C Diff antigen NEGATIVE NEGATIVE Final   C Diff toxin NEGATIVE NEGATIVE Final   C Diff interpretation No C. difficile detected.  Final    Comment: Performed at [facility], [address] [place], [place] [address]     Discharge Instructions:   Discharge Instructions     Diet - low sodium heart healthy   Complete by: As directed    Discharge instructions   Complete by: As directed    Follow-up with your primary care provider i at the skilled nursing facility in 3 to 5 days.  Check blood work at that time.  Complete the course of antibiotic.  Seek medical attention for worsening symptoms.   Increase activity slowly   Complete by: As directed       Allergies as of 10/15/2024   No Known Allergies      Medication List     STOP taking these medications    doxylamine (Sleep) 25 MG tablet Commonly known as: UNISOM    levofloxacin 500 MG tablet Commonly known as: LEVAQUIN       TAKE these medications    acetaminophen  325 MG tablet Commonly known as: TYLENOL  Take 2 tablets (650 mg total) by mouth every 6 (six) hours as needed for mild pain (pain score 1-3) or headache (or Fever >/= 101).   albuterol (2.5 MG/3ML) 0.083% nebulizer solution Commonly known as: PROVENTIL Take 3 mLs (2.5 mg total) by nebulization every 6 (six) hours as needed for wheezing or shortness of breath.   apixaban 5 MG Tabs tablet Commonly known as: ELIQUIS Take 2 tablets (10 mg total) by mouth 2 (two) times daily for 3 days, THEN 1 tablet (5 mg total) 2 (two) times daily. Start taking on: October 15, 2024   AZO CRANBERRY GUMMIES PO Take 2 Pieces by mouth See admin instructions. Chew 2 gummies by mouth in the morning   calcium carbonate 1500 (600 Ca) MG Tabs tablet Commonly known as: OSCAL Take 1,500 mg by mouth daily with breakfast.   citalopram  20 MG tablet Commonly known as: CELEXA  Take 20 mg by mouth daily.   feeding supplement Liqd Take 237 mLs by mouth daily at 2 PM. What changed:  when to take this additional instructions   HYDROcodone -acetaminophen  5-325 MG tablet Commonly known as: NORCO/VICODIN Take 1 tablet by mouth every 8 (eight) hours as needed for moderate pain (pain score 4-6). What changed: reasons to take this   latanoprost  0.005 % ophthalmic solution Commonly known as: XALATAN  Place 1 drop into both eyes at bedtime.   Mucinex Maximum Strength 1200 MG Tb12 Generic drug: Guaifenesin Take 1,200 mg by mouth every 12 (  twelve) hours as needed (to loosen phlegm or thin bronchial secretions).   ondansetron  4 MG tablet Commonly known as: ZOFRAN  Take 1 tablet (4 mg total) by mouth every 6 (six) hours as needed for nausea.   senna 8.6 MG Tabs tablet Commonly known as: SENOKOT Take 1 tablet (8.6 mg total) by mouth daily. What changed:  when to take this reasons to take this   Skin Prep Wipes  Misc Apply topically. Apply to Heels topically every day and evening shift for Skin care   Systane Ultra 0.4-0.3 % Soln Generic drug: Polyethyl Glycol-Propyl Glycol Place 1 drop into both eyes 4 (four) times daily as needed (for dryness).   zolpidem  5 MG tablet Commonly known as: AMBIEN  Take 1 tablet (5 mg total) by mouth at bedtime.        Follow-up Information     Laura Senior, MD .   Specialty: Endocrinology Contact information: [address] [place] [address] [phone]                  Time coordinating discharge: 39 minutes  Signed:  Johnathon Mittal  [facility] 10/15/2024, 11:47 AM

## 2024-10-15 NOTE — TOC Progression Note (Addendum)
 Transition of Care [facility]) - Progression Note    Patient Details  Name: Laura Hancock MRN: [id number] Date of Birth: [birth year]  [facility] CM/SW Contact  Toy LITTIE Agar, RN Phone Number:[phone]  10/15/2024, 9:55 AM  Clinical Narrative:    Insurance shara is still pending.   1030 Daughter Patti at bedside states that patient wants to go back too [facility] [place]. CM has updated the team and reached out to [facility] who has updated Tourist Information Centre Manager at [facility]. CM has attempted to call [facility] to confirm patient is ready to discharge to facility. There is no answer. Message has been left. Awaiting response.   Expected Discharge Plan: Home w Home Health Services Barriers to Discharge: Continued Medical Work up               Expected Discharge Plan and Services In-house Referral: NA Discharge Planning Services: CM Consult Post Acute Care Choice: Durable Medical Equipment, Home Health Living arrangements for the past 2 months: Independent Living Facility                 DME Arranged: N/A DME Agency: NA       HH Arranged: NA HH Agency: NA         Social Drivers of Health (SDOH) Interventions SDOH Screenings   Food Insecurity: No Food Insecurity (10/09/2024)  Housing: Low Risk  (10/09/2024)  Transportation Needs: No Transportation Needs (10/09/2024)  Utilities: Not At Risk (10/09/2024)  Social Connections: Moderately Isolated (10/12/2024)  Tobacco Use: Low Risk  (10/08/2024)    Readmission Risk Interventions    10/10/2024    1:35 PM  Readmission Risk Prevention Plan  Post Dischage Appt Complete  Medication Screening Complete  Transportation Screening Complete

## 2024-10-15 NOTE — Progress Notes (Signed)
 PROGRESS NOTE  Laura Hancock FMW:[id number] DOB: [birth year] DOA: 10/08/2024 PCP: Nichole Senior, MD   LOS: 7 days   Brief narrative:  Laura Hancock is a 89 y.o. female with medical history significant of CKD stage IIIa, hyperlipidemia, generalized anxiety disorder, insomnia and carotid stenosis presented to the hospital with cough, frequent urination and hip joint pain after sustaining a mechanical fall.  Patient was also taking Levaquin for possible pneumonia and was more confused with increasing left hip pain so she decided to come to the hospital.  In the ED, patient was noted to have multifocal pneumonia with small acute pulmonary embolism in the right lower lobe possible colitis in the transverse colon.  Patient was then started on IV heparin antibiotics and was out of the hospital for further evaluation and treatment.  At this time patient has remained stable and is awaiting for skilled nursing facility placement.    Assessment/Plan: Principal Problem:   Sepsis due to pneumonia [facility]) Active Problems:   CAP (community acquired pneumonia)   Acute pulmonary embolism (HCC)   Colitis   Insomnia   CKD (chronic kidney disease) stage 3, GFR 30-59 ml/min (HCC)   Fall at home, initial encounter   Acute kidney injury superimposed on chronic kidney disease   Transaminitis   Hyponatremia   Dilation of pancreatic duct   Joint pain of left hip on movement   Hyperlipidemia   Generalized anxiety disorder   Carotid stenosis   Severe Sepsis secondary to community-acquired pneumonia Acute hypoxic respiratory failure Acute metabolic encephalopathy secondary to pneumonia  CTA of the chest showed small nonocclusive PE with right ventricular strain, multifocal pneumonia.  Completed 7-day course of Augmentin and doxycycline .   On room air.  Acute pulmonary embolism CTA chest showed small nonocclusive PE and right ventricular strain.   No evidence of DVT on duplex ultrasound of the lower  extremities but superficial vein thrombosis.  Was initially on heparin drip which has been transitioned to Eliquis at this time.  Hemodynamically stable.   on room air.  No chest pain or acute issues.   Left lower extremity SVT -left small saphenous superficial venous thrombus seen on ultrasound.     Hypokalemia -resolved.  Latest potassium of 3.7   Elevated troponin-secondary to demand ischemia in the context of sepsis, acute pulmonary embolism, hypoxia.  Elevated troponin 21.  EKG no acute ischemic changes.  No chest pain.     Colitis -- transverse colon and splenic flexure. Negative C. difficile and GI panel.  Received antibiotics.  Blood cultures negative.  Clinically improved   CKD stage III A AKI ruled out.  Creatinine of 0.6 at this time   Thyroid  nodule - TSH and free T4 are normal. CT cervical spine showed 2.2 cm right thyroid  solid nodule.  Recommend Outpatient thyroid  ultrasound for characterization.      Fall at home/Left-sided hip joint pain from fall Had a mechanical fall.  CT scan without any fracture or dislocation.  PT OT recommending skilled nursing facility placement.  Continue fall precautions.   Hyponatremia. Resolved latest sodium of 138    Elevated LFTs Dilation of the hepatic and pancreatic duct CT abdomen pelvis showing bile duct dilation 10 mm small pancreatic dilation..  Patient asymptomatic.  Previous provider had spoken with the patient's family and deferring MRCP or further testing at this time.  Recommend follow-up with GI outpatient on discharge.    History of glucoma Continue ocular drops   Generalized anxiety disorder - Continue Celexa   Insomnia Has been resumed on Ambien  from home.  Melatonin DC'd   DVT prophylaxis: Place and maintain sequential compression device Start: 10/08/24 2105 SCDs Start: 10/08/24 1951 Place TED hose Start: 10/08/24 1951 apixaban (ELIQUIS) tablet 10 mg  apixaban (ELIQUIS) tablet 5 mg   Disposition: Skilled nursing  facility.  Medically stable for disposition  Status is: Inpatient Remains inpatient appropriate because: Need for rehabilitation    Code Status:     Code Status: Limited: Do not attempt resuscitation (DNR) -DNR-LIMITED -Do Not Intubate/DNI   Family Communication: None at bedside  Consultants: None  Procedures: None  Anti-infectives:  Augmentin and doxycycline -completed the course  Anti-infectives (From admission, onward)    Start     Dose/Rate Route Frequency Ordered Stop   10/12/24 1200  amoxicillin-clavulanate (AUGMENTIN) 875-125 MG per tablet 1 tablet        1 tablet Oral Every 12 hours 10/12/24 0933 10/14/24 2222   10/09/24 1000  doxycycline  (VIBRA -TABS) tablet 100 mg        100 mg Oral Every 12 hours 10/09/24 0908 10/14/24 0926   10/09/24 0900  doxycycline  (VIBRAMYCIN ) 100 mg in sodium chloride  0.9 % 250 mL IVPB  Status:  Discontinued        100 mg 125 mL/hr over 120 Minutes Intravenous Every 12 hours 10/08/24 1944 10/08/24 1949   10/08/24 2200  piperacillin-tazobactam (ZOSYN) IVPB 3.375 g  Status:  Discontinued        3.375 g 12.5 mL/hr over 240 Minutes Intravenous Every 8 hours 10/08/24 1949 10/12/24 0933   10/08/24 1948  doxycycline  (VIBRAMYCIN ) 100 mg in sodium chloride  0.9 % 250 mL IVPB  Status:  Discontinued        100 mg 125 mL/hr over 120 Minutes Intravenous Every 12 hours 10/08/24 1949 10/09/24 0908   10/08/24 1915  azithromycin  (ZITHROMAX ) 500 mg in sodium chloride  0.9 % 250 mL IVPB        500 mg 250 mL/hr over 60 Minutes Intravenous  Once 10/08/24 1914 10/08/24 2205   10/08/24 1915  cefTRIAXone  (ROCEPHIN ) 1 g in sodium chloride  0.9 % 100 mL IVPB        1 g 200 mL/hr over 30 Minutes Intravenous  Once 10/08/24 1914 10/08/24 2200        Subjective: Today, patient was seen and examined at bedside.  Anxious about getting discharged.  Denies any shortness of breath dyspnea chest pain fever chills or rigor.  Patient's daughter at  bedside  Objective: Vitals:   10/14/24 2025 10/15/24 0541  BP: (!) 114/54 (!) 118/56  Pulse: 66 72  Resp: 16 16  Temp: 98.2 F (36.8 C) 97.8 F (36.6 C)  SpO2: 93% 96%    Intake/Output Summary (Last 24 hours) at 10/15/2024 1136 Last data filed at 10/15/2024 1030 Gross per 24 hour  Intake 363 ml  Output --  Net 363 ml   Filed Weights   10/08/24 1932  Weight: 48.5 kg   Body mass index is 18.95 kg/m.   Physical Exam: GENERAL: Alert awake and Communicative.  Thinly built, hard of hearing.  Anxious.SABRA HENT: No scleral pallor or icterus. Pupils equally reactive to light. Oral mucosa is moist NECK: is supple, no gross swelling noted. CHEST: Clear to auscultation.  Decreased breath sounds bilaterally CVS: S1 and S2 heard, no murmur. Regular rate and rhythm.  ABDOMEN: Soft, non-tender, bowel sounds are present. EXTREMITIES: No edema. CNS: Cranial nerves are intact.  Moves all extremities  SKIN: warm and dry without rashes.  Data  Review: I have personally reviewed the following laboratory data and studies,  CBC: Recent Labs  Lab 10/08/24 1635 10/09/24 0329 10/10/24 0742 10/11/24 0344 10/12/24 0654 10/13/24 0631  WBC 21.7* 19.5* 15.6* 14.0* 13.4* 13.8*  NEUTROABS 19.0*  --   --   --   --   --   HGB 11.3* 9.7* 11.3* 10.3* 10.3* 11.1*  HCT 35.3* 29.3* 35.8* 32.5* 31.3* 38.1  MCV 101.4* 102.1* 105.6* 100.9* 102.0* 112.1*  PLT 291 239 306 329 444* 408*   Basic Metabolic Panel: Recent Labs  Lab 10/09/24 0329 10/10/24 0742 10/11/24 0344 10/12/24 0654 10/13/24 0631  NA 137 139 141 140 138  K 3.6 3.3* 3.6 3.4* 3.7  CL 104 105 106 105 104  CO2 25 20* 24 23 23   GLUCOSE 115* 117* 133* 116* 103*  BUN 18 10 9 10 11   CREATININE 0.74 0.78 0.72 0.71 0.66  CALCIUM 8.2* 9.0 8.9 8.8* 9.0  MG  --  2.0 1.7 1.7  --    Liver Function Tests: Recent Labs  Lab 10/09/24 0329 10/10/24 0742 10/11/24 0344 10/12/24 0654 10/13/24 0631  AST 75* 89* 92* 53* 53*  ALT 38 65* 73* 59*  58*  ALKPHOS 299* 140* 123 101 100  BILITOT 0.5 0.6 0.5 0.5 0.5  PROT 5.4* 6.3* 5.9* 5.8* 6.3*  ALBUMIN 2.8* 3.0* 2.9* 2.7* 3.0*   Recent Labs  Lab 10/08/24 1635  LIPASE 16   No results for input(s): AMMONIA in the last 168 hours. Cardiac Enzymes: No results for input(s): CKTOTAL, CKMB, CKMBINDEX, TROPONINI in the last 168 hours. BNP (last 3 results) No results for input(s): BNP in the last 8760 hours.  ProBNP (last 3 results) Recent Labs    10/08/24 1949  PROBNP 1,984.0*    CBG: No results for input(s): GLUCAP in the last 168 hours. Recent Results (from the past 240 hours)  Blood culture (routine x 2)     Status: None   Collection Time: 10/08/24  6:00 PM   Specimen: BLOOD  Result Value Ref Range Status   Specimen Description   Final    BLOOD LEFT ANTECUBITAL Performed at [facility], [address], [place], [place] [address]    Special Requests   Final    BOTTLES DRAWN AEROBIC AND ANAEROBIC Blood Culture results may not be optimal due to an inadequate volume of blood received in culture bottles Performed at [facility], [address], [place], [place] [address]    Culture   Final    NO GROWTH 5 DAYS Performed at [facility], [address] [place], [place] [address]    Report Status 10/13/2024 FINAL  Final  Blood culture (routine x 2)     Status: None   Collection Time: 10/08/24  6:36 PM   Specimen: BLOOD  Result Value Ref Range Status   Specimen Description   Final    BLOOD RIGHT ANTECUBITAL Performed at [facility], [address], [place], [place] [address]    Special Requests   Final    BOTTLES DRAWN AEROBIC AND ANAEROBIC Blood Culture adequate volume Performed at [facility], [address] [place], [place] [address]    Culture   Final    NO GROWTH 5 DAYS Performed at [facility], [address] [place], [place] [address]    Report Status  10/14/2024 FINAL  Final  Resp panel by RT-PCR (RSV, Flu A&B, Covid) Peripheral  Status: None   Collection Time: 10/08/24  6:43 PM   Specimen: Peripheral; Nasal Swab  Result Value Ref Range Status   SARS Coronavirus 2 by RT PCR NEGATIVE NEGATIVE Final    Comment: (NOTE) SARS-CoV-2 target nucleic acids are NOT DETECTED.  The SARS-CoV-2 RNA is generally detectable in upper respiratory specimens during the acute phase of infection. The lowest concentration of SARS-CoV-2 viral copies this assay can detect is 138 copies/mL. A negative result does not preclude SARS-Cov-2 infection and should not be used as the sole basis for treatment or other patient management decisions. A negative result may occur with  improper specimen collection/handling, submission of specimen other than nasopharyngeal swab, presence of viral mutation(s) within the areas targeted by this assay, and inadequate number of viral copies(<138 copies/mL). A negative result must be combined with clinical observations, patient history, and epidemiological information. The expected result is Negative.  Fact Sheet for Patients:  bloggercourse.com  Fact Sheet for Healthcare Providers:  seriousbroker.it  This test is no t yet approved or cleared by the [place]  FDA and  has been authorized for detection and/or diagnosis of SARS-CoV-2 by FDA under an Emergency Use Authorization (EUA). This EUA will remain  in effect (meaning this test can be used) for the duration of the COVID-19 declaration under Section 564(b)(1) of the Act, 21 U.S.C.section 360bbb-3(b)(1), unless the authorization is terminated  or revoked sooner.       Influenza A by PCR NEGATIVE NEGATIVE Final   Influenza B by PCR NEGATIVE NEGATIVE Final    Comment: (NOTE) The Xpert Xpress SARS-CoV-2/FLU/RSV plus assay is intended as an aid in the diagnosis of influenza from Nasopharyngeal swab specimens  and should not be used as a sole basis for treatment. Nasal washings and aspirates are unacceptable for Xpert Xpress SARS-CoV-2/FLU/RSV testing.  Fact Sheet for Patients: bloggercourse.com  Fact Sheet for Healthcare Providers: seriousbroker.it  This test is not yet approved or cleared by the [place]  FDA and has been authorized for detection and/or diagnosis of SARS-CoV-2 by FDA under an Emergency Use Authorization (EUA). This EUA will remain in effect (meaning this test can be used) for the duration of the COVID-19 declaration under Section 564(b)(1) of the Act, 21 U.S.C. section 360bbb-3(b)(1), unless the authorization is terminated or revoked.     Resp Syncytial Virus by PCR NEGATIVE NEGATIVE Final    Comment: (NOTE) Fact Sheet for Patients: bloggercourse.com  Fact Sheet for Healthcare Providers: seriousbroker.it  This test is not yet approved or cleared by the [place]  FDA and has been authorized for detection and/or diagnosis of SARS-CoV-2 by FDA under an Emergency Use Authorization (EUA). This EUA will remain in effect (meaning this test can be used) for the duration of the COVID-19 declaration under Section 564(b)(1) of the Act, 21 U.S.C. section 360bbb-3(b)(1), unless the authorization is terminated or revoked.  Performed at [facility], [address], [place], [place] [address]   MRSA Next Gen by PCR, Nasal     Status: None   Collection Time: 10/08/24  7:37 PM   Specimen: Nasal Mucosa; Nasal Swab  Result Value Ref Range Status   MRSA by PCR Next Gen NOT DETECTED NOT DETECTED Final    Comment: (NOTE) The GeneXpert MRSA Assay (FDA approved for NASAL specimens only), is one component of a comprehensive MRSA colonization surveillance program. It is not intended to diagnose MRSA infection nor to guide or monitor treatment for MRSA  infections. Test performance is not FDA approved in  patients less than 90 years old. Performed at [facility], [address], [place], [place] [address]   Gastrointestinal Panel by PCR , Stool     Status: None   Collection Time: 10/09/24 10:51 AM   Specimen: Stool  Result Value Ref Range Status   Campylobacter species NOT DETECTED NOT DETECTED Final   Plesimonas shigelloides NOT DETECTED NOT DETECTED Final   Salmonella species NOT DETECTED NOT DETECTED Final   Yersinia enterocolitica NOT DETECTED NOT DETECTED Final   Vibrio species NOT DETECTED NOT DETECTED Final   Vibrio cholerae NOT DETECTED NOT DETECTED Final   Enteroaggregative E coli (EAEC) NOT DETECTED NOT DETECTED Final   Enteropathogenic E coli (EPEC) NOT DETECTED NOT DETECTED Final   Enterotoxigenic E coli (ETEC) NOT DETECTED NOT DETECTED Final   Shiga like toxin producing E coli (STEC) NOT DETECTED NOT DETECTED Final   Shigella/Enteroinvasive E coli (EIEC) NOT DETECTED NOT DETECTED Final   Cryptosporidium NOT DETECTED NOT DETECTED Final   Cyclospora cayetanensis NOT DETECTED NOT DETECTED Final   Entamoeba histolytica NOT DETECTED NOT DETECTED Final   Giardia lamblia NOT DETECTED NOT DETECTED Final   Adenovirus F40/41 NOT DETECTED NOT DETECTED Final   Astrovirus NOT DETECTED NOT DETECTED Final   Norovirus GI/GII NOT DETECTED NOT DETECTED Final   Rotavirus A NOT DETECTED NOT DETECTED Final   Sapovirus (I, II, IV, and V) NOT DETECTED NOT DETECTED Final    Comment: Performed at [facility], [address] [place], [place] [address]  C Difficile Quick Screen w PCR reflex     Status: None   Collection Time: 10/09/24 10:51 AM   Specimen: STOOL  Result Value Ref Range Status   C Diff antigen NEGATIVE NEGATIVE Final   C Diff toxin NEGATIVE NEGATIVE Final   C Diff interpretation No C. difficile detected.  Final    Comment: Performed at [facility], [address]  [place], [place] [address]     Studies: No results found.    Alekai Pocock, MD  [facility] 10/15/2024  If 7PM-7AM, please contact night-coverage

## 2024-10-15 NOTE — Progress Notes (Signed)
 Physical Therapy Treatment Patient Details Name: Laura Hancock MRN: [id number] DOB: September 13, 1924 Today's Date: 10/15/2024   History of Present Illness Patient is a 89 year old female who presented from ILF after a fall  a few days prior, complains of hip left pain,small nonocclusive PE,  treated for possible pneumonia,  UTI symptoms,AKI. PMH: back pain, CKD, insomnia, RTHA, periprosthetic femur fx    PT Comments   Pt admitted with above diagnosis.  Pt currently with functional limitations due to the deficits listed below (see PT Problem List). Pt is motivated to d/c today, pt seated in recliner and daughter present. Pt reports no dizziness, pain nor SOB during PT session. Pt is S and min cues for proper UE and AD placement for sit to stand  from recliner, gait tasks CGA and progressing to close S min cues for posture and RW management in hallway 110 feet. Pt returned to recliner, all needs in place and daughter present. Patient will benefit from continued inpatient follow up therapy, <3 hours/day. Pt will benefit from acute skilled PT to increase their independence and safety with mobility to allow discharge.      If plan is discharge home, recommend the following: A little help with bathing/dressing/bathroom;Assistance with cooking/housework;Assist for transportation;Help with stairs or ramp for entrance;A little help with walking and/or transfers   Can travel by private vehicle     Yes  Equipment Recommendations  None recommended by PT    Recommendations for Other Services       Precautions / Restrictions Precautions Precautions: Fall Restrictions Weight Bearing Restrictions Per Provider Order: No     Mobility  Bed Mobility               General bed mobility comments: pt seated in recliner when PT arrived.    Transfers Overall transfer level: Needs assistance Equipment used: Rolling walker (2 wheels) Transfers: Sit to/from Stand Sit to Stand: Supervision            General transfer comment: min cues for proper UE and AD placement    Ambulation/Gait Ambulation/Gait assistance: Contact guard assist, Supervision Gait Distance (Feet): 110 Feet Assistive device: Rolling walker (2 wheels) Gait Pattern/deviations: Step-through pattern, Trunk flexed Gait velocity: decreased     General Gait Details: slight trunk flexion min cues for safety and RW management, pt able to progress from CGA to close S and no overt LOB noted and no reports of shortness of breath, no apparent signs or symptoms   Stairs             Wheelchair Mobility     Tilt Bed    Modified Rankin (Stroke Patients Only)       Balance Overall balance assessment: Needs assistance Sitting-balance support: No upper extremity supported, Feet supported Sitting balance-Leahy Scale: Good     Standing balance support: Bilateral upper extremity supported, During functional activity Standing balance-Leahy Scale: Fair Standing balance comment: static standing no UE support                            Communication Communication Communication: No apparent difficulties Factors Affecting Communication: Hearing impaired  Cognition Arousal: Alert Behavior During Therapy: WFL for tasks assessed/performed   PT - Cognitive impairments: Orientation   Orientation impairments: Time, Situation                     Following commands: Intact      Cueing Cueing Techniques:  Verbal cues  Exercises      General Comments        Pertinent Vitals/Pain Pain Assessment Pain Assessment: No/denies pain    Home Living                          Prior Function            PT Goals (current goals can now be found in the care plan section) Acute Rehab PT Goals Patient Stated Goal: return  to apt PT Goal Formulation: With patient Time For Goal Achievement: 10/23/24 Potential to Achieve Goals: Good Progress towards PT goals: Progressing toward goals     Frequency    Min 2X/week      PT Plan      Co-evaluation              AM-PAC PT 6 Clicks Mobility   Outcome Measure  Help needed turning from your back to your side while in a flat bed without using bedrails?: A Little Help needed moving from lying on your back to sitting on the side of a flat bed without using bedrails?: A Little Help needed moving to and from a bed to a chair (including a wheelchair)?: A Little Help needed standing up from a chair using your arms (e.g., wheelchair or bedside chair)?: A Little Help needed to walk in hospital room?: A Little Help needed climbing 3-5 steps with a railing? : A Lot 6 Click Score: 17    End of Session Equipment Utilized During Treatment: Gait belt Activity Tolerance: Patient tolerated treatment well;No increased pain Patient left: in chair;with call bell/phone within reach;with chair alarm set;with family/visitor present Nurse Communication: Mobility status PT Visit Diagnosis: Unsteadiness on feet (R26.81)     Time: 8850-8794 PT Time Calculation (min) (ACUTE ONLY): 16 min  Charges:    $Gait Training: 8-22 mins PT General Charges $$ ACUTE PT VISIT: 1 Visit                     [place], PT Acute Rehab    [place] VEAR Drone 10/15/2024, 12:31 PM

## 2024-10-15 NOTE — TOC Transition Note (Signed)
 Transition of Care [facility]) - Discharge Note   Patient Details  Name: Laura Hancock MRN: [id number] Date of Birth: 03/07/1924  Transition of Care [facility]) CM/SW Contact:  Toy LITTIE Agar, RN Phone Number:[phone]  10/15/2024, 2:04 PM   Clinical Narrative:    Patient discharging to [facility]. Transportation has been arranged per PTAR. Information for report has been reported to nurse. Daughter Willy at bedside made aware of discharge plan. No other needs noted . CM will sign off.    Final next level of care: Other (comment) (SNF [facility]) Barriers to Discharge: No Barriers Identified   Patient Goals and CMS Choice Patient states their goals for this hospitalization and ongoing recovery are:: Per pt daughter Juleah [place], she would prefer pt return to Independent Living not Skilled Nursing CMS Medicare.gov Compare Post Acute Care list provided to:: Patient Represenative (must comment) (Pt daughter) Choice offered to / list presented to : Adult Children, Patient [place] ownership interest in [facility].provided to:: Adult Children    Discharge Placement              Patient chooses bed at: [facility] Patient to be transferred to facility by: PTAR Name of family member notified: Willy Dubin at bedside Patient and family notified of of transfer: 10/15/24  Discharge Plan and Services Additional resources added to the After Visit Summary for   In-house Referral: NA Discharge Planning Services: CM Consult Post Acute Care Choice: NA          DME Arranged: N/A DME Agency: NA       HH Arranged: NA HH Agency: NA        Social Drivers of Health (SDOH) Interventions SDOH Screenings   Food Insecurity: No Food Insecurity (10/09/2024)  Housing: Low Risk  (10/09/2024)  Transportation Needs: No Transportation Needs (10/09/2024)  Utilities: Not At Risk (10/09/2024)  Social Connections: Moderately Isolated (10/12/2024)  Tobacco Use: Low Risk   (10/08/2024)     Readmission Risk Interventions    10/10/2024    1:35 PM  Readmission Risk Prevention Plan  Post Dischage Appt Complete  Medication Screening Complete  Transportation Screening Complete

## 2024-10-15 NOTE — Progress Notes (Signed)
 Occupational Therapy Treatment Patient Details Name: Laura Hancock MRN: [id number] DOB: [birth year] Today's Date: 10/15/2024   History of present illness Patient is a 89 year old female who presented from [facility] after a fall  a few days prior, complains of hip left pain,small nonocclusive PE,  treated for possible pneumonia, AKI. PMH: back pain, CKD, insomnia, RTHA, periprosthetic femur fx   OT comments  The pt was seen for functional strengthening and progression of ADL participation. She required CGA to min assist for toileting at bathroom level & for upper body grooming standing at the sink. She reported fatigue with progressive activity. She was educated on taking therapeutic rest breaks as needed, as well as implementing deep breathing exercises using an incentive spirometer. Continue OT plan of care. Patient will benefit from continued inpatient follow up therapy, <3 hours/day.       If plan is discharge home, recommend the following:  A little help with walking and/or transfers;A little help with bathing/dressing/bathroom;Assistance with cooking/housework;Assist for transportation   Equipment Recommendations  None recommended by OT    Recommendations for Other Services      Precautions / Restrictions Precautions Precautions: Fall Restrictions Weight Bearing Restrictions Per Provider Order: No       Mobility Bed Mobility               General bed mobility comments: pt was received seated in the chair    Transfers Overall transfer level: Needs assistance Equipment used: Rolling walker (2 wheels) Transfers: Sit to/from Stand Sit to Stand: Contact guard assist                 Balance     Sitting balance-Leahy Scale: Good         Standing balance comment: CGA with rolling walker           ADL either performed or assessed with clinical judgement   ADL Overall ADL's : Needs assistance/impaired     Grooming: Contact guard assist;Standing;Cueing for  safety Grooming Details (indicate cue type and reason): The pt performed hand washing and teeth brushing in standing at sink level. She required a verbal cue to step closer to the sink in order to perform task.                 Toilet Transfer: Rolling walker (2 wheels);Grab bars;Cueing for safety;Contact guard Marine Scientist Details (indicate cue type and reason): The pt required steadying assist in standing and min assist for clothing management. She performed seated hygiene with SBA. Toileting- Clothing Manipulation and Hygiene: Minimal assistance;Sit to/from stand Toileting - Clothing Manipulation Details (indicate cue type and reason): The pt required steadying assist in standing and min assist for clothing management. She performed seated hygiene with SBA.                      Communication Communication Communication: No apparent difficulties Factors Affecting Communication: Hearing impaired   Cognition Arousal: Alert Behavior During Therapy: WFL for tasks assessed/performed        Following commands: Intact        Cueing   Cueing Techniques: Verbal cues             Pertinent Vitals/ Pain       Pain Assessment Pain Assessment: No/denies pain   Frequency  Min 2X/week        Progress Toward Goals  OT Goals(current goals can now be found in the care plan section)  Acute Rehab OT Goals OT Goal Formulation: With patient/family Time For Goal Achievement: 10/25/24 Potential to Achieve Goals: Good  Plan         AM-PAC OT 6 Clicks Daily Activity     Outcome Measure   Help from another person eating meals?: None Help from another person taking care of personal grooming?: A Little Help from another person toileting, which includes using toliet, bedpan, or urinal?: A Little Help from another person bathing (including washing, rinsing, drying)?: A Little Help from another person to put on and taking off regular upper body  clothing?: None Help from another person to put on and taking off regular lower body clothing?: A Little 6 Click Score: 20    End of Session Equipment Utilized During Treatment: Rolling walker (2 wheels)  OT Visit Diagnosis: Muscle weakness (generalized) (M62.81);Other abnormalities of gait and mobility (R26.89)   Activity Tolerance Patient tolerated treatment well   Patient Left in chair;with call bell/phone within reach;with family/visitor present   Nurse Communication Mobility status        Time: 8867-8851 OT Time Calculation (min): 16 min  Charges: OT General Charges $OT Visit: 1 Visit OT Treatments $Self Care/Home Management : 8-22 mins    [name], OTR/L 10/15/2024, 3:03 PM

## 2024-10-15 NOTE — Plan of Care (Signed)

## 2024-10-15 NOTE — Progress Notes (Signed)
 Attempted to call report to [facility] at [phone] ext [phone]. No answer, voicemail left with callback information.

## 2024-10-16 ENCOUNTER — Encounter: Payer: Self-pay | Admitting: Nurse Practitioner

## 2024-10-16 ENCOUNTER — Non-Acute Institutional Stay (SKILLED_NURSING_FACILITY): Payer: Self-pay | Admitting: Nurse Practitioner

## 2024-10-16 DIAGNOSIS — I2699 Other pulmonary embolism without acute cor pulmonale: Secondary | ICD-10-CM | POA: Diagnosis not present

## 2024-10-16 DIAGNOSIS — E041 Nontoxic single thyroid nodule: Secondary | ICD-10-CM

## 2024-10-16 DIAGNOSIS — K5901 Slow transit constipation: Secondary | ICD-10-CM

## 2024-10-16 DIAGNOSIS — F411 Generalized anxiety disorder: Secondary | ICD-10-CM | POA: Diagnosis not present

## 2024-10-16 DIAGNOSIS — N1831 Chronic kidney disease, stage 3a: Secondary | ICD-10-CM | POA: Diagnosis not present

## 2024-10-16 DIAGNOSIS — G4700 Insomnia, unspecified: Secondary | ICD-10-CM

## 2024-10-16 DIAGNOSIS — J189 Pneumonia, unspecified organism: Secondary | ICD-10-CM

## 2024-10-16 DIAGNOSIS — M25552 Pain in left hip: Secondary | ICD-10-CM

## 2024-10-16 DIAGNOSIS — K529 Noninfective gastroenteritis and colitis, unspecified: Secondary | ICD-10-CM

## 2024-10-16 DIAGNOSIS — E782 Mixed hyperlipidemia: Secondary | ICD-10-CM

## 2024-10-16 DIAGNOSIS — A419 Sepsis, unspecified organism: Secondary | ICD-10-CM

## 2024-10-16 DIAGNOSIS — R7401 Elevation of levels of liver transaminase levels: Secondary | ICD-10-CM

## 2024-10-16 DIAGNOSIS — K8689 Other specified diseases of pancreas: Secondary | ICD-10-CM | POA: Diagnosis not present

## 2024-10-16 DIAGNOSIS — E871 Hypo-osmolality and hyponatremia: Secondary | ICD-10-CM | POA: Diagnosis not present

## 2024-10-16 NOTE — Assessment & Plan Note (Signed)
 GAD/insomnia, takes his Celexa , Ambien 

## 2024-10-16 NOTE — Progress Notes (Signed)
 Location:   [facility] Room Number: 58 Place of Service:  SNF (31) Provider: Larwance Shahira Fiske NP  Nichole Senior, MD  Patient Care Team: Nichole Senior, MD as PCP - General (Endocrinology) Mindi Mt, MD (Inactive) as Consulting Physician (Anesthesiology) Devere Lonni Righter, MD as Consulting Physician (Urology)  Extended Emergency Contact Information Primary Emergency Contact: [facility] Phone: [phone] Mobile Phone: [phone] Relation: Daughter Secondary Emergency Contact: Altamease Willy MADRID [place] Home Phone: [phone] Mobile Phone: [phone] Relation: Daughter  Code Status:  DNR Goals of care: Advanced Directive information    10/14/2024    7:29 AM  Advanced Directives  Would patient like information on creating a medical advance directive? No - Patient declined     Chief Complaint  Patient presents with   Acute Visit    Medication review following hospital stay    HPI:  Pt is a 89 y.o. female seen today for an acute visit for medication review following hospital stay  Hospitalized 10/08/2024 - 10/15/2024 for sepsis due to pneumonia, acute pulmonary embolism, and AKI.   PE, CTA of the chest showed small nonocclusive PE with right ventricular strain, Doppler showed  the left lower extremity small saphenous superficial venous thrombus.  Placed on Eliquis  PNA, fully treated with 7-day course of Augmentin and doxycycline , wbc 13.8 10/13/24  AKI on CKD, Bun/creat 11/0.66 10/13/24  Transaminitis, AST 53, ALT 58 trending down  Hyponatremia, Na 138 10/13/24  Dilation of the pancreatic duct, GI as outpatient, CT abdomen/pelvis showed bile duct dilation 10 mm a small pancreatic duct dilation, previous provider has spoken with the patient/family for MRCP  OA left hip, resulted of fall at home, CT scan ruled out fracture or dislocation, Norco prn  HLD, LDL 139 03/04/20  GAD/insomnia, takes his Celexa ,  Ambien   Mild chronic ulcerative colitis, transverse colon and splenic flexure, treated with antibiotics, negative C. difficile and GI panel.  Improved  Thyroid  nodule, normal TSH and free T4, CT cervical spine showed 2.2 cm right thyroid  solid nodule, recommend ultrasound. TSH 2.13 10/08/24  Constipation, taking Senna     Past Medical History:  Diagnosis Date   Arthritis    Back pain    Chronic kidney disease, stage 3a (HCC)    Insomnia    Leg pain    Neck pain    History reviewed. No pertinent surgical history.  No Known Allergies  Allergies as of 10/16/2024   No Known Allergies      Medication List        Accurate as of October 16, 2024 11:59 PM. If you have any questions, ask your nurse or doctor.          acetaminophen  325 MG tablet Commonly known as: TYLENOL  Take 2 tablets (650 mg total) by mouth every 6 (six) hours as needed for mild pain (pain score 1-3) or headache (or Fever >/= 101).   albuterol (2.5 MG/3ML) 0.083% nebulizer solution Commonly known as: PROVENTIL Take 3 mLs (2.5 mg total) by nebulization every 6 (six) hours as needed for wheezing or shortness of breath.   apixaban 5 MG Tabs tablet Commonly known as: ELIQUIS Take 2 tablets (10 mg total) by mouth 2 (two) times daily for 3 days, THEN 1 tablet (5 mg total) 2 (two) times daily. Start taking on: October 15, 2024   AZO CRANBERRY GUMMIES PO Take 2 Pieces by mouth See admin instructions. Chew 2 gummies by mouth  in the morning   calcium carbonate 1500 (600 Ca) MG Tabs tablet Commonly known as: OSCAL Take 1,500 mg by mouth daily with breakfast.   citalopram  20 MG tablet Commonly known as: CELEXA  Take 20 mg by mouth daily.   feeding supplement Liqd Take 237 mLs by mouth daily at 2 PM.   HYDROcodone -acetaminophen  5-325 MG tablet Commonly known as: NORCO/VICODIN Take 1 tablet by mouth every 8 (eight) hours as needed for moderate pain (pain score 4-6).   latanoprost  0.005 % ophthalmic  solution Commonly known as: XALATAN  Place 1 drop into both eyes at bedtime.   Mucinex Maximum Strength 1200 MG Tb12 Generic drug: Guaifenesin Take 1,200 mg by mouth every 12 (twelve) hours as needed (to loosen phlegm or thin bronchial secretions).   ondansetron  4 MG tablet Commonly known as: ZOFRAN  Take 1 tablet (4 mg total) by mouth every 6 (six) hours as needed for nausea.   senna 8.6 MG Tabs tablet Commonly known as: SENOKOT Take 1 tablet (8.6 mg total) by mouth daily.   Skin Prep Wipes Misc Apply topically. Apply to Heels topically every day and evening shift for Skin care   Systane Ultra 0.4-0.3 % Soln Generic drug: Polyethyl Glycol-Propyl Glycol Place 1 drop into both eyes 4 (four) times daily as needed (for dryness).   zolpidem  5 MG tablet Commonly known as: AMBIEN  Take 1 tablet (5 mg total) by mouth at bedtime.        Review of Systems  Constitutional:  Negative for appetite change, fatigue and fever.  HENT:  Negative for congestion and trouble swallowing.   Eyes:  Negative for visual disturbance.  Respiratory:  Negative for cough and shortness of breath.   Cardiovascular:  Negative for leg swelling.  Gastrointestinal:  Negative for abdominal pain, constipation, nausea and vomiting.  Genitourinary:  Positive for frequency. Negative for dysuria and urgency.       Incontinent of urine.   Musculoskeletal:  Positive for arthralgias and gait problem.  Skin:  Negative for color change.  Neurological:  Negative for weakness and headaches.  Psychiatric/Behavioral:  Negative for behavioral problems, confusion and sleep disturbance. The patient is not nervous/anxious.     Immunization History  Administered Date(s) Administered   INFLUENZA, HIGH DOSE SEASONAL PF 09/05/2019   Influenza Split 11/02/2010, 08/12/2012, 09/03/2013, 09/24/2014, 09/16/2020, 09/16/2021   Influenza, Quadrivalent, Recombinant, Inj, Pf 09/11/2018   Influenza,inj,Quad PF,6+ Mos 09/24/2014    Influenza-Unspecified 08/24/2015, 09/20/2017, 11/11/2020   Moderna Sars-Covid-2 Vaccination 12/16/2019, 01/13/2020   Pneumococcal Conjugate-13 09/04/2017   Pneumococcal Polysaccharide-23 10/26/2007   Td (Adult),5 Lf Tetanus Toxid, Preservative Free 11/13/2012   Pertinent  Health Maintenance Due  Topic Date Due   DEXA SCAN  Never done   Influenza Vaccine  07/12/2024      01/05/2020   12:31 PM 04/17/2020    3:14 PM  Fall Risk  Falls in the past year?  0   Was there an injury with Fall?  0  Fall Risk Category Calculator  0  Fall Risk Category (Retired)  Low   (RETIRED) Patient Fall Risk Level Moderate fall risk  Low fall risk      Data saved with a previous flowsheet row definition   Functional Status Survey:    Vitals:   10/16/24 1419  BP: 124/68  Pulse: 84  Resp: 18  Temp: (!) 97.3 F (36.3 C)  SpO2: 93%  Weight: 105 lb 6.4 oz (47.8 kg)   Body mass index is 18.67 kg/m. Physical Exam Vitals and  nursing note reviewed.  Constitutional:      Appearance: Normal appearance.  HENT:     Head: Normocephalic and atraumatic.     Nose: Nose normal.     Mouth/Throat:     Mouth: Mucous membranes are moist.  Eyes:     Extraocular Movements: Extraocular movements intact.     Conjunctiva/sclera: Conjunctivae normal.     Pupils: Pupils are equal, round, and reactive to light.  Cardiovascular:     Rate and Rhythm: Normal rate and regular rhythm.     Heart sounds: No murmur heard. Pulmonary:     Effort: Pulmonary effort is normal.     Breath sounds: Wheezing and rales present. No rhonchi.     Comments: Posterior mid to lower left lung crackle, scattered expiratory wheezes on deep breathing.  Abdominal:     General: Bowel sounds are normal.     Palpations: Abdomen is soft.     Tenderness: There is no abdominal tenderness.  Musculoskeletal:        General: No tenderness. Normal range of motion.     Cervical back: Normal range of motion and neck supple.     Right lower leg: No  edema.     Left lower leg: No edema.  Skin:    General: Skin is warm and dry.     Findings: No rash.  Neurological:     General: No focal deficit present.     Mental Status: She is alert and oriented to person, place, and time. Mental status is at baseline.     Motor: No weakness.     Coordination: Coordination normal.     Gait: Gait abnormal.  Psychiatric:        Mood and Affect: Mood normal.        Behavior: Behavior normal.        Thought Content: Thought content normal.        Judgment: Judgment normal.     Labs reviewed: Recent Labs    10/10/24 0742 10/11/24 0344 10/12/24 0654 10/13/24 0631  NA 139 141 140 138  K 3.3* 3.6 3.4* 3.7  CL 105 106 105 104  CO2 20* 24 23 23   GLUCOSE 117* 133* 116* 103*  BUN 10 9 10 11   CREATININE 0.78 0.72 0.71 0.66  CALCIUM 9.0 8.9 8.8* 9.0  MG 2.0 1.7 1.7  --    Recent Labs    10/11/24 0344 10/12/24 0654 10/13/24 0631  AST 92* 53* 53*  ALT 73* 59* 58*  ALKPHOS 123 101 100  BILITOT 0.5 0.5 0.5  PROT 5.9* 5.8* 6.3*  ALBUMIN 2.9* 2.7* 3.0*   Recent Labs    10/08/24 1635 10/09/24 0329 10/11/24 0344 10/12/24 0654 10/13/24 0631  WBC 21.7*   < > 14.0* 13.4* 13.8*  NEUTROABS 19.0*  --   --   --   --   HGB 11.3*   < > 10.3* 10.3* 11.1*  HCT 35.3*   < > 32.5* 31.3* 38.1  MCV 101.4*   < > 100.9* 102.0* 112.1*  PLT 291   < > 329 444* 408*   < > = values in this interval not displayed.   Lab Results  Component Value Date   TSH 2.130 10/08/2024   No results found for: HGBA1C Lab Results  Component Value Date   CHOL 248 (H) 03/04/2020   HDL 87 03/04/2020   LDLCALC 139 (H) 03/04/2020   TRIG 102 03/04/2020   CHOLHDL 2.9 03/04/2020    Significant  Diagnostic Results in last 30 days:    Assessment/Plan Acute pulmonary embolism (HCC) PE, CTA of the chest showed small nonocclusive PE with right ventricular strain, Doppler showed  the left lower extremity small saphenous superficial venous thrombus.  Placed on Eliquis No  chest pain or SOB  Sepsis due to pneumonia [facility]) fully treated with 7-day course of Augmentin and doxycycline , wbc 13.8 10/13/24 Update CBC   CKD (chronic kidney disease) stage 3, GFR 30-59 ml/min (HCC) AKI on CKD, Bun/creat 11/0.66 10/13/24 Repeat CMP  Transaminitis  AST 53, ALT 58 trending down Repeat CMP  Hyponatremia Na 138 10/13/24  Dilation of pancreatic duct Dilation of the pancreatic duct, GI as outpatient, CT abdomen/pelvis showed bile duct dilation 10 mm a small pancreatic duct dilation, previous provider has spoken with the patient/family for MRCP  Joint pain of left hip on movement OA left hip, resulted of fall at home, CT scan ruled out fracture or dislocation, Norco prn  Hyperlipidemia LDL 139 03/04/20 Obtain lipid panel  Generalized anxiety disorder GAD/insomnia, takes his Celexa , Ambien   Colitis Mild chronic ulcerative colitis, transverse colon and splenic flexure, treated with antibiotics, negative C. difficile and GI panel.  Improved  Thyroid  nodule Thyroid  nodule, normal TSH and free T4, CT cervical spine showed 2.2 cm right thyroid  solid nodule, recommend ultrasound. TSH 2.13 10/08/24 Thyroid  US   Slow transit constipation  taking Senna  Insomnia The patient demanded home med: Doxylamine 25mg  po hs prn in addition to Ambien  for sleep. The patient is aware of R vs B of Doxylamine/Ambien . Observe.     Family/ staff Communication: Plan of care reviewed with patient and charge nurse  Labs/tests ordered:  CBC, lipid panel,  CMP 10/17/24, schedule thyroid  US 

## 2024-10-16 NOTE — Assessment & Plan Note (Signed)
 fully treated with 7-day course of Augmentin and doxycycline , wbc 13.8 10/13/24 Update CBC

## 2024-10-16 NOTE — Assessment & Plan Note (Signed)
 Na 138 10/13/24

## 2024-10-16 NOTE — Assessment & Plan Note (Signed)
 PE, CTA of the chest showed small nonocclusive PE with right ventricular strain, Doppler showed  the left lower extremity small saphenous superficial venous thrombus.  Placed on Eliquis No chest pain or SOB

## 2024-10-16 NOTE — Assessment & Plan Note (Signed)
 AST 53, ALT 58 trending down Repeat CMP

## 2024-10-16 NOTE — Assessment & Plan Note (Signed)
 taking Senna

## 2024-10-16 NOTE — Assessment & Plan Note (Signed)
 OA left hip, resulted of fall at home, CT scan ruled out fracture or dislocation, Norco prn

## 2024-10-16 NOTE — Assessment & Plan Note (Signed)
 Thyroid  nodule, normal TSH and free T4, CT cervical spine showed 2.2 cm right thyroid  solid nodule, recommend ultrasound. TSH 2.13 10/08/24 Thyroid  US 

## 2024-10-16 NOTE — Assessment & Plan Note (Signed)
 Dilation of the pancreatic duct, GI as outpatient, CT abdomen/pelvis showed bile duct dilation 10 mm a small pancreatic duct dilation, previous provider has spoken with the patient/family for MRCP

## 2024-10-16 NOTE — Assessment & Plan Note (Signed)
 Mild chronic ulcerative colitis, transverse colon and splenic flexure, treated with antibiotics, negative C. difficile and GI panel.  Improved

## 2024-10-16 NOTE — Assessment & Plan Note (Signed)
 LDL 139 03/04/20 Obtain lipid panel

## 2024-10-16 NOTE — Assessment & Plan Note (Signed)
 AKI on CKD, Bun/creat 11/0.66 10/13/24 Repeat CMP

## 2024-10-17 ENCOUNTER — Non-Acute Institutional Stay: Payer: Self-pay | Admitting: Internal Medicine

## 2024-10-17 ENCOUNTER — Other Ambulatory Visit: Payer: Self-pay | Admitting: Nurse Practitioner

## 2024-10-17 DIAGNOSIS — I10 Essential (primary) hypertension: Secondary | ICD-10-CM | POA: Diagnosis not present

## 2024-10-17 DIAGNOSIS — E041 Nontoxic single thyroid nodule: Secondary | ICD-10-CM | POA: Diagnosis not present

## 2024-10-17 DIAGNOSIS — Z9181 History of falling: Secondary | ICD-10-CM | POA: Diagnosis not present

## 2024-10-17 DIAGNOSIS — F411 Generalized anxiety disorder: Secondary | ICD-10-CM | POA: Diagnosis not present

## 2024-10-17 DIAGNOSIS — M25552 Pain in left hip: Secondary | ICD-10-CM | POA: Diagnosis not present

## 2024-10-17 DIAGNOSIS — R7401 Elevation of levels of liver transaminase levels: Secondary | ICD-10-CM

## 2024-10-17 DIAGNOSIS — I2699 Other pulmonary embolism without acute cor pulmonale: Secondary | ICD-10-CM

## 2024-10-17 DIAGNOSIS — J189 Pneumonia, unspecified organism: Secondary | ICD-10-CM | POA: Diagnosis not present

## 2024-10-17 DIAGNOSIS — M6281 Muscle weakness (generalized): Secondary | ICD-10-CM | POA: Diagnosis not present

## 2024-10-17 DIAGNOSIS — A419 Sepsis, unspecified organism: Secondary | ICD-10-CM

## 2024-10-17 DIAGNOSIS — G4700 Insomnia, unspecified: Secondary | ICD-10-CM

## 2024-10-17 DIAGNOSIS — K529 Noninfective gastroenteritis and colitis, unspecified: Secondary | ICD-10-CM | POA: Diagnosis not present

## 2024-10-17 DIAGNOSIS — R2681 Unsteadiness on feet: Secondary | ICD-10-CM | POA: Diagnosis not present

## 2024-10-17 DIAGNOSIS — E119 Type 2 diabetes mellitus without complications: Secondary | ICD-10-CM | POA: Diagnosis not present

## 2024-10-17 MED ORDER — DOXYLAMINE SUCCINATE (SLEEP) 25 MG PO TABS: 25.0000 mg | ORAL_TABLET | Freq: Every evening | ORAL | Status: AC | PRN

## 2024-10-17 NOTE — Assessment & Plan Note (Signed)
 The patient demanded home med: Doxylamine 25mg  po hs prn in addition to Ambien  for sleep. The patient is aware of R vs B of Doxylamine/Ambien . Observe.

## 2024-10-18 ENCOUNTER — Encounter: Payer: Self-pay | Admitting: Internal Medicine

## 2024-10-18 DIAGNOSIS — M25552 Pain in left hip: Secondary | ICD-10-CM | POA: Diagnosis not present

## 2024-10-18 DIAGNOSIS — J189 Pneumonia, unspecified organism: Secondary | ICD-10-CM | POA: Diagnosis not present

## 2024-10-18 DIAGNOSIS — R2681 Unsteadiness on feet: Secondary | ICD-10-CM | POA: Diagnosis not present

## 2024-10-18 DIAGNOSIS — M6281 Muscle weakness (generalized): Secondary | ICD-10-CM | POA: Diagnosis not present

## 2024-10-18 DIAGNOSIS — Z9181 History of falling: Secondary | ICD-10-CM | POA: Diagnosis not present

## 2024-10-18 MED ORDER — ZOLPIDEM TARTRATE 5 MG PO TABS: 5.0000 mg | ORAL_TABLET | Freq: Every evening | ORAL | Status: DC | PRN

## 2024-10-18 NOTE — Progress Notes (Signed)
 Provider:   Location:  [facility] Curator of Service:  SNF ([id number])  PCP: Nichole Senior, MD Patient Care Team: Nichole Senior, MD as PCP - General (Endocrinology) Mindi Mt, MD (Inactive) as Consulting Physician (Anesthesiology) Devere Lonni Righter, MD as Consulting Physician (Urology)  Extended Emergency Contact Information Primary Emergency Contact: [facility] Phone: [phone] Mobile Phone: [phone] Relation: Daughter Secondary Emergency Contact: Altamease Willy MADRID [place] Home Phone: [phone] Mobile Phone: [phone] Relation: Daughter  Code Status: DNR Goals of Care: Advanced Directive information    10/14/2024    7:29 AM  Advanced Directives  Would patient like information on creating a medical advance directive? No - Patient declined      Chief Complaint  Patient presents with   New Admit To SNF    HPI: Patient is a 89 y.o. female seen today for admission to SNF  Patient lives by herself in [facility] [place] She she is independent in her ADLs at home.  Gets help from her daughter  She has a history of CKD stage III, HLD and anxiety. She was admitted in the hospital from 10/28 to 11/4 for pneumonia, sepsis, acute PE and colitis  Patient does not remember how she ended up in the hospital.  But per her daughter patient was having confusion.  Was seen by her PCP  office and was told that she has pneumonia.  She refused to go to the hospital but eventually agreed.   In the hospital the CT scan showed Single small non-occlusive pulmonary embolism within the right lower lobe lobar branch. Also showed multifocal pneumonia Patient also had a CT of the abdomen done for abdominal pain and it showed mild colitis in the colon Also CBD measuring up to 10 mm  CT of the neck showed 2.2 cm thyroid  nodule CT of the head showed moderate atrophy Dopplers of the legs showed chronic superficial vein thrombosis in left  small saphenous vein CT of the left hip did not show any fractures  For pneumonia patient was treated for 7-day course of Augmentin and doxycycline  She was also started on Eliquis for her PE  Patient is now in rehab.  She is already walking with her walker and doing some of her ADLs.  Denies any cough shortness of breath.  Does have mild cognitive impairment.  She wants to go back to her apartment No abdominal pain  Past Medical History:  Diagnosis Date   Arthritis    Back pain    Chronic kidney disease, stage 3a (HCC)    Insomnia    Leg pain    Neck pain    No past surgical history on file.  reports that she has never smoked. She has never used smokeless tobacco. She reports current alcohol  use of about 14.0 standard drinks of alcohol  per week. She reports that she does not use drugs. Social History   Socioeconomic History   Marital status: Widowed    Spouse name: Not on file   Number of children: Not on file   Years of education: Not on file   Highest education level: Not on file  Occupational History   Not on file  Tobacco Use   Smoking status: Never   Smokeless tobacco: Never  Vaping Use   Vaping status: Never Used  Substance and Sexual Activity   Alcohol  use: Yes    Alcohol /week: 14.0 standard drinks of alcohol     Types: 14  Standard drinks or equivalent per week   Drug use: Never   Sexual activity: Not on file  Other Topics Concern   Not on file  Social History Narrative   Social History      Diet? light      Do you drink/eat things with caffeine? yes      Marital status?               Widowed                      What year were you married? 1946      Do you live in a house, apartment, assisted living, condo, trailer, etc.? Apartment retirement community       Is it one or more stories? yes      How many persons live in your home? 1      Do you have any pets in your home? (please list) no       Highest level of education completed? 2 years college        Current or past profession:      Do you exercise?         yes                             Type & how often? Water aerobics       Advanced Directives      Do you have a living will? yes      Do you have a DNR form?                  yes                If not, do you want to discuss one? yes      Do you have signed POA/HPOA for forms? yes      Functional Status      Do you have difficulty bathing or dressing yourself? No       Do you have difficulty preparing food or eating? No       Do you have difficulty managing your medications? No       Do you have difficulty managing your finances? No       Do you have difficulty affording your medications? No       Social Drivers of Corporate Investment Banker Strain: Not on file  Food Insecurity: No Food Insecurity (10/09/2024)   Hunger Vital Sign    Worried About Running Out of Food in the Last Year: Never true    Ran Out of Food in the Last Year: Never true  Transportation Needs: No Transportation Needs (10/09/2024)   PRAPARE - Administrator, Civil Service (Medical): No    Lack of Transportation (Non-Medical): No  Physical Activity: Not on file  Stress: Not on file  Social Connections: Moderately Isolated (10/12/2024)   Social Connection and Isolation Panel    Frequency of Communication with Friends and Family: Three times a week    Frequency of Social Gatherings with Friends and Family: Three times a week    Attends Religious Services: Never    Active Member of Clubs or Organizations: Yes    Attends Banker Meetings: 1 to 4 times per year    Marital Status: Widowed  Intimate Partner Violence: Not At Risk (10/09/2024)   Humiliation, Afraid, Rape, and Kick questionnaire  Fear of Current or Ex-Partner: No    Emotionally Abused: No    Physically Abused: No    Sexually Abused: No    Functional Status Survey:    Family History  Problem Relation Age of Onset   Cancer Father    Cancer Sister     Stroke Sister     Health Maintenance  Topic Date Due   Medicare Annual Wellness (AWV)  Never done   Zoster Vaccines- Shingrix  (1 of 2) Never done   DEXA SCAN  Never done   DTaP/Tdap/Td (1 - Tdap) 11/14/2012   Influenza Vaccine  07/12/2024   COVID-19 Vaccine (4 - 2025-26 season) 08/12/2024   Pneumococcal Vaccine: 50+ Years  Completed   Meningococcal B Vaccine  Aged Out    No Known Allergies  Outpatient Encounter Medications as of 10/17/2024  Medication Sig   acetaminophen  (TYLENOL ) 325 MG tablet Take 2 tablets (650 mg total) by mouth every 6 (six) hours as needed for mild pain (pain score 1-3) or headache (or Fever >/= 101).   albuterol (PROVENTIL) (2.5 MG/3ML) 0.083% nebulizer solution Take 3 mLs (2.5 mg total) by nebulization every 6 (six) hours as needed for wheezing or shortness of breath.   apixaban (ELIQUIS) 5 MG TABS tablet Take 2 tablets (10 mg total) by mouth 2 (two) times daily for 3 days, THEN 1 tablet (5 mg total) 2 (two) times daily.   AZO CRANBERRY GUMMIES PO Take 2 Pieces by mouth See admin instructions. Chew 2 gummies by mouth in the morning   calcium carbonate (OSCAL) 1500 (600 Ca) MG TABS tablet Take 1,500 mg by mouth daily with breakfast.   citalopram  (CELEXA ) 20 MG tablet Take 20 mg by mouth daily.   doxylamine, Sleep, (UNISOM) 25 MG tablet Take 1 tablet (25 mg total) by mouth at bedtime as needed.   feeding supplement (ENSURE PLUS HIGH PROTEIN) LIQD Take 237 mLs by mouth daily at 2 PM.   Guaifenesin (MUCINEX MAXIMUM STRENGTH) 1200 MG TB12 Take 1,200 mg by mouth every 12 (twelve) hours as needed (to loosen phlegm or thin bronchial secretions).   HYDROcodone -acetaminophen  (NORCO/VICODIN) 5-325 MG tablet Take 1 tablet by mouth every 8 (eight) hours as needed for moderate pain (pain score 4-6).   latanoprost  (XALATAN ) 0.005 % ophthalmic solution Place 1 drop into both eyes at bedtime.   ondansetron  (ZOFRAN ) 4 MG tablet Take 1 tablet (4 mg total) by mouth every 6 (six)  hours as needed for nausea.   Ostomy Supplies (SKIN PREP WIPES) MISC Apply topically. Apply to Heels topically every day and evening shift for Skin care   senna (SENOKOT) 8.6 MG TABS tablet Take 1 tablet (8.6 mg total) by mouth daily.   SYSTANE ULTRA 0.4-0.3 % SOLN Place 1 drop into both eyes 4 (four) times daily as needed (for dryness).   zolpidem  (AMBIEN ) 5 MG tablet Take 1 tablet (5 mg total) by mouth at bedtime.   No facility-administered encounter medications on file as of 10/17/2024.    Review of Systems  Constitutional:  Negative for activity change and appetite change.  HENT: Negative.    Respiratory:  Negative for cough and shortness of breath.   Cardiovascular:  Negative for leg swelling.  Gastrointestinal:  Negative for constipation.  Genitourinary: Negative.   Musculoskeletal:  Positive for gait problem. Negative for arthralgias and myalgias.  Skin: Negative.   Neurological:  Negative for dizziness and weakness.  Psychiatric/Behavioral:  Positive for confusion. Negative for dysphoric mood and sleep disturbance.  Vitals:   10/17/24 0554  BP: 110/65  Pulse: 69  Resp: 18  Temp: (!) 97.5 F (36.4 C)  Weight: 105 lb (47.6 kg)   Body mass index is 18.6 kg/m. Physical Exam Vitals reviewed.  Constitutional:      Appearance: Normal appearance.  HENT:     Head: Normocephalic.     Nose: Nose normal.     Mouth/Throat:     Mouth: Mucous membranes are moist.     Pharynx: Oropharynx is clear.  Eyes:     Pupils: Pupils are equal, round, and reactive to light.  Cardiovascular:     Rate and Rhythm: Normal rate and regular rhythm.     Pulses: Normal pulses.     Heart sounds: Normal heart sounds. No murmur heard. Pulmonary:     Effort: Pulmonary effort is normal.     Breath sounds: Normal breath sounds.  Abdominal:     General: Abdomen is flat. Bowel sounds are normal.     Palpations: Abdomen is soft.  Musculoskeletal:        General: No swelling.     Cervical  back: Neck supple.  Skin:    General: Skin is warm.  Neurological:     General: No focal deficit present.     Mental Status: She is alert and oriented to person, place, and time.  Psychiatric:        Mood and Affect: Mood normal.        Thought Content: Thought content normal.     Labs reviewed: Basic Metabolic Panel: Recent Labs    10/10/24 0742 10/11/24 0344 10/12/24 0654 10/13/24 0631  NA 139 141 140 138  K 3.3* 3.6 3.4* 3.7  CL 105 106 105 104  CO2 20* 24 23 23   GLUCOSE 117* 133* 116* 103*  BUN 10 9 10 11   CREATININE 0.78 0.72 0.71 0.66  CALCIUM 9.0 8.9 8.8* 9.0  MG 2.0 1.7 1.7  --    Liver Function Tests: Recent Labs    10/11/24 0344 10/12/24 0654 10/13/24 0631  AST 92* 53* 53*  ALT 73* 59* 58*  ALKPHOS 123 101 100  BILITOT 0.5 0.5 0.5  PROT 5.9* 5.8* 6.3*  ALBUMIN 2.9* 2.7* 3.0*   Recent Labs    10/08/24 1635  LIPASE 16   No results for input(s): AMMONIA in the last 8760 hours. CBC: Recent Labs    10/08/24 1635 10/09/24 0329 10/11/24 0344 10/12/24 0654 10/13/24 0631  WBC 21.7*   < > 14.0* 13.4* 13.8*  NEUTROABS 19.0*  --   --   --   --   HGB 11.3*   < > 10.3* 10.3* 11.1*  HCT 35.3*   < > 32.5* 31.3* 38.1  MCV 101.4*   < > 100.9* 102.0* 112.1*  PLT 291   < > 329 444* 408*   < > = values in this interval not displayed.   Cardiac Enzymes: No results for input(s): CKTOTAL, CKMB, CKMBINDEX, TROPONINI in the last 8760 hours. BNP: Invalid input(s): POCBNP No results found for: HGBA1C Lab Results  Component Value Date   TSH 2.130 10/08/2024   Lab Results  Component Value Date   VITAMINB12 391 03/04/2020   No results found for: FOLATE No results found for: IRON, TIBC, FERRITIN  Imaging and Procedures obtained prior to SNF admission: VAS US  LOWER EXTREMITY VENOUS (DVT) Result Date: 10/10/2024  Lower Venous DVT Study Patient Name:  Wrenn D Clanton  Date of Exam:   10/09/2024 Medical Rec #:  [id number]      Accession #:     [id number] Date of Birth: 1924-01-13     Patient Gender: F Patient Age:   89 years Exam Location:  [facility] Procedure:      VAS US  LOWER EXTREMITY VENOUS (DVT) Referring Phys: MICAELA SUNDIL --------------------------------------------------------------------------------  Indications: Swelling, Edema, and pulmonary embolism.  Comparison Study: Previous study of the left lower extremity on 2.5.2021. Performing Technologist: Edilia Elden Appl  Examination Guidelines: A complete evaluation includes B-mode imaging, spectral Doppler, color Doppler, and power Doppler as needed of all accessible portions of each vessel. Bilateral testing is considered an integral part of a complete examination. Limited examinations for reoccurring indications may be performed as noted. The reflux portion of the exam is performed with the patient in reverse Trendelenburg.  +---------+---------------+---------+-----------+----------+--------------+ RIGHT    CompressibilityPhasicitySpontaneityPropertiesThrombus Aging +---------+---------------+---------+-----------+----------+--------------+ CFV      Full           Yes      Yes                                 +---------+---------------+---------+-----------+----------+--------------+ SFJ      Full           Yes      Yes                                 +---------+---------------+---------+-----------+----------+--------------+ FV Prox  Full                                                        +---------+---------------+---------+-----------+----------+--------------+ FV Mid   Full                                                        +---------+---------------+---------+-----------+----------+--------------+ FV DistalFull           Yes      Yes                                 +---------+---------------+---------+-----------+----------+--------------+ PFV      Full                                                         +---------+---------------+---------+-----------+----------+--------------+ POP      Full           No       Yes                  Rouleaux flow. +---------+---------------+---------+-----------+----------+--------------+ PTV      Full                                                        +---------+---------------+---------+-----------+----------+--------------+  PERO     Full                                                        +---------+---------------+---------+-----------+----------+--------------+   +---------+---------------+---------+-----------+----------+--------------+ LEFT     CompressibilityPhasicitySpontaneityPropertiesThrombus Aging +---------+---------------+---------+-----------+----------+--------------+ CFV      Full           Yes      Yes                                 +---------+---------------+---------+-----------+----------+--------------+ SFJ      Full           Yes      Yes                                 +---------+---------------+---------+-----------+----------+--------------+ FV Prox  Full                                                        +---------+---------------+---------+-----------+----------+--------------+ FV Mid   Full                                                        +---------+---------------+---------+-----------+----------+--------------+ FV DistalFull                                                        +---------+---------------+---------+-----------+----------+--------------+ PFV      Full                                                        +---------+---------------+---------+-----------+----------+--------------+ POP      Full           Yes      Yes                                 +---------+---------------+---------+-----------+----------+--------------+ PTV      Full                                                         +---------+---------------+---------+-----------+----------+--------------+ PERO     Full                                                        +---------+---------------+---------+-----------+----------+--------------+  SSV      None           No       No                   Chronic        +---------+---------------+---------+-----------+----------+--------------+ Chronic calcified deep vein thrombosis noted in the left small saphenous vein at the proximal segment.    Summary: RIGHT: - There is no evidence of deep vein thrombosis in the lower extremity.  - No cystic structure found in the popliteal fossa.  LEFT: - Findings consistent with chronic superficial vein thrombosis involving the left small saphenous vein.  - No cystic structure found in the popliteal fossa.  *See table(s) above for measurements and observations. Electronically signed by Debby Robertson on 10/10/2024 at 8:01:23 PM.    Final    ECHOCARDIOGRAM COMPLETE Result Date: 10/09/2024    ECHOCARDIOGRAM REPORT   Patient Name:   Jenevieve D Hoshino Date of Exam: 10/09/2024 Medical Rec #:  [id number]     Height:       63.0 in Accession #:    [id number]    Weight:       107.0 lb Date of Birth:  September 07, 1924    BSA:          1.482 m Patient Age:    89 years      BP:           122/47 mmHg Patient Gender: F             HR:           109 bpm. Exam Location:  Inpatient Procedure: 2D Echo, Color Doppler and Cardiac Doppler (Both Spectral and Color            Flow Doppler were utilized during procedure). Indications:    Pulmonary Embolism I26.9  History:        Patient has no prior history of Echocardiogram examinations.                 Signs/Symptoms:Shortness of Breath. H/O Hyperlipidemia.  Sonographer:    BERNARDA ROCKS Referring Phys: [id number] SUBRINA SUNDIL IMPRESSIONS  1. Left ventricular ejection fraction, by estimation, is 55 to 60%. Left ventricular ejection fraction by 2D MOD biplane is 59.7 %. The left ventricle has normal function. The left  ventricle has no regional wall motion abnormalities. Left ventricular diastolic parameters are consistent with Grade I diastolic dysfunction (impaired relaxation). Elevated left ventricular end-diastolic pressure.  2. Right ventricular systolic function is normal. The right ventricular size is normal. There is mildly elevated pulmonary artery systolic pressure. The estimated right ventricular systolic pressure is 40.5 mmHg.  3. The mitral valve is grossly normal. Trivial mitral valve regurgitation.  4. The aortic valve is tricuspid. Aortic valve regurgitation is not visualized. Aortic valve sclerosis/calcification is present, without any evidence of aortic stenosis. Aortic valve mean gradient measures 8.0 mmHg.  5. The inferior vena cava is normal in size with greater than 50% respiratory variability, suggesting right atrial pressure of 3 mmHg. Comparison(s): No prior Echocardiogram. FINDINGS  Left Ventricle: Left ventricular ejection fraction, by estimation, is 55 to 60%. Left ventricular ejection fraction by 2D MOD biplane is 59.7 %. The left ventricle has normal function. The left ventricle has no regional wall motion abnormalities. The left ventricular internal cavity size was normal in size. There is no left ventricular hypertrophy. Left ventricular diastolic parameters are consistent with Grade I diastolic dysfunction (impaired relaxation). Elevated left ventricular  end-diastolic pressure. Right Ventricle: The right ventricular size is normal. No increase in right ventricular wall thickness. Right ventricular systolic function is normal. There is mildly elevated pulmonary artery systolic pressure. The tricuspid regurgitant velocity is 3.06  m/s, and with an assumed right atrial pressure of 3 mmHg, the estimated right ventricular systolic pressure is 40.5 mmHg. Left Atrium: Left atrial size was normal in size. Right Atrium: Right atrial size was normal in size. Pericardium: There is no evidence of pericardial  effusion. Mitral Valve: The mitral valve is grossly normal. Trivial mitral valve regurgitation. MV peak gradient, 8.2 mmHg. The mean mitral valve gradient is 3.0 mmHg. Tricuspid Valve: The tricuspid valve is grossly normal. Tricuspid valve regurgitation is mild. Aortic Valve: The aortic valve is tricuspid. Aortic valve regurgitation is not visualized. Aortic valve sclerosis/calcification is present, without any evidence of aortic stenosis. Aortic valve mean gradient measures 8.0 mmHg. Aortic valve peak gradient measures 17.2 mmHg. Aortic valve area, by VTI measures 1.46 cm. Pulmonic Valve: The pulmonic valve was normal in structure. Pulmonic valve regurgitation is not visualized. Aorta: The aortic root and ascending aorta are structurally normal, with no evidence of dilitation. Venous: The inferior vena cava is normal in size with greater than 50% respiratory variability, suggesting right atrial pressure of 3 mmHg. IAS/Shunts: No atrial level shunt detected by color flow Doppler.  LEFT VENTRICLE PLAX 2D                        Biplane EF (MOD) LVIDd:         4.10 cm         LV Biplane EF:   Left LVIDs:         2.60 cm                          ventricular LV PW:         0.90 cm                          ejection LV IVS:        0.90 cm                          fraction by LVOT diam:     2.10 cm                          2D MOD LV SV:         57                               biplane is LV SV Index:   39                               59.7 %. LVOT Area:     3.46 cm                                Diastology                                LV e' medial:    6.53 cm/s LV Volumes (MOD)  LV E/e' medial:  18.4 LV vol d, MOD    92.5 ml       LV e' lateral:   7.72 cm/s A2C:                           LV E/e' lateral: 15.5 LV vol d, MOD    73.9 ml A4C: LV vol s, MOD    36.7 ml A2C: LV vol s, MOD    28.4 ml A4C: LV SV MOD A2C:   55.8 ml LV SV MOD A4C:   73.9 ml LV SV MOD BP:    51.2 ml RIGHT VENTRICLE             IVC RV  Basal diam:  2.70 cm     IVC diam: 1.70 cm RV S prime:     14.90 cm/s TAPSE (M-mode): 2.2 cm RVSP:           40.5 mmHg LEFT ATRIUM             Index        RIGHT ATRIUM           Index LA diam:        3.30 cm 2.23 cm/m   RA Pressure: 3.00 mmHg LA Vol (A2C):   51.8 ml 34.95 ml/m  RA Area:     11.40 cm LA Vol (A4C):   46.1 ml 31.10 ml/m  RA Volume:   21.60 ml  14.57 ml/m LA Biplane Vol: 49.2 ml 33.19 ml/m  AORTIC VALVE                     PULMONIC VALVE AV Area (Vmax):    1.79 cm      PV Vmax:       0.87 m/s AV Area (Vmean):   1.53 cm      PV Peak grad:  3.0 mmHg AV Area (VTI):     1.46 cm AV Vmax:           207.50 cm/s AV Vmean:          128.500 cm/s AV VTI:            0.395 m AV Peak Grad:      17.2 mmHg AV Mean Grad:      8.0 mmHg LVOT Vmax:         107.00 cm/s LVOT Vmean:        56.800 cm/s LVOT VTI:          0.166 m LVOT/AV VTI ratio: 0.42  AORTA Ao Root diam: 2.85 cm Ao Asc diam:  2.90 cm MITRAL VALVE                TRICUSPID VALVE MV Area (PHT): 5.70 cm     TR Peak grad:   37.5 mmHg MV Area VTI:   2.10 cm     TR Vmax:        306.00 cm/s MV Peak grad:  8.2 mmHg     Estimated RAP:  3.00 mmHg MV Mean grad:  3.0 mmHg     RVSP:           40.5 mmHg MV Vmax:       1.43 m/s MV Vmean:      78.8 cm/s    SHUNTS MV Decel Time: 133 msec     Systemic VTI:  0.17 m MV E velocity: 120.00 cm/s  Systemic Diam: 2.10 cm MV  A velocity: 141.00 cm/s MV E/A ratio:  0.85 Vinie Maxcy MD Electronically signed by Vinie Maxcy MD Signature Date/Time: 10/09/2024/1:54:30 PM    Final    CT Head Wo Contrast Result Date: 10/08/2024 EXAM: CT HEAD AND CERVICAL SPINE 10/08/2024 08:19:30 PM TECHNIQUE: CT of the head and cervical spine was performed without the administration of intravenous contrast. Multiplanar reformatted images are provided for review. Automated exposure control, iterative reconstruction, and/or weight-based adjustment of the mA/kV was utilized to reduce the radiation dose to as low as reasonably achievable.  COMPARISON: Comparison is made with cervical spine MRI of 03/24/20. No prior cross-sectional imaging of the brain. CLINICAL HISTORY: Polytrauma, blunt. Patient was sent to ED by Dr. Nichole (PCP) for pneumonia and UTI. Clemens a few nights ago and increased confusion. She lives at [facility] and was seen by staff night of fall. Reports some soreness across hips and shoulders from. Denies LOC; and was able to scoot on rear to call for help. FINDINGS: CT HEAD BRAIN AND VENTRICLES: Mild to moderate cerebrocerebellar atrophy, atrophic ventriculomegaly, and small vessel disease of the cerebral white matter are seen. There are a few small chronic bilateral gangliocapsular lacunar infarcts, and senescent mineralization in both basal ganglia. No acute intracranial hemorrhage. No mass effect or midline shift. No abnormal extra-axial fluid collection. No evidence of acute infarct. No hydrocephalus. ORBITS: No acute abnormality. SINUSES AND MASTOIDS: Mild mucosal thickening of the paranasal sinuses without fluid levels. No mastoid effusion is seen. SOFT TISSUES AND SKULL: No depressed skull fractures. No focal skull lesions. No visible scalp hematoma. . Benign dural calcifications are scattered along the falx and tentorium. Heavy calcification of the carotid siphons is seen. No hyperdense vessels. Osteopenia is present without evidence of fracture or focal pathologic bone lesion. CT CERVICAL SPINE BONES AND ALIGNMENT: Minimal 1 to 2 mm grade 1 degenerative anterior subluxation of C2-3 and C3-4, and 3 to 4 mm grade 1 anterolisthesis of C7 upon C1, also degenerative, all stable. No new alignment abnormality or traumatic malalignment is seen. Bone on bone anterior atlanto-dental joint space loss and bone on bone C1 C2 lateral mass articulation joint loss, with asymmetric bulky hypertrophic arthropathy of the left lateral mass articulation are also noted. DEGENERATIVE CHANGES: Degenerative subcortical cystic change and sclerosis  of the dens, chronic discogenic endplate sclerosis opposite C6-C7, and ankylosed hypertrophic facet arthropathy bilaterally at C2-C4 and on the right at C5-6. SABRA The cervical discs are diffusely degenerated except for C2-C3, which is normal in height. The other discs are collapsed with varying degrees of partial interbody ankylosis. Bidirectional endplate osteophytes are seen from C2-3 through C7-T1, with mild effacement of the ventral cord surface secondary 2 posterior osteophytes at C4-5, C5-6, and C6-7, and additional hypertrophic dorsal ligamentous ossification present C6-C7. No levels show frank cord compression. Diffuse hypertrophic facet arthropathy, multilevel uncinate osteophytes, and multilevel severe acquired foraminal stenosis are seen, similar to the MRI in 2021. SOFT TISSUES: No prevertebral soft tissue swelling. VASCULATURE: Calcific plaques are seen at both carotid bifurcations. LUNGS: Asymmetric left greater than right pleuroparenchymal scarlike opacity is seen in the lung apices. THYROID : A 2.2 cm hypodense solid nodule is seen in the right lobe of the thyroid  gland. Further evaluation should only be considered taking into account advanced age and life expectancy. IMPRESSION: 1. No acute intracranial CT findings or depressed skull fractures. Chronic changes. 2. No acute fracture or traumatic malalignment of the cervical spine. 3. Multilevel severe cervical foraminal stenosis, and cervical spondylosis without cord  compression, similar to prior MRI in 2021. 4. Right thyroid  lobe solid nodule measuring 2.2 cm; ordinarily would recommend dedicated thyroid  ultrasound for characterization. In this case, further evaluation should take into account advanced age and life expectancy. 5. Carotid atherosclerosis. Electronically signed by: Francis Quam MD 10/08/2024 08:51 PM EDT RP Workstation: [id number]   CT Cervical Spine Wo Contrast Result Date: 10/08/2024 EXAM: CT HEAD AND CERVICAL SPINE 10/08/2024  08:19:30 PM TECHNIQUE: CT of the head and cervical spine was performed without the administration of intravenous contrast. Multiplanar reformatted images are provided for review. Automated exposure control, iterative reconstruction, and/or weight-based adjustment of the mA/kV was utilized to reduce the radiation dose to as low as reasonably achievable. COMPARISON: Comparison is made with cervical spine MRI of 03/24/20. No prior cross-sectional imaging of the brain. CLINICAL HISTORY: Polytrauma, blunt. Patient was sent to ED by Dr. Nichole (PCP) for pneumonia and UTI. Clemens a few nights ago and increased confusion. She lives at [facility] and was seen by staff night of fall. Reports some soreness across hips and shoulders from. Denies LOC; and was able to scoot on rear to call for help. FINDINGS: CT HEAD BRAIN AND VENTRICLES: Mild to moderate cerebrocerebellar atrophy, atrophic ventriculomegaly, and small vessel disease of the cerebral white matter are seen. There are a few small chronic bilateral gangliocapsular lacunar infarcts, and senescent mineralization in both basal ganglia. No acute intracranial hemorrhage. No mass effect or midline shift. No abnormal extra-axial fluid collection. No evidence of acute infarct. No hydrocephalus. ORBITS: No acute abnormality. SINUSES AND MASTOIDS: Mild mucosal thickening of the paranasal sinuses without fluid levels. No mastoid effusion is seen. SOFT TISSUES AND SKULL: No depressed skull fractures. No focal skull lesions. No visible scalp hematoma. . Benign dural calcifications are scattered along the falx and tentorium. Heavy calcification of the carotid siphons is seen. No hyperdense vessels. Osteopenia is present without evidence of fracture or focal pathologic bone lesion. CT CERVICAL SPINE BONES AND ALIGNMENT: Minimal 1 to 2 mm grade 1 degenerative anterior subluxation of C2-3 and C3-4, and 3 to 4 mm grade 1 anterolisthesis of C7 upon C1, also degenerative, all stable.  No new alignment abnormality or traumatic malalignment is seen. Bone on bone anterior atlanto-dental joint space loss and bone on bone C1 C2 lateral mass articulation joint loss, with asymmetric bulky hypertrophic arthropathy of the left lateral mass articulation are also noted. DEGENERATIVE CHANGES: Degenerative subcortical cystic change and sclerosis of the dens, chronic discogenic endplate sclerosis opposite C6-C7, and ankylosed hypertrophic facet arthropathy bilaterally at C2-C4 and on the right at C5-6. SABRA The cervical discs are diffusely degenerated except for C2-C3, which is normal in height. The other discs are collapsed with varying degrees of partial interbody ankylosis. Bidirectional endplate osteophytes are seen from C2-3 through C7-T1, with mild effacement of the ventral cord surface secondary 2 posterior osteophytes at C4-5, C5-6, and C6-7, and additional hypertrophic dorsal ligamentous ossification present C6-C7. No levels show frank cord compression. Diffuse hypertrophic facet arthropathy, multilevel uncinate osteophytes, and multilevel severe acquired foraminal stenosis are seen, similar to the MRI in 2021. SOFT TISSUES: No prevertebral soft tissue swelling. VASCULATURE: Calcific plaques are seen at both carotid bifurcations. LUNGS: Asymmetric left greater than right pleuroparenchymal scarlike opacity is seen in the lung apices. THYROID : A 2.2 cm hypodense solid nodule is seen in the right lobe of the thyroid  gland. Further evaluation should only be considered taking into account advanced age and life expectancy. IMPRESSION: 1. No acute intracranial CT findings  or depressed skull fractures. Chronic changes. 2. No acute fracture or traumatic malalignment of the cervical spine. 3. Multilevel severe cervical foraminal stenosis, and cervical spondylosis without cord compression, similar to prior MRI in 2021. 4. Right thyroid  lobe solid nodule measuring 2.2 cm; ordinarily would recommend dedicated thyroid   ultrasound for characterization. In this case, further evaluation should take into account advanced age and life expectancy. 5. Carotid atherosclerosis. Electronically signed by: Francis Quam MD 10/08/2024 08:51 PM EDT RP Workstation: [id number]    with contrast for PE 10/08/2024 06:19:47 PM TECHNIQUE: CTA of the chest was performed after the administration of intravenous contrast. Multiplanar reformatted images are provided for review. MIP images are provided for review. Automated exposure control, iterative reconstruction, and/or weight based adjustment of the mA/kV was utilized to reduce the radiation dose to as low as reasonably achievable. COMPARISON: Chest x-ray 10/08/2024. CLINICAL HISTORY: Pulmonary embolism (PE) suspected, high prob. Pt was sent to ED by Dr. Nichole (PCP) for pneumonia and UTI. Clemens a few nights ago and increased confusion. She lives at [facility] and was seen by staff night of fall. Reports some soreness across hips and shoulders from. Denies LOC and was able to scoot on rear to call for help. FINDINGS: PULMONARY ARTERIES: Pulmonary arteries are adequately opacified for evaluation. There is a single small non-occlusive pulmonary embolism within right lower lobe lobar branch. Additional significant findings include vessel cutoff and no pulmonary arteries identified in the anterior left upper lobe, although no definitive thrombus identified. Main pulmonary artery is normal in caliber. MEDIASTINUM: Heart is mildly enlarged. The pericardium demonstrates no acute abnormality. There are atherosclerotic calcifications of the aorta. There is a hypodense right thyroid  nodule measuring 18 mm. There is an enlarged subcarinal lymph node measuring 13 mm short axis. LYMPH NODES: Enlarged subcarinal lymph node measuring 13 mm short axis. No hilar or axillary lymphadenopathy. LUNGS AND PLEURA: There are trace bilateral pleural effusions. No pneumothorax. There is airspace consolidation within the  inferior left lower lobe and lingula. Additional patchy smaller multifocal areas of airspace consolidation seen centrally and peripherally within the left upper lobe. Multifocal ground-glass opacities are seen peripherally in the right upper lobe. Minimal emphysema present. UPPER ABDOMEN: Limited images of the upper abdomen are unremarkable. SOFT TISSUES AND BONES: L1 chronic-appearing compression deformity. No acute soft tissue abnormality. IMPRESSION: 1. Single small non-occlusive pulmonary embolism within the right lower lobe lobar branch. 2. No pulmonary arteries identified in the anterior left upper lobe, although no definitive thrombus identified; correlate clinically and consider follow-up imaging if concern persists. 3. Right heart strain with rv/lv ratio 1.2. 4. Multifocal ground-glass and airspace opacities are seen throughout both lungs, left greater than right. The most significant consolidation is in the left lower lobe. 5. Enlarged subcarinal lymph node measuring 13 mm short axis; indeterminate and may be reactive. 6. Trace bilateral pleural effusions. 7. Incidental right thyroid  nodule measuring 18 mm. Per ACR guidelines for patients 35 years, recommend non-emergent thyroid  ultrasound. Electronically signed by: Greig Pique MD 10/08/2024 06:37 PM EDT RP Workstation: [id number]   CT ABDOMEN PELVIS W CONTRAST Result Date: 10/08/2024 EXAM: CT ABDOMEN AND PELVIS WITH CONTRAST 10/08/2024 06:19:47 PM TECHNIQUE: CT of the abdomen and pelvis was performed with the administration of 75 mL of iohexol (OMNIPAQUE) 350 MG/ML injection. Multiplanar reformatted images are provided for review. Automated exposure control, iterative reconstruction, and/or weight-based adjustment of the mA/kV was utilized to reduce the radiation dose to as low as reasonably achievable. COMPARISON: None available. CLINICAL HISTORY:  Abdominal pain, acute, nonlocalized. Pt was sent to ED by Dr. Nichole (PCP) for pneumonia and UTI. Clemens  a few nights ago and increased confusion. She lives at [facility] and was seen by staff night of fall. Reports some soreness across hips and shoulders from. Denies LOC and was able to scoot on rear to call for help. FINDINGS: LOWER CHEST: Bronchiectasis and chronic lung changes. Consolidative process in the left lower lobe. See separately dictated chest CT. LIVER: The liver is unremarkable. GALLBLADDER AND BILE DUCTS: Gallbladder is unremarkable. No calcified gallstone. Prominent common bile duct measuring up to 10 mm. SPLEEN: Splenic granuloma. PANCREAS: Atrophic pancreas without inflammation. Proximal pancreatic duct slightly dilated up to 4 mm. ADRENAL GLANDS: No acute abnormality. KIDNEYS, URETERS AND BLADDER: No stones in the kidneys or ureters. No hydronephrosis. No perinephric or periureteral stranding. Bladder partially obscured by artifact from right hip hardware. GI AND BOWEL: Stomach demonstrates no acute abnormality. Under distention versus mild colitis type changes of the transverse colon and splenic flexure. Diverticular disease of the sigmoid colon without acute wall thickening. There is no bowel obstruction. PERITONEUM AND RETROPERITONEUM: No ascites. No free air. VASCULATURE: Aorta is normal in caliber. Advanced aortic atherosclerosis without aneurysm. LYMPH NODES: No lymphadenopathy. REPRODUCTIVE ORGANS: Hysterectomy. No adnexal mass. BONES AND SOFT TISSUES: Right hip replacement with artifact. No acute osseous abnormality. Scoliosis and degenerative changes of the spine. Chronic-appearing superior endplate deformities at L1 and L2. No focal soft tissue abnormality. IMPRESSION: 1. Consolidative process in the left lower lobe with small pleural effusions, reference previously dictated chest CT. 2. Collapsed appearance versus Mild colitis-type changes involving the transverse colon and splenic flexure. 3. Sigmoid diverticulosis without acute inflammatory wall thickening. 4. Prominent common  bile duct measuring up to 10 mm, with slight prominence of the pancreatic duct, correlate with liver enzymes with follow-up MRCP if deemed appropriate. 5. Chronic-appearing superior endplate deformities at L1 and L2 Electronically signed by: Luke Bun MD 10/08/2024 06:55 PM EDT RP Workstation: [id number]   DG Chest 2 View Result Date: 10/08/2024 EXAM: 2 VIEW(S) XRAY OF THE CHEST 10/08/2024 04:21:24 PM COMPARISON: 02/24/2023 CLINICAL HISTORY: penumonia. Per chart - Pt was sent to ED by Dr. Nichole (PCP) for pneumonia and UTI. Clemens a few nights ago and increased confusion. She lives at [facility] and was seen by staff night of fall. Reports some soreness across hips and shoulders from. Denies LOC ; and was able to scoot on rear to call for help. FINDINGS: LUNGS AND PLEURA: Stable reticular densities are noted throughout both lungs most consistent with scarring. No pulmonary edema. No pleural effusion. No pneumothorax. HEART AND MEDIASTINUM: No acute abnormality of the cardiac and mediastinal silhouettes. BONES AND SOFT TISSUES: No acute osseous abnormality. IMPRESSION: 1. Stable reticular densities throughout both lungs, most consistent with scarring. Electronically signed by: Lynwood Seip MD 10/08/2024 04:34 PM EDT RP Workstation: [id number]    Assessment/Plan 1. Acute pulmonary embolism Patient is on Eliquis Discussed with the daughter about the risks of bleeding with falls.  2. Sepsis due to pneumonia (HCC) Most of her symptoms are now resolved  3. Transaminates with CBD dilatation Again discussed with the daughter.  Patient is asymptomatic The daughter will discussed with her PCP if they want to go and see GI for further workup.   I agree due to her age and frailty no further Work up at this time   4. Thyroid  nodule Again discussed with the daughter  would not order ultrasound.  Patient will discuss this with her PCP as outpatient if they want to pursue any further workup  5.  Colitis Her symptoms are now resolved.  C. difficile was ruled out in the hospital  6. Generalized anxiety disorder Continue on Celexa   7. Insomnia, unspecified type Patient refused to DC the Unisom which was restarted.  Will make her Ambien  PRn 8 Left hip pain Mostly resolved CT scan was negative will DC her hydrocodone   9 patient is adamant that she wants to go back to her apartment Discussed with the daughter and the social worker.  She will be assessed by the therapy.  Most likely will be discharged next week  Family/ staff Communication:   Labs/tests ordered:

## 2024-10-21 DIAGNOSIS — Z9181 History of falling: Secondary | ICD-10-CM | POA: Diagnosis not present

## 2024-10-21 DIAGNOSIS — M25552 Pain in left hip: Secondary | ICD-10-CM | POA: Diagnosis not present

## 2024-10-21 DIAGNOSIS — R2681 Unsteadiness on feet: Secondary | ICD-10-CM | POA: Diagnosis not present

## 2024-10-21 DIAGNOSIS — M6281 Muscle weakness (generalized): Secondary | ICD-10-CM | POA: Diagnosis not present

## 2024-10-21 DIAGNOSIS — J189 Pneumonia, unspecified organism: Secondary | ICD-10-CM | POA: Diagnosis not present

## 2024-10-22 DIAGNOSIS — R2681 Unsteadiness on feet: Secondary | ICD-10-CM | POA: Diagnosis not present

## 2024-10-22 DIAGNOSIS — M25552 Pain in left hip: Secondary | ICD-10-CM | POA: Diagnosis not present

## 2024-10-22 DIAGNOSIS — M6281 Muscle weakness (generalized): Secondary | ICD-10-CM | POA: Diagnosis not present

## 2024-10-22 DIAGNOSIS — J189 Pneumonia, unspecified organism: Secondary | ICD-10-CM | POA: Diagnosis not present

## 2024-10-23 DIAGNOSIS — Z9181 History of falling: Secondary | ICD-10-CM | POA: Diagnosis not present

## 2024-10-23 DIAGNOSIS — J189 Pneumonia, unspecified organism: Secondary | ICD-10-CM | POA: Diagnosis not present

## 2024-10-23 DIAGNOSIS — M25552 Pain in left hip: Secondary | ICD-10-CM | POA: Diagnosis not present

## 2024-10-23 DIAGNOSIS — R1312 Dysphagia, oropharyngeal phase: Secondary | ICD-10-CM | POA: Diagnosis not present

## 2024-10-23 DIAGNOSIS — R41841 Cognitive communication deficit: Secondary | ICD-10-CM | POA: Diagnosis not present

## 2024-10-23 DIAGNOSIS — M6281 Muscle weakness (generalized): Secondary | ICD-10-CM | POA: Diagnosis not present

## 2024-10-23 DIAGNOSIS — I2601 Septic pulmonary embolism with acute cor pulmonale: Secondary | ICD-10-CM | POA: Diagnosis not present

## 2024-10-23 DIAGNOSIS — R2681 Unsteadiness on feet: Secondary | ICD-10-CM | POA: Diagnosis not present

## 2024-10-24 DIAGNOSIS — M6281 Muscle weakness (generalized): Secondary | ICD-10-CM | POA: Diagnosis not present

## 2024-10-24 DIAGNOSIS — Z9181 History of falling: Secondary | ICD-10-CM | POA: Diagnosis not present

## 2024-10-24 DIAGNOSIS — I2601 Septic pulmonary embolism with acute cor pulmonale: Secondary | ICD-10-CM | POA: Diagnosis not present

## 2024-10-24 DIAGNOSIS — R41841 Cognitive communication deficit: Secondary | ICD-10-CM | POA: Diagnosis not present

## 2024-10-24 DIAGNOSIS — R1312 Dysphagia, oropharyngeal phase: Secondary | ICD-10-CM | POA: Diagnosis not present

## 2024-10-24 DIAGNOSIS — M25552 Pain in left hip: Secondary | ICD-10-CM | POA: Diagnosis not present

## 2024-10-24 DIAGNOSIS — R2681 Unsteadiness on feet: Secondary | ICD-10-CM | POA: Diagnosis not present

## 2024-10-24 DIAGNOSIS — J189 Pneumonia, unspecified organism: Secondary | ICD-10-CM | POA: Diagnosis not present

## 2024-10-27 DIAGNOSIS — M25552 Pain in left hip: Secondary | ICD-10-CM | POA: Diagnosis not present

## 2024-10-27 DIAGNOSIS — J189 Pneumonia, unspecified organism: Secondary | ICD-10-CM | POA: Diagnosis not present

## 2024-10-27 DIAGNOSIS — R2681 Unsteadiness on feet: Secondary | ICD-10-CM | POA: Diagnosis not present

## 2024-10-27 DIAGNOSIS — M6281 Muscle weakness (generalized): Secondary | ICD-10-CM | POA: Diagnosis not present

## 2024-10-28 DIAGNOSIS — I2601 Septic pulmonary embolism with acute cor pulmonale: Secondary | ICD-10-CM | POA: Diagnosis not present

## 2024-10-28 DIAGNOSIS — M25552 Pain in left hip: Secondary | ICD-10-CM | POA: Diagnosis not present

## 2024-10-28 DIAGNOSIS — R41841 Cognitive communication deficit: Secondary | ICD-10-CM | POA: Diagnosis not present

## 2024-10-28 DIAGNOSIS — M6281 Muscle weakness (generalized): Secondary | ICD-10-CM | POA: Diagnosis not present

## 2024-10-28 DIAGNOSIS — R1312 Dysphagia, oropharyngeal phase: Secondary | ICD-10-CM | POA: Diagnosis not present

## 2024-10-28 DIAGNOSIS — J189 Pneumonia, unspecified organism: Secondary | ICD-10-CM | POA: Diagnosis not present

## 2024-10-28 DIAGNOSIS — R2681 Unsteadiness on feet: Secondary | ICD-10-CM | POA: Diagnosis not present

## 2024-10-29 DIAGNOSIS — I2601 Septic pulmonary embolism with acute cor pulmonale: Secondary | ICD-10-CM | POA: Diagnosis not present

## 2024-10-29 DIAGNOSIS — R1312 Dysphagia, oropharyngeal phase: Secondary | ICD-10-CM | POA: Diagnosis not present

## 2024-10-29 DIAGNOSIS — R41841 Cognitive communication deficit: Secondary | ICD-10-CM | POA: Diagnosis not present

## 2024-10-29 DIAGNOSIS — J189 Pneumonia, unspecified organism: Secondary | ICD-10-CM | POA: Diagnosis not present

## 2024-10-30 DIAGNOSIS — R2681 Unsteadiness on feet: Secondary | ICD-10-CM | POA: Diagnosis not present

## 2024-10-30 DIAGNOSIS — M25552 Pain in left hip: Secondary | ICD-10-CM | POA: Diagnosis not present

## 2024-10-30 DIAGNOSIS — M6281 Muscle weakness (generalized): Secondary | ICD-10-CM | POA: Diagnosis not present

## 2024-10-30 DIAGNOSIS — J189 Pneumonia, unspecified organism: Secondary | ICD-10-CM | POA: Diagnosis not present

## 2024-10-31 DIAGNOSIS — M25552 Pain in left hip: Secondary | ICD-10-CM | POA: Diagnosis not present

## 2024-10-31 DIAGNOSIS — M6281 Muscle weakness (generalized): Secondary | ICD-10-CM | POA: Diagnosis not present

## 2024-10-31 DIAGNOSIS — R41841 Cognitive communication deficit: Secondary | ICD-10-CM | POA: Diagnosis not present

## 2024-10-31 DIAGNOSIS — R2681 Unsteadiness on feet: Secondary | ICD-10-CM | POA: Diagnosis not present

## 2024-10-31 DIAGNOSIS — J189 Pneumonia, unspecified organism: Secondary | ICD-10-CM | POA: Diagnosis not present

## 2024-11-01 DIAGNOSIS — M25552 Pain in left hip: Secondary | ICD-10-CM | POA: Diagnosis not present

## 2024-11-01 DIAGNOSIS — M6281 Muscle weakness (generalized): Secondary | ICD-10-CM | POA: Diagnosis not present

## 2024-11-01 DIAGNOSIS — J189 Pneumonia, unspecified organism: Secondary | ICD-10-CM | POA: Diagnosis not present

## 2024-11-01 DIAGNOSIS — R2681 Unsteadiness on feet: Secondary | ICD-10-CM | POA: Diagnosis not present

## 2024-11-03 DIAGNOSIS — M25552 Pain in left hip: Secondary | ICD-10-CM | POA: Diagnosis not present

## 2024-11-03 DIAGNOSIS — J189 Pneumonia, unspecified organism: Secondary | ICD-10-CM | POA: Diagnosis not present

## 2024-11-03 DIAGNOSIS — R2681 Unsteadiness on feet: Secondary | ICD-10-CM | POA: Diagnosis not present

## 2024-11-03 DIAGNOSIS — M6281 Muscle weakness (generalized): Secondary | ICD-10-CM | POA: Diagnosis not present

## 2024-11-04 DIAGNOSIS — M6281 Muscle weakness (generalized): Secondary | ICD-10-CM | POA: Diagnosis not present

## 2024-11-04 DIAGNOSIS — I2601 Septic pulmonary embolism with acute cor pulmonale: Secondary | ICD-10-CM | POA: Diagnosis not present

## 2024-11-04 DIAGNOSIS — R2681 Unsteadiness on feet: Secondary | ICD-10-CM | POA: Diagnosis not present

## 2024-11-04 DIAGNOSIS — M25552 Pain in left hip: Secondary | ICD-10-CM | POA: Diagnosis not present

## 2024-11-04 DIAGNOSIS — J189 Pneumonia, unspecified organism: Secondary | ICD-10-CM | POA: Diagnosis not present

## 2024-11-04 DIAGNOSIS — R1312 Dysphagia, oropharyngeal phase: Secondary | ICD-10-CM | POA: Diagnosis not present

## 2024-11-04 DIAGNOSIS — Z9181 History of falling: Secondary | ICD-10-CM | POA: Diagnosis not present

## 2024-11-04 DIAGNOSIS — R41841 Cognitive communication deficit: Secondary | ICD-10-CM | POA: Diagnosis not present

## 2024-11-05 DIAGNOSIS — M25552 Pain in left hip: Secondary | ICD-10-CM | POA: Diagnosis not present

## 2024-11-05 DIAGNOSIS — J189 Pneumonia, unspecified organism: Secondary | ICD-10-CM | POA: Diagnosis not present

## 2024-11-05 DIAGNOSIS — M6281 Muscle weakness (generalized): Secondary | ICD-10-CM | POA: Diagnosis not present

## 2024-11-05 DIAGNOSIS — I2601 Septic pulmonary embolism with acute cor pulmonale: Secondary | ICD-10-CM | POA: Diagnosis not present

## 2024-11-05 DIAGNOSIS — R41841 Cognitive communication deficit: Secondary | ICD-10-CM | POA: Diagnosis not present

## 2024-11-05 DIAGNOSIS — R1312 Dysphagia, oropharyngeal phase: Secondary | ICD-10-CM | POA: Diagnosis not present

## 2024-11-05 DIAGNOSIS — R2681 Unsteadiness on feet: Secondary | ICD-10-CM | POA: Diagnosis not present

## 2024-11-06 DIAGNOSIS — M6281 Muscle weakness (generalized): Secondary | ICD-10-CM | POA: Diagnosis not present

## 2024-11-06 DIAGNOSIS — Z9181 History of falling: Secondary | ICD-10-CM | POA: Diagnosis not present

## 2024-11-06 DIAGNOSIS — M25552 Pain in left hip: Secondary | ICD-10-CM | POA: Diagnosis not present

## 2024-11-06 DIAGNOSIS — J189 Pneumonia, unspecified organism: Secondary | ICD-10-CM | POA: Diagnosis not present

## 2024-11-07 DIAGNOSIS — J189 Pneumonia, unspecified organism: Secondary | ICD-10-CM | POA: Diagnosis not present

## 2024-11-07 DIAGNOSIS — R1312 Dysphagia, oropharyngeal phase: Secondary | ICD-10-CM | POA: Diagnosis not present

## 2024-11-07 DIAGNOSIS — I2601 Septic pulmonary embolism with acute cor pulmonale: Secondary | ICD-10-CM | POA: Diagnosis not present

## 2024-11-07 DIAGNOSIS — R41841 Cognitive communication deficit: Secondary | ICD-10-CM | POA: Diagnosis not present

## 2024-11-08 DIAGNOSIS — R2681 Unsteadiness on feet: Secondary | ICD-10-CM | POA: Diagnosis not present

## 2024-11-08 DIAGNOSIS — M25552 Pain in left hip: Secondary | ICD-10-CM | POA: Diagnosis not present

## 2024-11-08 DIAGNOSIS — J189 Pneumonia, unspecified organism: Secondary | ICD-10-CM | POA: Diagnosis not present

## 2024-11-08 DIAGNOSIS — M6281 Muscle weakness (generalized): Secondary | ICD-10-CM | POA: Diagnosis not present

## 2024-11-10 DIAGNOSIS — M25552 Pain in left hip: Secondary | ICD-10-CM | POA: Diagnosis not present

## 2024-11-10 DIAGNOSIS — J189 Pneumonia, unspecified organism: Secondary | ICD-10-CM | POA: Diagnosis not present

## 2024-11-10 DIAGNOSIS — R2681 Unsteadiness on feet: Secondary | ICD-10-CM | POA: Diagnosis not present

## 2024-11-10 DIAGNOSIS — M6281 Muscle weakness (generalized): Secondary | ICD-10-CM | POA: Diagnosis not present

## 2024-11-11 DIAGNOSIS — R41841 Cognitive communication deficit: Secondary | ICD-10-CM | POA: Diagnosis not present

## 2024-11-11 DIAGNOSIS — R1312 Dysphagia, oropharyngeal phase: Secondary | ICD-10-CM | POA: Diagnosis not present

## 2024-11-11 DIAGNOSIS — J189 Pneumonia, unspecified organism: Secondary | ICD-10-CM | POA: Diagnosis not present

## 2024-11-11 DIAGNOSIS — I2601 Septic pulmonary embolism with acute cor pulmonale: Secondary | ICD-10-CM | POA: Diagnosis not present

## 2024-11-12 DIAGNOSIS — R1312 Dysphagia, oropharyngeal phase: Secondary | ICD-10-CM | POA: Diagnosis not present

## 2024-11-12 DIAGNOSIS — I2601 Septic pulmonary embolism with acute cor pulmonale: Secondary | ICD-10-CM | POA: Diagnosis not present

## 2024-11-12 DIAGNOSIS — J189 Pneumonia, unspecified organism: Secondary | ICD-10-CM | POA: Diagnosis not present

## 2024-11-12 DIAGNOSIS — R41841 Cognitive communication deficit: Secondary | ICD-10-CM | POA: Diagnosis not present

## 2024-11-14 DIAGNOSIS — R1312 Dysphagia, oropharyngeal phase: Secondary | ICD-10-CM | POA: Diagnosis not present

## 2024-11-14 DIAGNOSIS — J189 Pneumonia, unspecified organism: Secondary | ICD-10-CM | POA: Diagnosis not present

## 2024-11-14 DIAGNOSIS — I2601 Septic pulmonary embolism with acute cor pulmonale: Secondary | ICD-10-CM | POA: Diagnosis not present

## 2024-11-14 DIAGNOSIS — R41841 Cognitive communication deficit: Secondary | ICD-10-CM | POA: Diagnosis not present

## 2024-11-17 DIAGNOSIS — M6281 Muscle weakness (generalized): Secondary | ICD-10-CM | POA: Diagnosis not present

## 2024-11-17 DIAGNOSIS — M25552 Pain in left hip: Secondary | ICD-10-CM | POA: Diagnosis not present

## 2024-11-17 DIAGNOSIS — R2681 Unsteadiness on feet: Secondary | ICD-10-CM | POA: Diagnosis not present

## 2024-11-17 DIAGNOSIS — J189 Pneumonia, unspecified organism: Secondary | ICD-10-CM | POA: Diagnosis not present

## 2024-11-18 DIAGNOSIS — I2601 Septic pulmonary embolism with acute cor pulmonale: Secondary | ICD-10-CM | POA: Diagnosis not present

## 2024-11-18 DIAGNOSIS — R2681 Unsteadiness on feet: Secondary | ICD-10-CM | POA: Diagnosis not present

## 2024-11-18 DIAGNOSIS — M25552 Pain in left hip: Secondary | ICD-10-CM | POA: Diagnosis not present

## 2024-11-18 DIAGNOSIS — J189 Pneumonia, unspecified organism: Secondary | ICD-10-CM | POA: Diagnosis not present

## 2024-11-18 DIAGNOSIS — R1312 Dysphagia, oropharyngeal phase: Secondary | ICD-10-CM | POA: Diagnosis not present

## 2024-11-18 DIAGNOSIS — R41841 Cognitive communication deficit: Secondary | ICD-10-CM | POA: Diagnosis not present

## 2024-11-18 DIAGNOSIS — M6281 Muscle weakness (generalized): Secondary | ICD-10-CM | POA: Diagnosis not present

## 2024-11-18 DIAGNOSIS — Z9181 History of falling: Secondary | ICD-10-CM | POA: Diagnosis not present

## 2024-11-19 DIAGNOSIS — R41841 Cognitive communication deficit: Secondary | ICD-10-CM | POA: Diagnosis not present

## 2024-11-19 DIAGNOSIS — J189 Pneumonia, unspecified organism: Secondary | ICD-10-CM | POA: Diagnosis not present

## 2024-11-19 DIAGNOSIS — I2601 Septic pulmonary embolism with acute cor pulmonale: Secondary | ICD-10-CM | POA: Diagnosis not present

## 2024-11-19 DIAGNOSIS — R1312 Dysphagia, oropharyngeal phase: Secondary | ICD-10-CM | POA: Diagnosis not present

## 2024-11-20 DIAGNOSIS — J189 Pneumonia, unspecified organism: Secondary | ICD-10-CM | POA: Diagnosis not present

## 2024-11-20 DIAGNOSIS — M6281 Muscle weakness (generalized): Secondary | ICD-10-CM | POA: Diagnosis not present

## 2024-11-20 DIAGNOSIS — Z9181 History of falling: Secondary | ICD-10-CM | POA: Diagnosis not present

## 2024-11-20 DIAGNOSIS — R2681 Unsteadiness on feet: Secondary | ICD-10-CM | POA: Diagnosis not present

## 2024-11-20 DIAGNOSIS — M25552 Pain in left hip: Secondary | ICD-10-CM | POA: Diagnosis not present

## 2024-11-21 DIAGNOSIS — M6281 Muscle weakness (generalized): Secondary | ICD-10-CM | POA: Diagnosis not present

## 2024-11-21 DIAGNOSIS — M25552 Pain in left hip: Secondary | ICD-10-CM | POA: Diagnosis not present

## 2024-11-21 DIAGNOSIS — R1312 Dysphagia, oropharyngeal phase: Secondary | ICD-10-CM | POA: Diagnosis not present

## 2024-11-21 DIAGNOSIS — R41841 Cognitive communication deficit: Secondary | ICD-10-CM | POA: Diagnosis not present

## 2024-11-21 DIAGNOSIS — J189 Pneumonia, unspecified organism: Secondary | ICD-10-CM | POA: Diagnosis not present

## 2024-11-21 DIAGNOSIS — R2681 Unsteadiness on feet: Secondary | ICD-10-CM | POA: Diagnosis not present

## 2024-11-21 DIAGNOSIS — I2601 Septic pulmonary embolism with acute cor pulmonale: Secondary | ICD-10-CM | POA: Diagnosis not present

## 2024-11-22 DIAGNOSIS — M6281 Muscle weakness (generalized): Secondary | ICD-10-CM | POA: Diagnosis not present

## 2024-11-22 DIAGNOSIS — J189 Pneumonia, unspecified organism: Secondary | ICD-10-CM | POA: Diagnosis not present

## 2024-11-22 DIAGNOSIS — M25552 Pain in left hip: Secondary | ICD-10-CM | POA: Diagnosis not present

## 2024-11-22 DIAGNOSIS — Z9181 History of falling: Secondary | ICD-10-CM | POA: Diagnosis not present

## 2024-11-22 DIAGNOSIS — R2681 Unsteadiness on feet: Secondary | ICD-10-CM | POA: Diagnosis not present

## 2024-11-23 DIAGNOSIS — M25552 Pain in left hip: Secondary | ICD-10-CM | POA: Diagnosis not present

## 2024-11-23 DIAGNOSIS — R2681 Unsteadiness on feet: Secondary | ICD-10-CM | POA: Diagnosis not present

## 2024-11-23 DIAGNOSIS — J189 Pneumonia, unspecified organism: Secondary | ICD-10-CM | POA: Diagnosis not present

## 2024-11-23 DIAGNOSIS — M6281 Muscle weakness (generalized): Secondary | ICD-10-CM | POA: Diagnosis not present

## 2024-11-25 DIAGNOSIS — J189 Pneumonia, unspecified organism: Secondary | ICD-10-CM | POA: Diagnosis not present

## 2024-11-25 DIAGNOSIS — I2601 Septic pulmonary embolism with acute cor pulmonale: Secondary | ICD-10-CM | POA: Diagnosis not present

## 2024-11-25 DIAGNOSIS — M25552 Pain in left hip: Secondary | ICD-10-CM | POA: Diagnosis not present

## 2024-11-25 DIAGNOSIS — M6281 Muscle weakness (generalized): Secondary | ICD-10-CM | POA: Diagnosis not present

## 2024-11-25 DIAGNOSIS — R1312 Dysphagia, oropharyngeal phase: Secondary | ICD-10-CM | POA: Diagnosis not present

## 2024-11-25 DIAGNOSIS — R2681 Unsteadiness on feet: Secondary | ICD-10-CM | POA: Diagnosis not present

## 2024-11-25 DIAGNOSIS — R41841 Cognitive communication deficit: Secondary | ICD-10-CM | POA: Diagnosis not present

## 2024-11-26 DIAGNOSIS — Z9181 History of falling: Secondary | ICD-10-CM | POA: Diagnosis not present

## 2024-11-26 DIAGNOSIS — I2601 Septic pulmonary embolism with acute cor pulmonale: Secondary | ICD-10-CM | POA: Diagnosis not present

## 2024-11-26 DIAGNOSIS — R41841 Cognitive communication deficit: Secondary | ICD-10-CM | POA: Diagnosis not present

## 2024-11-26 DIAGNOSIS — M25552 Pain in left hip: Secondary | ICD-10-CM | POA: Diagnosis not present

## 2024-11-26 DIAGNOSIS — M6281 Muscle weakness (generalized): Secondary | ICD-10-CM | POA: Diagnosis not present

## 2024-11-26 DIAGNOSIS — J189 Pneumonia, unspecified organism: Secondary | ICD-10-CM | POA: Diagnosis not present

## 2024-11-26 DIAGNOSIS — R2681 Unsteadiness on feet: Secondary | ICD-10-CM | POA: Diagnosis not present

## 2024-11-26 DIAGNOSIS — R1312 Dysphagia, oropharyngeal phase: Secondary | ICD-10-CM | POA: Diagnosis not present

## 2024-11-27 DIAGNOSIS — Z9181 History of falling: Secondary | ICD-10-CM | POA: Diagnosis not present

## 2024-11-27 DIAGNOSIS — J189 Pneumonia, unspecified organism: Secondary | ICD-10-CM | POA: Diagnosis not present

## 2024-11-27 DIAGNOSIS — M25552 Pain in left hip: Secondary | ICD-10-CM | POA: Diagnosis not present

## 2024-11-27 DIAGNOSIS — M6281 Muscle weakness (generalized): Secondary | ICD-10-CM | POA: Diagnosis not present

## 2024-11-28 DIAGNOSIS — R41841 Cognitive communication deficit: Secondary | ICD-10-CM | POA: Diagnosis not present

## 2024-11-28 DIAGNOSIS — R2681 Unsteadiness on feet: Secondary | ICD-10-CM | POA: Diagnosis not present

## 2024-11-28 DIAGNOSIS — J189 Pneumonia, unspecified organism: Secondary | ICD-10-CM | POA: Diagnosis not present

## 2024-11-28 DIAGNOSIS — I2601 Septic pulmonary embolism with acute cor pulmonale: Secondary | ICD-10-CM | POA: Diagnosis not present

## 2024-11-28 DIAGNOSIS — M25552 Pain in left hip: Secondary | ICD-10-CM | POA: Diagnosis not present

## 2024-11-28 DIAGNOSIS — R1312 Dysphagia, oropharyngeal phase: Secondary | ICD-10-CM | POA: Diagnosis not present

## 2024-11-28 DIAGNOSIS — M6281 Muscle weakness (generalized): Secondary | ICD-10-CM | POA: Diagnosis not present

## 2024-11-29 DIAGNOSIS — M6281 Muscle weakness (generalized): Secondary | ICD-10-CM | POA: Diagnosis not present

## 2024-11-29 DIAGNOSIS — Z9181 History of falling: Secondary | ICD-10-CM | POA: Diagnosis not present

## 2024-11-29 DIAGNOSIS — M25552 Pain in left hip: Secondary | ICD-10-CM | POA: Diagnosis not present

## 2024-11-29 DIAGNOSIS — J189 Pneumonia, unspecified organism: Secondary | ICD-10-CM | POA: Diagnosis not present

## 2025-01-02 ENCOUNTER — Encounter: Payer: Self-pay | Admitting: Internal Medicine

## 2025-01-02 NOTE — Progress Notes (Signed)
 A user error has taken place.

## 2025-01-16 ENCOUNTER — Non-Acute Institutional Stay: Payer: Self-pay | Admitting: Internal Medicine

## 2025-01-16 MED ORDER — APIXABAN 2.5 MG PO TABS: 5.0000 mg | ORAL_TABLET | Freq: Two times a day (BID) | ORAL | Status: AC

## 2025-01-16 NOTE — Progress Notes (Unsigned)
 "  Location:  [facility] of Service:  ALF ([id number])  Provider:   Code Status: *** Goals of Care:     10/14/2024    7:29 AM  Advanced Directives  Would patient like information on creating a medical advance directive? No - Patient declined     Chief Complaint  Patient presents with   Care Management    HPI: Patient is a 89 y.o. female seen today for medical management of chronic diseases.     Past Medical History:  Diagnosis Date   Arthritis    Back pain    Chronic kidney disease, stage 3a (HCC)    Insomnia    Leg pain    Neck pain     No past surgical history on file.  Allergies[1]  Outpatient Encounter Medications as of 01/16/2025  Medication Sig   apixaban  (ELIQUIS ) 2.5 MG TABS tablet Take 2 tablets (5 mg total) by mouth 2 (two) times daily.   acetaminophen  (TYLENOL ) 325 MG tablet Take 2 tablets (650 mg total) by mouth every 6 (six) hours as needed for mild pain (pain score 1-3) or headache (or Fever >/= 101).   albuterol  (PROVENTIL ) (2.5 MG/3ML) 0.083% nebulizer solution Take 3 mLs (2.5 mg total) by nebulization every 6 (six) hours as needed for wheezing or shortness of breath.   AZO CRANBERRY GUMMIES PO Take 2 Pieces by mouth See admin instructions. Chew 2 gummies by mouth in the morning   calcium carbonate (OSCAL) 1500 (600 Ca) MG TABS tablet Take 1,500 mg by mouth daily with breakfast.   citalopram  (CELEXA ) 20 MG tablet Take 20 mg by mouth daily.   doxylamine , Sleep, (UNISOM ) 25 MG tablet Take 1 tablet (25 mg total) by mouth at bedtime as needed.   feeding supplement (ENSURE PLUS HIGH PROTEIN) LIQD Take 237 mLs by mouth daily at 2 PM.   Guaifenesin  (MUCINEX  MAXIMUM STRENGTH) 1200 MG TB12 Take 1,200 mg by mouth every 12 (twelve) hours as needed (to loosen phlegm or thin bronchial secretions).   latanoprost  (XALATAN ) 0.005 % ophthalmic solution Place 1 drop into both eyes at bedtime.   ondansetron  (ZOFRAN ) 4 MG tablet Take 1 tablet (4 mg total) by mouth  every 6 (six) hours as needed for nausea.   senna (SENOKOT) 8.6 MG TABS tablet Take 1 tablet (8.6 mg total) by mouth daily.   SYSTANE ULTRA 0.4-0.3 % SOLN Place 1 drop into both eyes 4 (four) times daily as needed (for dryness).   [DISCONTINUED] apixaban  (ELIQUIS ) 5 MG TABS tablet Take 2 tablets (10 mg total) by mouth 2 (two) times daily for 3 days, THEN 1 tablet (5 mg total) 2 (two) times daily.   [DISCONTINUED] Ostomy Supplies (SKIN PREP WIPES) MISC Apply topically. Apply to Heels topically every day and evening shift for Skin care   [DISCONTINUED] zolpidem  (AMBIEN ) 5 MG tablet Take 1 tablet (5 mg total) by mouth at bedtime as needed for sleep.   No facility-administered encounter medications on file as of 01/16/2025.    Review of Systems:  Review of Systems  Health Maintenance  Topic Date Due   Medicare Annual Wellness (AWV)  Never done   Zoster Vaccines- Shingrix  (1 of 2) Never done   Bone Density Scan  Never done   DTaP/Tdap/Td (1 - Tdap) 11/14/2012   Influenza Vaccine  07/12/2024   COVID-19 Vaccine (4 - 2025-26 season) 08/12/2024   Pneumococcal Vaccine: 50+ Years  Completed   Meningococcal B Vaccine  Aged Out  Physical Exam: There were no vitals filed for this visit. There is no height or weight on file to calculate BMI. Physical Exam  Labs reviewed: Basic Metabolic Panel: Recent Labs    10/08/24 2136 10/09/24 0329 10/10/24 0742 10/11/24 0344 10/12/24 0654 10/13/24 0631  NA  --    < > 139 141 140 138  K  --    < > 3.3* 3.6 3.4* 3.7  CL  --    < > 105 106 105 104  CO2  --    < > 20* 24 23 23   GLUCOSE  --    < > 117* 133* 116* 103*  BUN  --    < > 10 9 10 11   CREATININE  --    < > 0.78 0.72 0.71 0.66  CALCIUM  --    < > 9.0 8.9 8.8* 9.0  MG  --   --  2.0 1.7 1.7  --   TSH 2.130  --   --   --   --   --    < > = values in this interval not displayed.   Liver Function Tests: Recent Labs    10/11/24 0344 10/12/24 0654 10/13/24 0631  AST 92* 53* 53*  ALT 73*  59* 58*  ALKPHOS 123 101 100  BILITOT 0.5 0.5 0.5  PROT 5.9* 5.8* 6.3*  ALBUMIN 2.9* 2.7* 3.0*   Recent Labs    10/08/24 1635  LIPASE 16   No results for input(s): AMMONIA in the last 8760 hours. CBC: Recent Labs    10/08/24 1635 10/09/24 0329 10/11/24 0344 10/12/24 0654 10/13/24 0631  WBC 21.7*   < > 14.0* 13.4* 13.8*  NEUTROABS 19.0*  --   --   --   --   HGB 11.3*   < > 10.3* 10.3* 11.1*  HCT 35.3*   < > 32.5* 31.3* 38.1  MCV 101.4*   < > 100.9* 102.0* 112.1*  PLT 291   < > 329 444* 408*   < > = values in this interval not displayed.   Lipid Panel: No results for input(s): CHOL, HDL, LDLCALC, TRIG, CHOLHDL, LDLDIRECT in the last 8760 hours. No results found for: HGBA1C  Procedures since last visit: No results found.  Assessment/Plan There are no diagnoses linked to this encounter.   Labs/tests ordered:  CBC,CMP,TSH, Next appt:  Visit date not found         [1] No Known Allergies  "
